# Patient Record
Sex: Male | Born: 1965 | Race: White | Hispanic: No | Marital: Single | Smoking: Never smoker
Health system: Southern US, Community
[De-identification: ages and names within clinical notes are randomized; demographics above are authoritative.]

## PROBLEM LIST (undated history)

## (undated) DIAGNOSIS — C801 Malignant (primary) neoplasm, unspecified: Secondary | ICD-10-CM

## (undated) DIAGNOSIS — E785 Hyperlipidemia, unspecified: Secondary | ICD-10-CM

## (undated) DIAGNOSIS — J349 Unspecified disorder of nose and nasal sinuses: Secondary | ICD-10-CM

## (undated) DIAGNOSIS — R400 Somnolence: Secondary | ICD-10-CM

## (undated) DIAGNOSIS — R931 Abnormal findings on diagnostic imaging of heart and coronary circulation: Secondary | ICD-10-CM

## (undated) DIAGNOSIS — M169 Osteoarthritis of hip, unspecified: Secondary | ICD-10-CM

## (undated) DIAGNOSIS — F419 Anxiety disorder, unspecified: Secondary | ICD-10-CM

## (undated) DIAGNOSIS — E119 Type 2 diabetes mellitus without complications: Secondary | ICD-10-CM

## (undated) DIAGNOSIS — R42 Dizziness and giddiness: Secondary | ICD-10-CM

## (undated) DIAGNOSIS — R030 Elevated blood-pressure reading, without diagnosis of hypertension: Secondary | ICD-10-CM

## (undated) DIAGNOSIS — E669 Obesity, unspecified: Secondary | ICD-10-CM

## (undated) DIAGNOSIS — J329 Chronic sinusitis, unspecified: Secondary | ICD-10-CM

## (undated) DIAGNOSIS — M1990 Unspecified osteoarthritis, unspecified site: Secondary | ICD-10-CM

## (undated) DIAGNOSIS — H9190 Unspecified hearing loss, unspecified ear: Secondary | ICD-10-CM

## (undated) DIAGNOSIS — K219 Gastro-esophageal reflux disease without esophagitis: Secondary | ICD-10-CM

## (undated) DIAGNOSIS — F32A Depression, unspecified: Secondary | ICD-10-CM

## (undated) DIAGNOSIS — G4719 Other hypersomnia: Secondary | ICD-10-CM

## (undated) DIAGNOSIS — G478 Other sleep disorders: Secondary | ICD-10-CM

## (undated) DIAGNOSIS — J189 Pneumonia, unspecified organism: Secondary | ICD-10-CM

## (undated) DIAGNOSIS — I10 Essential (primary) hypertension: Secondary | ICD-10-CM

## (undated) HISTORY — DX: Unspecified hearing loss, unspecified ear: H91.90

## (undated) HISTORY — DX: Elevated blood-pressure reading, without diagnosis of hypertension: R03.0

## (undated) HISTORY — PX: OTHER SURGICAL HISTORY: SHX169

## (undated) HISTORY — PX: TONSILLECTOMY AND ADENOIDECTOMY: SUR1326

## (undated) HISTORY — DX: Osteoarthritis of hip, unspecified: M16.9

## (undated) HISTORY — DX: Type 2 diabetes mellitus without complications: E11.9

## (undated) HISTORY — DX: Dizziness and giddiness: R42

## (undated) HISTORY — DX: Depression, unspecified: F32.A

## (undated) HISTORY — PX: HAND SURGERY: SHX662

## (undated) HISTORY — DX: Other hypersomnia: G47.19

## (undated) HISTORY — DX: Hyperlipidemia, unspecified: E78.5

## (undated) HISTORY — DX: Somnolence: R40.0

## (undated) HISTORY — DX: Obesity, unspecified: E66.9

## (undated) HISTORY — DX: Other sleep disorders: G47.8

## (undated) HISTORY — DX: Anxiety disorder, unspecified: F41.9

## (undated) HISTORY — DX: Essential (primary) hypertension: I10

## (undated) HISTORY — DX: Abnormal findings on diagnostic imaging of heart and coronary circulation: R93.1

## (undated) HISTORY — DX: Unspecified disorder of nose and nasal sinuses: J34.9

## (undated) HISTORY — DX: Chronic sinusitis, unspecified: J32.9

---

## 2004-02-12 HISTORY — PX: OTHER SURGICAL HISTORY: SHX169

## 2006-01-22 ENCOUNTER — Encounter: Admission: RE | Admit: 2006-01-22 | Discharge: 2006-01-22 | Payer: Self-pay | Admitting: Gastroenterology

## 2007-11-27 DIAGNOSIS — S6291XA Unspecified fracture of right wrist and hand, initial encounter for closed fracture: Secondary | ICD-10-CM | POA: Insufficient documentation

## 2008-12-06 ENCOUNTER — Encounter: Admission: RE | Admit: 2008-12-06 | Discharge: 2008-12-06 | Payer: Self-pay | Admitting: Orthopedic Surgery

## 2009-01-27 ENCOUNTER — Encounter: Admission: RE | Admit: 2009-01-27 | Discharge: 2009-01-27 | Payer: Self-pay | Admitting: Orthopedic Surgery

## 2012-08-21 ENCOUNTER — Other Ambulatory Visit: Payer: Self-pay

## 2012-08-21 ENCOUNTER — Ambulatory Visit
Admission: RE | Admit: 2012-08-21 | Discharge: 2012-08-21 | Disposition: A | Discharge status: Home / Self Care | Payer: 59 | Source: Ambulatory Visit

## 2012-08-21 DIAGNOSIS — M25551 Pain in right hip: Secondary | ICD-10-CM

## 2015-07-25 ENCOUNTER — Telehealth: Payer: Self-pay | Admitting: Cardiology

## 2015-07-25 NOTE — Telephone Encounter (Signed)
Called pt and left message for pt to call back to update Fm and medical Hx. °

## 2015-08-02 NOTE — Progress Notes (Signed)
Cardiology Office Note   Date:  08/03/2015   ID:  Michael Sandoval, DOB [birth year], MRN [id number]  PCP:  London Pepper, MD  Cardiologist:   Minus Breeding, MD   Chief Complaint  Patient presents with  . New Evaluation    neck pain  . Dizziness  . Chest Pain      History of Present Illness: Michael Sandoval is a 50 y.o. male who presents for evaluation of throat discomfort. This has been going on probably for a year or so.  It is a discomfort that is up in his throat. He feels like he is Slight choking sensation. He feels like his head is "ballooning". He might feel his pulse in his neck. It happens at the end of the day when he is tired. Doesn't seem to happen when he is active. He can do things [place] without bringing this on. He's not describing substernal chest pressure but has some tenderness small point in his left upper chest occasionally. He does get short of breath with activities but ascribes this to being out of shape. He's not describing PND or orthopnea. He's not describing palpitations, presyncope or syncope. He's had no weight gain or edema. He's had no prior cardiac workup. He does have significant cardiovascular risk factors.   Past Medical History  Diagnosis Date  . HTN (hypertension)   . Diabetes mellitus, type II ([place])   . Dyslipidemia     Past Surgical History  Procedure Laterality Date  . Hand surgery    . Tonsillectomy and adenoidectomy       Current Outpatient Prescriptions  Medication Sig Dispense Refill  . aspirin 81 MG tablet Take 81 mg by mouth daily.    Marland Kitchen esomeprazole (NEXIUM) 10 MG packet Take 10 mg by mouth daily before breakfast.    . glipiZIDE (GLUCOTROL XL) 10 MG 24 hr tablet Take 10 mg by mouth daily.    Marland Kitchen lisinopril (PRINIVIL,ZESTRIL) 10 MG tablet Take 10 mg by mouth daily.    Marland Kitchen loratadine (CLARITIN) 10 MG tablet Take 10 mg by mouth daily as needed for allergies.    . metFORMIN (GLUCOPHAGE)  1000 MG tablet Take 100 mg by mouth 2 (two) times daily.     No current facility-administered medications for this visit.    Allergies:   Review of patient's allergies indicates no known allergies.    Social History:  The patient  reports that he has never smoked. He has never used smokeless tobacco. He reports that he drinks alcohol. He reports that he does not use illicit drugs.   Family History:  The patient's family history includes Heart attack (age of onset: 28) in his mother; Hypertension in his father.    ROS:  Please see the history of present illness.   Otherwise, review of systems are positive for none.   All other systems are reviewed and negative.    PHYSICAL EXAM: VS:  BP 130/86 mmHg  Pulse 84  Ht 5\' 11"  (1.803 m)  Wt 288 lb (130.636 kg)  BMI 40.19 kg/m2 , BMI Body mass index is 40.19 kg/(m^2). GENERAL:  Well appearing HEENT:  Pupils equal round and reactive, fundi not visualized, oral mucosa unremarkable NECK:  No jugular venous distention, waveform within normal limits, carotid upstroke brisk and symmetric, no bruits, no thyromegaly LYMPHATICS:  No cervical, inguinal adenopathy LUNGS:  Clear to auscultation bilaterally BACK:  No CVA tenderness CHEST:  Unremarkable HEART:  PMI not displaced or sustained,S1 and S2 within normal limits, no S3, no S4, no clicks, no rubs, no murmurs ABD:  Flat, positive bowel sounds normal in frequency in pitch, no bruits, no rebound, no guarding, no midline pulsatile mass, no hepatomegaly, no splenomegaly EXT:  2 plus pulses throughout, no edema, no cyanosis no clubbing SKIN:  No rashes no nodules NEURO:  Cranial nerves II through XII grossly intact, motor grossly intact throughout PSYCH:  Cognitively intact, oriented to person place and time    EKG:  EKG is ordered today. The ekg ordered today demonstrates sinus rhythm, rate 84, axis within normal limits, intervals within normal limits, poor anterior R wave progression, no acute  ST-T wave changes.   Recent Labs: No results found for requested labs within last 365 days.    Lipid Panel No results found for: CHOL, TRIG, HDL, CHOLHDL, VLDL, LDLCALC, LDLDIRECT    Wt Readings from Last 3 Encounters:  08/03/15 288 lb (130.636 kg)      Other studies Reviewed: Additional studies/ records that were reviewed today include: Office records. Review of the above records demonstrates:  Please see elsewhere in the note.     ASSESSMENT AND PLAN:  THROAT PAIN/SOB:  The patient has significant cardiovascular risk factors. I will bring the patient back for a POET (Plain Old Exercise Test). This will allow me to screen for obstructive coronary disease, risk stratify and very importantly provide a prescription for exercise.  I will also order coronary calcium score.  DM: Records reveal that his A1c is 8.7. We talked at length about lifestyle changes for management of this and the importance of continued follow-up.  OBESITY:  The patient understands the need to lose weight with diet and exercise. We have discussed specific strategies for this.  HTN:  The blood pressure is at target. No change in medications is indicated. We will continue with therapeutic lifestyle changes (TLC).  DYSLIPIDEMIA:  I will consider further suggestions about medical therapies for this based on the results of his coronary calcium score. I don't see a lipid profile with his outside records today. Given his diabetes is goal LDL at least less than   Current medicines are reviewed at length with the patient today.  The patient does not have concerns regarding medicines.  The following changes have been made:  no change  Labs/ tests ordered today include:   Orders Placed This Encounter  Procedures  . CT CARDIAC SCORING  . EXERCISE TOLERANCE TEST  . EKG 12-Lead     Disposition:   FU with me as needed.     Signed, Minus Breeding, MD  08/03/2015 8:59 AM    [facility]

## 2015-08-03 ENCOUNTER — Encounter: Payer: Self-pay | Admitting: Cardiology

## 2015-08-03 ENCOUNTER — Ambulatory Visit (INDEPENDENT_AMBULATORY_CARE_PROVIDER_SITE_OTHER): Payer: 59 | Admitting: Cardiology

## 2015-08-03 VITALS — BP 130/86 | HR 84 | Ht 71.0 in | Wt 288.0 lb

## 2015-08-03 DIAGNOSIS — M542 Cervicalgia: Secondary | ICD-10-CM

## 2015-08-03 DIAGNOSIS — R0602 Shortness of breath: Secondary | ICD-10-CM

## 2015-08-03 NOTE — Patient Instructions (Signed)
Medication Instructions:  Continue current medications  Labwork: NONE  Testing/Procedures: Your physician has requested that you have an exercise tolerance test. For further information please visit [email or web address]. Please also follow instruction sheet, as given.  Your physician has requested that you have a Coronary Calcium Score Test this test is done at our [address] office  Follow-Up: Your physician recommends that you schedule a follow-up appointment in: As Needed   Any Other Special Instructions Will Be Listed Below (If Applicable).   If you need a refill on your cardiac medications before your next appointment, please call your pharmacy.

## 2015-08-17 ENCOUNTER — Ambulatory Visit (HOSPITAL_COMMUNITY)
Admission: RE | Admit: 2015-08-17 | Discharge: 2015-08-17 | Disposition: A | Discharge status: Home / Self Care | Payer: 59 | Source: Ambulatory Visit | Attending: Cardiology | Admitting: Cardiology

## 2015-08-17 ENCOUNTER — Ambulatory Visit (INDEPENDENT_AMBULATORY_CARE_PROVIDER_SITE_OTHER)
Admission: RE | Admit: 2015-08-17 | Discharge: 2015-08-17 | Disposition: A | Discharge status: Home / Self Care | Payer: 59 | Source: Ambulatory Visit | Attending: Cardiology | Admitting: Cardiology

## 2015-08-17 DIAGNOSIS — R0602 Shortness of breath: Secondary | ICD-10-CM

## 2015-08-17 LAB — EXERCISE TOLERANCE TEST
Estimated workload: 7 METS
Exercise duration (min): 5 min
Exercise duration (sec): 59 s
MPHR: 168 {beats}/min
Peak HR: 162 {beats}/min
Percent HR: 96 %
Rest HR: 101 {beats}/min

## 2015-09-04 ENCOUNTER — Encounter: Payer: Self-pay | Admitting: Podiatry

## 2015-09-04 ENCOUNTER — Ambulatory Visit (INDEPENDENT_AMBULATORY_CARE_PROVIDER_SITE_OTHER): Payer: 59 | Admitting: Podiatry

## 2015-09-04 VITALS — BP 124/79 | HR 70 | Resp 16 | Ht 71.0 in | Wt 280.0 lb

## 2015-09-04 DIAGNOSIS — B351 Tinea unguium: Secondary | ICD-10-CM

## 2015-09-04 DIAGNOSIS — L600 Ingrowing nail: Secondary | ICD-10-CM

## 2015-09-04 NOTE — Progress Notes (Signed)
   Subjective:    Patient ID: Michael Sandoval, male    DOB: 05-09-1965, 50 y.o.   MRN: [id number]  HPI Chief Complaint  Patient presents with  . Nail Problem    Left foot; great toe; nail discoloration & thickened nail; pt stated, "Toe feels numb like it is asleep"; Pt diabetic type 2; sugar=did not check today; A1C=8.7      Review of Systems  Constitutional: Positive for diaphoresis and fatigue.  HENT: Positive for hearing loss.   All other systems reviewed and are negative.      Objective:   Physical Exam        Assessment & Plan:

## 2015-09-06 NOTE — Progress Notes (Signed)
Subjective:     Patient ID: Michael Sandoval, male   DOB: 30-May-1965, 50 y.o.   MRN: [id number]  HPI patient presents stating she's having trouble with his left big nail and he gets sore and makes it hard for him at times to wear proper shoe gear   Review of Systems  All other systems reviewed and are negative.      Objective:   Physical Exam  Constitutional: He is oriented to person, place, and time.  Cardiovascular: Intact distal pulses.   Musculoskeletal: Normal range of motion.  Neurological: He is oriented to person, place, and time.  Skin: Skin is warm.  Nursing note and vitals reviewed.  neurovascular status found to be intact with muscle strength adequate range of motion within normal limits. Patient's found to have a thickened left hallux nail localized in nature with no indications of proximal spread     Assessment:     Damaged left hallux nail with thickness    Plan:     Advised on debridement of nailbed along with topical medication and reappoint depending on how this does and we may have to consider correction of ingrown toenail

## 2015-09-08 ENCOUNTER — Ambulatory Visit: Payer: 59 | Admitting: Podiatry

## 2016-03-21 DIAGNOSIS — Z7984 Long term (current) use of oral hypoglycemic drugs: Secondary | ICD-10-CM | POA: Diagnosis not present

## 2016-03-21 DIAGNOSIS — Z125 Encounter for screening for malignant neoplasm of prostate: Secondary | ICD-10-CM | POA: Diagnosis not present

## 2016-03-21 DIAGNOSIS — E785 Hyperlipidemia, unspecified: Secondary | ICD-10-CM | POA: Diagnosis not present

## 2016-03-21 DIAGNOSIS — E119 Type 2 diabetes mellitus without complications: Secondary | ICD-10-CM | POA: Diagnosis not present

## 2016-03-22 DIAGNOSIS — Z0000 Encounter for general adult medical examination without abnormal findings: Secondary | ICD-10-CM | POA: Diagnosis not present

## 2016-04-16 DIAGNOSIS — I10 Essential (primary) hypertension: Secondary | ICD-10-CM | POA: Diagnosis not present

## 2016-04-16 DIAGNOSIS — E1165 Type 2 diabetes mellitus with hyperglycemia: Secondary | ICD-10-CM | POA: Diagnosis not present

## 2016-04-16 DIAGNOSIS — E7800 Pure hypercholesterolemia, unspecified: Secondary | ICD-10-CM | POA: Diagnosis not present

## 2016-05-27 DIAGNOSIS — E1165 Type 2 diabetes mellitus with hyperglycemia: Secondary | ICD-10-CM | POA: Diagnosis not present

## 2016-05-27 DIAGNOSIS — I10 Essential (primary) hypertension: Secondary | ICD-10-CM | POA: Diagnosis not present

## 2016-05-27 DIAGNOSIS — E7800 Pure hypercholesterolemia, unspecified: Secondary | ICD-10-CM | POA: Diagnosis not present

## 2016-07-22 DIAGNOSIS — R197 Diarrhea, unspecified: Secondary | ICD-10-CM | POA: Diagnosis not present

## 2016-09-09 DIAGNOSIS — I10 Essential (primary) hypertension: Secondary | ICD-10-CM | POA: Diagnosis not present

## 2016-09-09 DIAGNOSIS — E7800 Pure hypercholesterolemia, unspecified: Secondary | ICD-10-CM | POA: Diagnosis not present

## 2016-09-09 DIAGNOSIS — E1165 Type 2 diabetes mellitus with hyperglycemia: Secondary | ICD-10-CM | POA: Diagnosis not present

## 2016-12-24 DIAGNOSIS — Z23 Encounter for immunization: Secondary | ICD-10-CM | POA: Diagnosis not present

## 2016-12-24 DIAGNOSIS — J329 Chronic sinusitis, unspecified: Secondary | ICD-10-CM | POA: Diagnosis not present

## 2017-01-10 DIAGNOSIS — E1165 Type 2 diabetes mellitus with hyperglycemia: Secondary | ICD-10-CM | POA: Diagnosis not present

## 2017-01-10 DIAGNOSIS — E7800 Pure hypercholesterolemia, unspecified: Secondary | ICD-10-CM | POA: Diagnosis not present

## 2018-03-06 ENCOUNTER — Encounter: Payer: Self-pay | Admitting: Podiatry

## 2018-03-06 ENCOUNTER — Ambulatory Visit (INDEPENDENT_AMBULATORY_CARE_PROVIDER_SITE_OTHER): Payer: 59 | Admitting: Podiatry

## 2018-03-06 VITALS — Resp 16

## 2018-03-06 DIAGNOSIS — L600 Ingrowing nail: Secondary | ICD-10-CM | POA: Diagnosis not present

## 2018-03-06 MED ORDER — NEOMYCIN-POLYMYXIN-HC 3.5-10000-1 OT SOLN: OTIC | 0 refills | Status: DC

## 2018-03-06 NOTE — Progress Notes (Signed)
Subjective:   Patient ID: Michael Sandoval, male   DOB: 53 y.o.   MRN: [id number]   HPI Patient presents stating that his nail has become more chronically ingrown on the side he tries to trim it and over the last month there is been some drainage of pus coming.  He is used peroxide on it and states his sugar has been doing reasonably well and thinks his last A1c was between 8 and 8.5.   ROS      Objective:  Physical Exam  Neurovascular status was found to be intact with patient found to have good digital perfusion and patient does have an incurvated lateral border of the left hallux with thickness of the nail which is been present for a number of years.  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  I did explain procedure and risk and I allowed him to sign consent form and we did discuss healing associated with diabetes and with good circulatory status this should heal over time and again my concerns about him cutting into the nailbed himself.  Patient wants procedure and today I infiltrated the left hallux 60 mg like Marcaine mixture sterile prep applied to the toe and using sterile instrumentation I remove the lateral border exposed matrix and applied phenol 3 applications 30 seconds followed by alcohol lavage and sterile dressing.  Gave instructions on soaks and instructed to leave dressing on for 24 hours but to take it off earlier if it should start to throb.  Wrote prescription for Corticosporin otic solution to use after soaks and instructed to call with any questions and will be checked  back 2 weeks or earlier if needed

## 2018-03-06 NOTE — Patient Instructions (Signed)

## 2018-03-20 ENCOUNTER — Other Ambulatory Visit: Payer: Self-pay

## 2018-03-23 ENCOUNTER — Ambulatory Visit: Payer: Self-pay

## 2018-03-23 DIAGNOSIS — L600 Ingrowing nail: Secondary | ICD-10-CM

## 2018-03-23 NOTE — Patient Instructions (Signed)

## 2018-03-24 NOTE — Progress Notes (Signed)
Patient is here today for follow-up appointment, recent procedure performed on 03/06/2018, removal of ingrown left hallux nail border.  He states that the areas of little sensitive, but overall is not causing him any pain at this time.  No redness, no swelling, no erythema, no drainage, no other signs and symptoms of infection.  The area is scabbing over at this time and healing well.  Discussed signs and symptoms of infection, verbal and written instructions were given to the patient.  He is to follow-up as needed with any acute symptom changes.

## 2018-04-24 DIAGNOSIS — E785 Hyperlipidemia, unspecified: Secondary | ICD-10-CM | POA: Diagnosis not present

## 2018-04-24 DIAGNOSIS — Z0000 Encounter for general adult medical examination without abnormal findings: Secondary | ICD-10-CM | POA: Diagnosis not present

## 2018-04-24 DIAGNOSIS — E1169 Type 2 diabetes mellitus with other specified complication: Secondary | ICD-10-CM | POA: Diagnosis not present

## 2018-04-24 DIAGNOSIS — R42 Dizziness and giddiness: Secondary | ICD-10-CM | POA: Diagnosis not present

## 2018-04-24 DIAGNOSIS — R030 Elevated blood-pressure reading, without diagnosis of hypertension: Secondary | ICD-10-CM | POA: Diagnosis not present

## 2018-04-24 DIAGNOSIS — E1165 Type 2 diabetes mellitus with hyperglycemia: Secondary | ICD-10-CM | POA: Diagnosis not present

## 2018-04-24 DIAGNOSIS — Z125 Encounter for screening for malignant neoplasm of prostate: Secondary | ICD-10-CM | POA: Diagnosis not present

## 2018-04-27 DIAGNOSIS — M1611 Unilateral primary osteoarthritis, right hip: Secondary | ICD-10-CM | POA: Diagnosis not present

## 2018-06-05 DIAGNOSIS — E7800 Pure hypercholesterolemia, unspecified: Secondary | ICD-10-CM | POA: Diagnosis not present

## 2018-06-05 DIAGNOSIS — E1165 Type 2 diabetes mellitus with hyperglycemia: Secondary | ICD-10-CM | POA: Diagnosis not present

## 2018-06-05 DIAGNOSIS — I10 Essential (primary) hypertension: Secondary | ICD-10-CM | POA: Diagnosis not present

## 2018-06-24 NOTE — Progress Notes (Signed)
Need orders for 5-28 surgery

## 2018-06-25 DIAGNOSIS — E1169 Type 2 diabetes mellitus with other specified complication: Secondary | ICD-10-CM | POA: Diagnosis not present

## 2018-06-25 DIAGNOSIS — M25559 Pain in unspecified hip: Secondary | ICD-10-CM | POA: Diagnosis not present

## 2018-06-25 DIAGNOSIS — Z01818 Encounter for other preprocedural examination: Secondary | ICD-10-CM | POA: Diagnosis not present

## 2018-06-27 ENCOUNTER — Ambulatory Visit: Payer: Self-pay | Admitting: Orthopedic Surgery

## 2018-06-29 NOTE — Patient Instructions (Addendum)
Michael Sandoval  06/29/2018   YOU ARE REQUIRED TO BE TESTED FOR COVID-19 PRIOR TO YOUR SURGERY . YOUR TEST MUST BE COMPLETED ON Monday Jul 06, 2018. TESTING IS LOCATED AT [place] ENTRANCE FROM 9:00AM - 3:00PM. FAILURE TO COMPLETE TESTING MAY RESULT IN CANCELLATION OF YOUR SURGERY.   _________________________________________________________________________    Your procedure is scheduled on: 07-09-2018   Report to [facility] Main  Entrance    Report to [place] at 5:30AM      Call this number if you have problems the morning of surgery [phone]     Do not eat food After Midnight. YOU MAY HAVE CLEAR LIQUIDS FROM MIDNIGHT UNTIL 4:30AM . At 4:30AM Please finish the prescribed Pre-Surgery Gatorade drink. Nothing by mouth after you finish the Gatorade drink !   After Midnight. BRUSH YOUR TEETH MORNING OF SURGERY AND RINSE YOUR MOUTH OUT, NO CHEWING GUM CANDY OR MINTS.     CLEAR LIQUID DIET   Foods Allowed                                                                     Foods Excluded  Coffee and tea, regular and decaf                             liquids that you cannot  Plain Jell-O in any flavor                                             see through such as: Fruit ices (not with fruit pulp)                                     milk, soups, orange juice  Iced Popsicles                                    All solid food Carbonated beverages, regular and diet                                    Cranberry, grape and apple juices Sports drinks like Gatorade Lightly seasoned clear broth or consume(fat free) Sugar, honey syrup  Sample Menu Breakfast                                Lunch                                     Supper Cranberry juice                    Beef broth  Chicken broth Jell-O                                     Grape juice                           Apple juice Coffee or tea                         Jell-O                                      Popsicle                                                Coffee or tea                        Coffee or tea  _____________________________________________________________________       Take these medicines the morning of surgery with A SIP OF WATER: Zyrtec if needed, Esomeprzole if needed, Loratadine If needed, Flonase if needed   DO NOT TAKE ANY DIABETES MEDICATION THE DAY OF SURGERY! PLEASE CHECK YOUR BLOOD SUGAR THE DAY OF SURGERY. REPORT TO YOUR NURSE ON ARRIVAL.  IF YOUR BLOOD SUGAR IS LESS THAN 70 THE MORNING SURGERY WHEN YOU CHECK IT AT HOME, YOU NEED TO CALL THE [phone] FOR FURTHER INSTRUCTIONS!                                You may not have any metal on your body including hair pins and              piercings  Do not wear jewelry, make-up, lotions, powders or perfumes, deodorant                      Men may shave face and neck.   Do not bring valuables to the hospital. [place].  Contacts, dentures or bridgework may not be worn into surgery.               Please read over the following fact sheets you were given: _____________________________________________________________________             [facility] - Preparing for Surgery Before surgery, you can play an important role.  Because skin is not sterile, your skin needs to be as free of germs as possible.  You can reduce the number of germs on your skin by washing with CHG (chlorahexidine gluconate) soap before surgery.  CHG is an antiseptic cleaner which kills germs and bonds with the skin to continue killing germs even after washing. Please DO NOT use if you have an allergy to CHG or antibacterial soaps.  If your skin becomes reddened/irritated stop using the CHG and inform your nurse when you arrive at Short Stay. Do not shave (including legs and underarms) for at least 48 hours prior to  the first CHG shower.  You may  shave your face/neck. Please follow these instructions carefully:  1.  Shower with CHG Soap the night before surgery and the  morning of Surgery.  2.  If you choose to wash your hair, wash your hair first as usual with your  normal  shampoo.  3.  After you shampoo, rinse your hair and body thoroughly to remove the  shampoo.                           4.  Use CHG as you would any other liquid soap.  You can apply chg directly  to the skin and wash                       Gently with a scrungie or clean washcloth.  5.  Apply the CHG Soap to your body ONLY FROM THE NECK DOWN.   Do not use on face/ open                           Wound or open sores. Avoid contact with eyes, ears mouth and genitals (private parts).                       Wash face,  Genitals (private parts) with your normal soap.             6.  Wash thoroughly, paying special attention to the area where your surgery  will be performed.  7.  Thoroughly rinse your body with warm water from the neck down.  8.  DO NOT shower/wash with your normal soap after using and rinsing off  the CHG Soap.                9.  Pat yourself dry with a clean towel.            10.  Wear clean pajamas.            11.  Place clean sheets on your bed the night of your first shower and do not  sleep with pets. Day of Surgery : Do not apply any lotions/deodorants the morning of surgery.  Please wear clean clothes to the hospital/surgery center.  FAILURE TO FOLLOW THESE INSTRUCTIONS MAY RESULT IN THE CANCELLATION OF YOUR SURGERY PATIENT SIGNATURE_________________________________  NURSE SIGNATURE__________________________________  ________________________________________________________________________   Michael Sandoval  An incentive spirometer is a tool that can help keep your lungs clear and active. This tool measures how well you are filling your lungs with each breath. Taking long deep breaths may help reverse or  decrease the chance of developing breathing (pulmonary) problems (especially infection) following:  A long period of time when you are unable to move or be active. BEFORE THE PROCEDURE   If the spirometer includes an indicator to show your best effort, your nurse or respiratory therapist will set it to a desired goal.  If possible, sit up straight or lean slightly forward. Try not to slouch.  Hold the incentive spirometer in an upright position. INSTRUCTIONS FOR USE  1. Sit on the edge of your bed if possible, or sit up as far as you can in bed or on a chair. 2. Hold the incentive spirometer in an upright position. 3. Breathe out normally. 4. Place the mouthpiece in your mouth and seal your lips tightly around it. 5. Breathe in slowly and as deeply as possible, raising the piston  or the ball toward the top of the column. 6. Hold your breath for 3-5 seconds or for as long as possible. Allow the piston or ball to fall to the bottom of the column. 7. Remove the mouthpiece from your mouth and breathe out normally. 8. Rest for a few seconds and repeat Steps 1 through 7 at least 10 times every 1-2 hours when you are awake. Take your time and take a few normal breaths between deep breaths. 9. The spirometer may include an indicator to show your best effort. Use the indicator as a goal to work toward during each repetition. 10. After each set of 10 deep breaths, practice coughing to be sure your lungs are clear. If you have an incision (the cut made at the time of surgery), support your incision when coughing by placing a pillow or rolled up towels firmly against it. Once you are able to get out of bed, walk around indoors and cough well. You may stop using the incentive spirometer when instructed by your caregiver.  RISKS AND COMPLICATIONS  Take your time so you do not get dizzy or light-headed.  If you are in pain, you may need to take or ask for pain medication before doing incentive spirometry.  It is harder to take a deep breath if you are having pain. AFTER USE  Rest and breathe slowly and easily.  It can be helpful to keep track of a log of your progress. Your caregiver can provide you with a simple table to help with this. If you are using the spirometer at home, follow these instructions: [place] IF:   You are having difficultly using the spirometer.  You have trouble using the spirometer as often as instructed.  Your pain medication is not giving enough relief while using the spirometer.  You develop fever of 100.5 F (38.1 C) or higher. SEEK IMMEDIATE MEDICAL CARE IF:   You cough up bloody sputum that had not been present before.  You develop fever of 102 F (38.9 C) or greater.  You develop worsening pain at or near the incision site. MAKE SURE YOU:   Understand these instructions.  Will watch your condition.  Will get help right away if you are not doing well or get worse. Document Released: 06/10/2006 Document Revised: 04/22/2011 Document Reviewed: 08/11/2006 Exit[employer] Patient Information 2014 Exit[employer], [place].   ________________________________________________________________________  WHAT IS A BLOOD TRANSFUSION? Blood Transfusion Information  A transfusion is the replacement of blood or some of its parts. Blood is made up of multiple cells which provide different functions.  Red blood cells carry oxygen and are used for blood loss replacement.  White blood cells fight against infection.  Platelets control bleeding.  Plasma helps clot blood.  Other blood products are available for specialized needs, such as hemophilia or other clotting disorders. BEFORE THE TRANSFUSION  Who gives blood for transfusions?   Healthy volunteers who are fully evaluated to make sure their blood is safe. This is blood bank blood. Transfusion therapy is the safest it has ever been in the practice of medicine. Before blood is taken from a donor, a complete  history is taken to make sure that person has no history of diseases nor engages in risky social behavior (examples are intravenous drug use or sexual activity with multiple partners). The donor's travel history is screened to minimize risk of transmitting infections, such as malaria. The donated blood is tested for signs of infectious diseases, such as HIV and hepatitis.  The blood is then tested to be sure it is compatible with you in order to minimize the chance of a transfusion reaction. If you or a relative donates blood, this is often done in anticipation of surgery and is not appropriate for emergency situations. It takes many days to process the donated blood. RISKS AND COMPLICATIONS Although transfusion therapy is very safe and saves many lives, the main dangers of transfusion include:   Getting an infectious disease.  Developing a transfusion reaction. This is an allergic reaction to something in the blood you were given. Every precaution is taken to prevent this. The decision to have a blood transfusion has been considered carefully by your caregiver before blood is given. Blood is not given unless the benefits outweigh the risks. AFTER THE TRANSFUSION  Right after receiving a blood transfusion, you will usually feel much better and more energetic. This is especially true if your red blood cells have gotten low (anemic). The transfusion raises the level of the red blood cells which carry oxygen, and this usually causes an energy increase.  The nurse administering the transfusion will monitor you carefully for complications. HOME CARE INSTRUCTIONS  No special instructions are needed after a transfusion. You may find your energy is better. Speak with your caregiver about any limitations on activity for underlying diseases you may have. SEEK MEDICAL CARE IF:   Your condition is not improving after your transfusion.  You develop redness or irritation at the intravenous (IV) site. SEEK  IMMEDIATE MEDICAL CARE IF:  Any of the following symptoms occur over the next 12 hours:  Shaking chills.  You have a temperature by mouth above 102 F (38.9 C), not controlled by medicine.  Chest, back, or muscle pain.  People around you feel you are not acting correctly or are confused.  Shortness of breath or difficulty breathing.  Dizziness and fainting.  You get a rash or develop hives.  You have a decrease in urine output.  Your urine turns a dark color or changes to pink, red, or brown. Any of the following symptoms occur over the next 10 days:  You have a temperature by mouth above 102 F (38.9 C), not controlled by medicine.  Shortness of breath.  Weakness after normal activity.  The white part of the eye turns yellow (jaundice).  You have a decrease in the amount of urine or are urinating less often.  Your urine turns a dark color or changes to pink, red, or brown. Document Released: 01/26/2000 Document Revised: 04/22/2011 Document Reviewed: 09/14/2007 [facility] Patient Information 2014 [place], [place].  _______________________________________________________________________

## 2018-06-29 NOTE — Progress Notes (Signed)
SPOKE W/  _patient   Denies the following below    SCREENING SYMPTOMS OF COVID 19:   COUGH--  RUNNY NOSE---   SORE THROAT---  NASAL CONGESTION----  SNEEZING----  SHORTNESS OF BREATH---  DIFFICULTY BREATHING---  TEMP >100.0 -----  UNEXPLAINED BODY ACHES------  CHILLS --------   HEADACHES ---------  LOSS OF SMELL/ TASTE --------    HAVE YOU OR ANY FAMILY MEMBER TRAVELLED PAST 14 DAYS OUT OF THE   COUNTY--- STATE---- COUNTRY----  HAVE YOU OR ANY FAMILY MEMBER BEEN EXPOSED TO ANYONE WITH COVID 19?    SPOKE W/  _     SCREENING SYMPTOMS OF COVID 19:   COUGH--  RUNNY NOSE---   SORE THROAT---slight  NASAL CONGESTION----  SNEEZING----occ  SHORTNESS OF BREATH---  DIFFICULTY BREATHING---  TEMP >100.0 -----  UNEXPLAINED BODY ACHES------  CHILLS --------   HEADACHES ---------  LOSS OF SMELL/ TASTE --------    HAVE YOU OR ANY FAMILY MEMBER TRAVELLED PAST 14 DAYS OUT OF THE   COUNTY--- STATE---- COUNTRY----  HAVE YOU OR ANY FAMILY MEMBER BEEN EXPOSED TO ANYONE WITH COVID 19?

## 2018-06-29 NOTE — Progress Notes (Signed)
lov cards Hochrein 2017 epic    Stress test 2017 epic

## 2018-06-30 ENCOUNTER — Ambulatory Visit: Payer: Self-pay | Admitting: Orthopedic Surgery

## 2018-06-30 ENCOUNTER — Encounter (HOSPITAL_COMMUNITY)
Admission: RE | Admit: 2018-06-30 | Discharge: 2018-06-30 | Disposition: A | Discharge status: Home / Self Care | Payer: 59 | Source: Ambulatory Visit | Attending: Orthopedic Surgery | Admitting: Orthopedic Surgery

## 2018-06-30 ENCOUNTER — Encounter (HOSPITAL_COMMUNITY): Payer: Self-pay

## 2018-06-30 ENCOUNTER — Other Ambulatory Visit: Payer: Self-pay

## 2018-06-30 DIAGNOSIS — Z01812 Encounter for preprocedural laboratory examination: Secondary | ICD-10-CM | POA: Insufficient documentation

## 2018-06-30 DIAGNOSIS — M1611 Unilateral primary osteoarthritis, right hip: Secondary | ICD-10-CM | POA: Diagnosis not present

## 2018-06-30 HISTORY — DX: Gastro-esophageal reflux disease without esophagitis: K21.9

## 2018-06-30 HISTORY — DX: Unspecified osteoarthritis, unspecified site: M19.90

## 2018-06-30 HISTORY — DX: Pneumonia, unspecified organism: J18.9

## 2018-06-30 HISTORY — DX: Malignant (primary) neoplasm, unspecified: C80.1

## 2018-06-30 LAB — CBC
HCT: 45.9 % (ref 39.0–52.0)
Hemoglobin: 14.7 g/dL (ref 13.0–17.0)
MCH: 30.3 pg (ref 26.0–34.0)
MCHC: 32 g/dL (ref 30.0–36.0)
MCV: 94.6 fL (ref 80.0–100.0)
Platelets: 279 10*3/uL (ref 150–400)
RBC: 4.85 MIL/uL (ref 4.22–5.81)
RDW: 13.8 % (ref 11.5–15.5)
WBC: 12.6 10*3/uL — ABNORMAL HIGH (ref 4.0–10.5)
nRBC: 0 % (ref 0.0–0.2)

## 2018-06-30 LAB — BASIC METABOLIC PANEL
Anion gap: 11 (ref 5–15)
BUN: 22 mg/dL — ABNORMAL HIGH (ref 6–20)
CO2: 23 mmol/L (ref 22–32)
Calcium: 9.2 mg/dL (ref 8.9–10.3)
Chloride: 102 mmol/L (ref 98–111)
Creatinine, Ser: 0.89 mg/dL (ref 0.61–1.24)
GFR calc Af Amer: >60 mL/min (ref >60)
GFR calc non Af Amer: >60 mL/min (ref >60)
Glucose, Bld: 110 mg/dL — ABNORMAL HIGH (ref 70–99)
Potassium: 4.9 mmol/L (ref 3.5–5.1)
Sodium: 136 mmol/L (ref 135–145)

## 2018-06-30 LAB — SURGICAL PCR SCREEN
MRSA, PCR: NEGATIVE
Staphylococcus aureus: POSITIVE — AB

## 2018-06-30 LAB — ABO/RH: ABO/RH(D): O POS

## 2018-06-30 LAB — URINALYSIS, ROUTINE W REFLEX MICROSCOPIC
Bacteria, UA: NONE SEEN
Bilirubin Urine: NEGATIVE
Glucose, UA: >=500 mg/dL — AB
Hgb urine dipstick: NEGATIVE
Ketones, ur: 20 mg/dL — AB
Leukocytes,Ua: NEGATIVE
Nitrite: NEGATIVE
Protein, ur: NEGATIVE mg/dL
Specific Gravity, Urine: 1.015 (ref 1.005–1.030)
pH: 5 (ref 5.0–8.0)

## 2018-06-30 LAB — HEMOGLOBIN A1C
Hgb A1c MFr Bld: 6.4 % — ABNORMAL HIGH (ref 4.8–5.6)
Mean Plasma Glucose: 136.98 mg/dL

## 2018-06-30 LAB — PROTIME-INR
INR: 0.9 (ref 0.8–1.2)
Prothrombin Time: 12.5 seconds (ref 11.4–15.2)

## 2018-06-30 LAB — GLUCOSE, CAPILLARY: Glucose-Capillary: 106 mg/dL — ABNORMAL HIGH (ref 70–99)

## 2018-06-30 NOTE — H&P (View-Only) (Signed)
TOTAL HIP ADMISSION H&P  Patient is admitted for right total hip arthroplasty.  Subjective:  Chief Complaint: right hip pain  HPI: Michael Sandoval, 53 y.o. male, has a history of pain and functional disability in the right hip(s) due to arthritis and patient has failed non-surgical conservative treatments for greater than 12 weeks to include NSAID's and/or analgesics, flexibility and strengthening excercises, use of assistive devices, weight reduction as appropriate and activity modification.  Onset of symptoms was gradual starting 2 years ago with rapidlly worsening course since that time.The patient noted no past surgery on the right hip(s).  Patient currently rates pain in the right hip at 10 out of 10 with activity. Patient has night pain, worsening of pain with activity and weight bearing, trendelenberg gait, pain that interfers with activities of daily living, pain with passive range of motion and crepitus. Patient has evidence of subchondral cysts, subchondral sclerosis, periarticular osteophytes and joint space narrowing by imaging studies. This condition presents safety issues increasing the risk of falls.   There is no current active infection.  There are no active problems to display for this patient.  Past Medical History:  Diagnosis Date  . Arthritis   . Cancer ([place])    melanoma skin cancer   . Diabetes mellitus, type II ([place])       . Dyslipidemia   . GERD (gastroesophageal reflux disease)   . HTN (hypertension)   . Pneumonia    1990     Past Surgical History:  Procedure Laterality Date  . colonoscopy   2006  . HAND SURGERY     crush injury , small screw in place per surgeon   . skin cancer   several years ago  . TONSILLECTOMY AND ADENOIDECTOMY      Current Outpatient Medications  Medication Sig Dispense Refill Last Dose  . aspirin 81 MG tablet Take 81 mg by mouth daily.   Taking  . cetirizine (ZYRTEC) 10 MG tablet Take 10 mg by mouth as needed for allergies.   Taking   . esomeprazole (NEXIUM) 20 MG capsule Take 20 mg by mouth daily as needed (HEARTBURN).    Taking  . fluticasone (FLONASE) 50 MCG/ACT nasal spray Place 1 spray into both nostrils daily.     Marland Kitchen glipiZIDE (GLUCOTROL XL) 10 MG 24 hr tablet Take 10 mg by mouth every evening.    Taking  . glucose blood test strip 1 each by Other route as needed for other. Use as instructed One Touch Verio (Blood Glucose Test) One Touch Delica Lancets 14E   Taking  . ibuprofen (ADVIL,MOTRIN) 200 MG tablet Take 400 mg by mouth every 6 (six) hours as needed (PAIN).    Taking  . lisinopril (PRINIVIL,ZESTRIL) 10 MG tablet Take 10 mg by mouth daily.   Taking  . loratadine (CLARITIN) 10 MG tablet Take 10 mg by mouth daily as needed for allergies.   Taking  . Multiple Vitamins-Minerals (MULTIVITAMIN WITH MINERALS) tablet Take 1 tablet by mouth daily.     Marland Kitchen XIGDUO XR 5-500 MG TB24 Take 1 tablet by mouth 2 (two) times a day.    Taking   No current facility-administered medications for this visit.    No Known Allergies  Social History   Tobacco Use  . Smoking status: Never Smoker  . Smokeless tobacco: Never Used  Substance Use Topics  . Alcohol use: Not Currently    Family History  Problem Relation Age of Onset  . Heart attack Mother 81  .  Hypertension Father      Review of Systems  Constitutional: Negative.   HENT: Positive for tinnitus.   Eyes: Negative.   Respiratory: Negative.   Cardiovascular: Negative.   Gastrointestinal: Negative.   Genitourinary: Negative.   Musculoskeletal: Positive for back pain and joint pain.  Skin: Negative.   Neurological: Negative.   Endo/Heme/Allergies: Positive for environmental allergies.    Objective:  Physical Exam  Vitals reviewed. Constitutional: He is oriented to person, place, and time. He appears well-developed and well-nourished.  HENT:  Head: Normocephalic and atraumatic.  Eyes: Pupils are equal, round, and reactive to light. Conjunctivae and EOM are  normal.  Neck: Normal range of motion. Neck supple.  Cardiovascular: Normal rate, regular rhythm and intact distal pulses.  Respiratory: Effort normal. No respiratory distress.  GI: Soft. He exhibits no distension.  Genitourinary:    Genitourinary Comments: deferred   Musculoskeletal:     Right hip: He exhibits decreased range of motion, tenderness and crepitus.  Neurological: He is alert and oriented to person, place, and time. He has normal reflexes.  Skin: Skin is warm.  Psychiatric: He has a normal mood and affect. His behavior is normal. Judgment and thought content normal.    Vital signs in last 24 hours: @VSRANGES @  Labs:   Estimated body mass index is 39.16 kg/m as calculated from the following:   Height as of an earlier encounter on 06/30/18: 5\' 9"  (1.753 m).   Weight as of an earlier encounter on 06/30/18: 120.3 kg.   Imaging Review Plain radiographs demonstrate severe degenerative joint disease of the right hip(s). The bone quality appears to be adequate for age and reported activity level.      Assessment/Plan:  End stage arthritis, right hip(s)  The patient history, physical examination, clinical judgement of the provider and imaging studies are consistent with end stage degenerative joint disease of the right hip(s) and total hip arthroplasty is deemed medically necessary. The treatment options including medical management, injection therapy, arthroscopy and arthroplasty were discussed at length. The risks and benefits of total hip arthroplasty were presented and reviewed. The risks due to aseptic loosening, infection, stiffness, dislocation/subluxation,  thromboembolic complications and other imponderables were discussed.  The patient acknowledged the explanation, agreed to proceed with the plan and consent was signed. Patient is being admitted for inpatient treatment for surgery, pain control, PT, OT, prophylactic antibiotics, VTE prophylaxis, progressive ambulation  and ADL's and discharge planning.The patient is planning to be discharged home with HEP    Patient's anticipated LOS is less than 2 midnights, meeting these requirements: - Younger than 50 - Lives within 1 hour of care - Has a competent adult at home to recover with post-op recover - NO history of  - Chronic pain requiring opiods  - Diabetes  - Coronary Artery Disease  - Heart failure  - Heart attack  - Stroke  - DVT/VTE  - Cardiac arrhythmia  - Respiratory Failure/COPD  - Renal failure  - Anemia  - Advanced Liver disease

## 2018-06-30 NOTE — H&P (Signed)
TOTAL HIP ADMISSION H&P  Patient is admitted for right total hip arthroplasty.  Subjective:  Chief Complaint: right hip pain  HPI: Michael Sandoval, 53 y.o. male, has a history of pain and functional disability in the right hip(s) due to arthritis and patient has failed non-surgical conservative treatments for greater than 12 weeks to include NSAID's and/or analgesics, flexibility and strengthening excercises, use of assistive devices, weight reduction as appropriate and activity modification.  Onset of symptoms was gradual starting 2 years ago with rapidlly worsening course since that time.The patient noted no past surgery on the right hip(s).  Patient currently rates pain in the right hip at 10 out of 10 with activity. Patient has night pain, worsening of pain with activity and weight bearing, trendelenberg gait, pain that interfers with activities of daily living, pain with passive range of motion and crepitus. Patient has evidence of subchondral cysts, subchondral sclerosis, periarticular osteophytes and joint space narrowing by imaging studies. This condition presents safety issues increasing the risk of falls.   There is no current active infection.  There are no active problems to display for this patient.  Past Medical History:  Diagnosis Date  . Arthritis   . Cancer ([place])    melanoma skin cancer   . Diabetes mellitus, type II ([place])       . Dyslipidemia   . GERD (gastroesophageal reflux disease)   . HTN (hypertension)   . Pneumonia    1990     Past Surgical History:  Procedure Laterality Date  . colonoscopy   2006  . HAND SURGERY     crush injury , small screw in place per surgeon   . skin cancer   several years ago  . TONSILLECTOMY AND ADENOIDECTOMY      Current Outpatient Medications  Medication Sig Dispense Refill Last Dose  . aspirin 81 MG tablet Take 81 mg by mouth daily.   Taking  . cetirizine (ZYRTEC) 10 MG tablet Take 10 mg by mouth as needed for allergies.   Taking   . esomeprazole (NEXIUM) 20 MG capsule Take 20 mg by mouth daily as needed (HEARTBURN).    Taking  . fluticasone (FLONASE) 50 MCG/ACT nasal spray Place 1 spray into both nostrils daily.     Marland Kitchen glipiZIDE (GLUCOTROL XL) 10 MG 24 hr tablet Take 10 mg by mouth every evening.    Taking  . glucose blood test strip 1 each by Other route as needed for other. Use as instructed One Touch Verio (Blood Glucose Test) One Touch Delica Lancets 34K   Taking  . ibuprofen (ADVIL,MOTRIN) 200 MG tablet Take 400 mg by mouth every 6 (six) hours as needed (PAIN).    Taking  . lisinopril (PRINIVIL,ZESTRIL) 10 MG tablet Take 10 mg by mouth daily.   Taking  . loratadine (CLARITIN) 10 MG tablet Take 10 mg by mouth daily as needed for allergies.   Taking  . Multiple Vitamins-Minerals (MULTIVITAMIN WITH MINERALS) tablet Take 1 tablet by mouth daily.     Marland Kitchen XIGDUO XR 5-500 MG TB24 Take 1 tablet by mouth 2 (two) times a day.    Taking   No current facility-administered medications for this visit.    No Known Allergies  Social History   Tobacco Use  . Smoking status: Never Smoker  . Smokeless tobacco: Never Used  Substance Use Topics  . Alcohol use: Not Currently    Family History  Problem Relation Age of Onset  . Heart attack Mother 30  .  Hypertension Father      Review of Systems  Constitutional: Negative.   HENT: Positive for tinnitus.   Eyes: Negative.   Respiratory: Negative.   Cardiovascular: Negative.   Gastrointestinal: Negative.   Genitourinary: Negative.   Musculoskeletal: Positive for back pain and joint pain.  Skin: Negative.   Neurological: Negative.   Endo/Heme/Allergies: Positive for environmental allergies.    Objective:  Physical Exam  Vitals reviewed. Constitutional: He is oriented to person, place, and time. He appears well-developed and well-nourished.  HENT:  Head: Normocephalic and atraumatic.  Eyes: Pupils are equal, round, and reactive to light. Conjunctivae and EOM are  normal.  Neck: Normal range of motion. Neck supple.  Cardiovascular: Normal rate, regular rhythm and intact distal pulses.  Respiratory: Effort normal. No respiratory distress.  GI: Soft. He exhibits no distension.  Genitourinary:    Genitourinary Comments: deferred   Musculoskeletal:     Right hip: He exhibits decreased range of motion, tenderness and crepitus.  Neurological: He is alert and oriented to person, place, and time. He has normal reflexes.  Skin: Skin is warm.  Psychiatric: He has a normal mood and affect. His behavior is normal. Judgment and thought content normal.    Vital signs in last 24 hours: @VSRANGES @  Labs:   Estimated body mass index is 39.16 kg/m as calculated from the following:   Height as of an earlier encounter on 06/30/18: 5\' 9"  (8.242 m).   Weight as of an earlier encounter on 06/30/18: 120.3 kg.   Imaging Review Plain radiographs demonstrate severe degenerative joint disease of the right hip(s). The bone quality appears to be adequate for age and reported activity level.      Assessment/Plan:  End stage arthritis, right hip(s)  The patient history, physical examination, clinical judgement of the provider and imaging studies are consistent with end stage degenerative joint disease of the right hip(s) and total hip arthroplasty is deemed medically necessary. The treatment options including medical management, injection therapy, arthroscopy and arthroplasty were discussed at length. The risks and benefits of total hip arthroplasty were presented and reviewed. The risks due to aseptic loosening, infection, stiffness, dislocation/subluxation,  thromboembolic complications and other imponderables were discussed.  The patient acknowledged the explanation, agreed to proceed with the plan and consent was signed. Patient is being admitted for inpatient treatment for surgery, pain control, PT, OT, prophylactic antibiotics, VTE prophylaxis, progressive ambulation  and ADL's and discharge planning.The patient is planning to be discharged home with HEP    Patient's anticipated LOS is less than 2 midnights, meeting these requirements: - Younger than 76 - Lives within 1 hour of care - Has a competent adult at home to recover with post-op recover - NO history of  - Chronic pain requiring opiods  - Diabetes  - Coronary Artery Disease  - Heart failure  - Heart attack  - Stroke  - DVT/VTE  - Cardiac arrhythmia  - Respiratory Failure/COPD  - Renal failure  - Anemia  - Advanced Liver disease

## 2018-07-07 ENCOUNTER — Other Ambulatory Visit (HOSPITAL_COMMUNITY)
Admission: RE | Admit: 2018-07-07 | Discharge: 2018-07-07 | Disposition: A | Discharge status: Home / Self Care | Payer: 59 | Source: Ambulatory Visit | Attending: Orthopedic Surgery | Admitting: Orthopedic Surgery

## 2018-07-07 DIAGNOSIS — Z1159 Encounter for screening for other viral diseases: Secondary | ICD-10-CM | POA: Diagnosis present

## 2018-07-07 LAB — SARS CORONAVIRUS 2 BY RT PCR (HOSPITAL ORDER, PERFORMED IN ~~LOC~~ HOSPITAL LAB): SARS Coronavirus 2: NEGATIVE

## 2018-07-08 MED ORDER — DEXTROSE 5 % IV SOLN
3.0000 g | INTRAVENOUS | Status: AC
  Administered 2018-07-09: 3 g via INTRAVENOUS
  Filled 2018-07-08: qty 3

## 2018-07-08 NOTE — Progress Notes (Signed)
SPOKE W/  _patient     SCREENING SYMPTOMS OF COVID 19:   COUGH--no  RUNNY NOSE--- no  SORE THROAT---no  NASAL CONGESTION----no  SNEEZING----no  SHORTNESS OF BREATH---no  DIFFICULTY BREATHING---no  TEMP >100.0 -----no  UNEXPLAINED BODY ACHES------no  CHILLS --------no   HEADACHES ---------no  LOSS OF SMELL/ TASTE --------no    HAVE YOU OR ANY FAMILY MEMBER TRAVELLED PAST 14 DAYS OUT OF THE   COUNTY---no STATE----no COUNTRY----no  HAVE YOU OR ANY FAMILY MEMBER BEEN EXPOSED TO ANYONE WITH COVID 19? no    

## 2018-07-08 NOTE — Anesthesia Preprocedure Evaluation (Addendum)
Anesthesia Evaluation  Patient identified by MRN, date of birth, ID band Patient awake    Reviewed: Allergy & Precautions, NPO status , Patient's Chart, lab work & pertinent test results  Airway Mallampati: III  TM Distance: >3 FB Neck ROM: Full    Dental no notable dental hx. (+) Teeth Intact, Dental Advisory Given   Pulmonary neg pulmonary ROS,    Pulmonary exam normal breath sounds clear to auscultation       Cardiovascular hypertension, Normal cardiovascular exam Rhythm:Regular Rate:Normal  Stress Test 2017 There was no ST segment deviation noted during stress. No T wave inversion was noted during stress.   Neuro/Psych negative neurological ROS  negative psych ROS   GI/Hepatic Neg liver ROS, GERD  Medicated and Controlled,  Endo/Other  diabetes, Type 2, Oral Hypoglycemic AgentsMorbid obesity  Renal/GU negative Renal ROS  negative genitourinary   Musculoskeletal  (+) Arthritis ,   Abdominal   Peds  Hematology negative hematology ROS (+)   Anesthesia Other Findings   Reproductive/Obstetrics                           Anesthesia Physical Anesthesia Plan  ASA: III  Anesthesia Plan: Spinal   Post-op Pain Management:    Induction:   PONV Risk Score and Plan: Treatment may vary due to age or medical condition  Airway Management Planned: Natural Airway  Additional Equipment:   Intra-op Plan:   Post-operative Plan:   Informed Consent: I have reviewed the patients History and Physical, chart, labs and discussed the procedure including the risks, benefits and alternatives for the proposed anesthesia with the patient or authorized representative who has indicated his/her understanding and acceptance.     Dental advisory given  Plan Discussed with: CRNA  Anesthesia Plan Comments:         Anesthesia Quick Evaluation

## 2018-07-09 ENCOUNTER — Inpatient Hospital Stay (HOSPITAL_COMMUNITY): Payer: 59

## 2018-07-09 ENCOUNTER — Encounter (HOSPITAL_COMMUNITY)
Admission: RE | Disposition: A | Discharge status: Home / Self Care | Payer: Self-pay | Source: Home / Self Care | Attending: Orthopedic Surgery

## 2018-07-09 ENCOUNTER — Inpatient Hospital Stay (HOSPITAL_COMMUNITY): Payer: 59 | Admitting: Physician Assistant

## 2018-07-09 ENCOUNTER — Inpatient Hospital Stay (HOSPITAL_COMMUNITY)
Admission: RE | Admit: 2018-07-09 | Discharge: 2018-07-11 | DRG: 470 | Disposition: A | Discharge status: Home / Self Care | Payer: 59 | Attending: Orthopedic Surgery | Admitting: Orthopedic Surgery

## 2018-07-09 ENCOUNTER — Other Ambulatory Visit: Payer: Self-pay

## 2018-07-09 ENCOUNTER — Inpatient Hospital Stay (HOSPITAL_COMMUNITY): Payer: 59 | Admitting: Certified Registered Nurse Anesthetist

## 2018-07-09 ENCOUNTER — Encounter (HOSPITAL_COMMUNITY): Payer: Self-pay | Admitting: *Deleted

## 2018-07-09 DIAGNOSIS — Z8701 Personal history of pneumonia (recurrent): Secondary | ICD-10-CM | POA: Diagnosis not present

## 2018-07-09 DIAGNOSIS — E785 Hyperlipidemia, unspecified: Secondary | ICD-10-CM | POA: Diagnosis present

## 2018-07-09 DIAGNOSIS — Z79899 Other long term (current) drug therapy: Secondary | ICD-10-CM

## 2018-07-09 DIAGNOSIS — Z85820 Personal history of malignant melanoma of skin: Secondary | ICD-10-CM | POA: Diagnosis not present

## 2018-07-09 DIAGNOSIS — K219 Gastro-esophageal reflux disease without esophagitis: Secondary | ICD-10-CM | POA: Diagnosis present

## 2018-07-09 DIAGNOSIS — Z419 Encounter for procedure for purposes other than remedying health state, unspecified: Secondary | ICD-10-CM

## 2018-07-09 DIAGNOSIS — M1611 Unilateral primary osteoarthritis, right hip: Principal | ICD-10-CM | POA: Diagnosis present

## 2018-07-09 DIAGNOSIS — Z7984 Long term (current) use of oral hypoglycemic drugs: Secondary | ICD-10-CM | POA: Diagnosis not present

## 2018-07-09 DIAGNOSIS — E119 Type 2 diabetes mellitus without complications: Secondary | ICD-10-CM | POA: Diagnosis present

## 2018-07-09 DIAGNOSIS — Z9181 History of falling: Secondary | ICD-10-CM

## 2018-07-09 DIAGNOSIS — I10 Essential (primary) hypertension: Secondary | ICD-10-CM | POA: Diagnosis present

## 2018-07-09 DIAGNOSIS — Z8249 Family history of ischemic heart disease and other diseases of the circulatory system: Secondary | ICD-10-CM

## 2018-07-09 DIAGNOSIS — Z7982 Long term (current) use of aspirin: Secondary | ICD-10-CM | POA: Diagnosis not present

## 2018-07-09 DIAGNOSIS — Z09 Encounter for follow-up examination after completed treatment for conditions other than malignant neoplasm: Secondary | ICD-10-CM

## 2018-07-09 HISTORY — PX: TOTAL HIP ARTHROPLASTY: SHX124

## 2018-07-09 LAB — GLUCOSE, CAPILLARY
Glucose-Capillary: 134 mg/dL — ABNORMAL HIGH (ref 70–99)
Glucose-Capillary: 89 mg/dL (ref 70–99)
Glucose-Capillary: 100 mg/dL — ABNORMAL HIGH (ref 70–99)
Glucose-Capillary: 156 mg/dL — ABNORMAL HIGH (ref 70–99)

## 2018-07-09 LAB — TYPE AND SCREEN
ABO/RH(D): O POS
Antibody Screen: NEGATIVE

## 2018-07-09 SURGERY — ARTHROPLASTY, HIP, TOTAL, ANTERIOR APPROACH
Anesthesia: Spinal | Site: Hip | Laterality: Right

## 2018-07-09 MED ORDER — PANTOPRAZOLE SODIUM 40 MG PO TBEC
40.0000 mg | DELAYED_RELEASE_TABLET | Freq: Every day | ORAL | Status: DC
  Administered 2018-07-10 – 2018-07-11 (×2): 40 mg via ORAL
  Filled 2018-07-09 (×2): qty 1

## 2018-07-09 MED ORDER — SODIUM CHLORIDE 0.9 % IV SOLN
INTRAVENOUS | Status: DC | PRN
  Administered 2018-07-09: 09:00:00 50 ug/min via INTRAVENOUS

## 2018-07-09 MED ORDER — PROPOFOL 500 MG/50ML IV EMUL
INTRAVENOUS | Status: DC | PRN
  Administered 2018-07-09: 75 ug/kg/min via INTRAVENOUS

## 2018-07-09 MED ORDER — ASPIRIN 81 MG PO CHEW
81.0000 mg | CHEWABLE_TABLET | Freq: Two times a day (BID) | ORAL | Status: DC
  Administered 2018-07-09 – 2018-07-11 (×4): 81 mg via ORAL
  Filled 2018-07-09 (×4): qty 1

## 2018-07-09 MED ORDER — TRANEXAMIC ACID-NACL 1000-0.7 MG/100ML-% IV SOLN
1000.0000 mg | INTRAVENOUS | Status: AC
  Administered 2018-07-09: 08:00:00 1000 mg via INTRAVENOUS
  Filled 2018-07-09: qty 100

## 2018-07-09 MED ORDER — CHLORHEXIDINE GLUCONATE 4 % EX LIQD: 60.0000 mL | Freq: Once | CUTANEOUS | Status: DC

## 2018-07-09 MED ORDER — METHOCARBAMOL 500 MG PO TABS
500.0000 mg | ORAL_TABLET | Freq: Four times a day (QID) | ORAL | Status: DC | PRN
  Administered 2018-07-09 – 2018-07-11 (×6): 500 mg via ORAL
  Filled 2018-07-09 (×7): qty 1

## 2018-07-09 MED ORDER — ALUM & MAG HYDROXIDE-SIMETH 200-200-20 MG/5ML PO SUSP: 30.0000 mL | ORAL | Status: DC | PRN

## 2018-07-09 MED ORDER — ONDANSETRON HCL 4 MG PO TABS: 4.0000 mg | ORAL_TABLET | Freq: Four times a day (QID) | ORAL | Status: DC | PRN

## 2018-07-09 MED ORDER — ACETAMINOPHEN 10 MG/ML IV SOLN
1000.0000 mg | INTRAVENOUS | Status: AC
  Administered 2018-07-09: 1000 mg via INTRAVENOUS
  Filled 2018-07-09: qty 100

## 2018-07-09 MED ORDER — FENTANYL CITRATE (PF) 250 MCG/5ML IJ SOLN
INTRAMUSCULAR | Status: AC
  Filled 2018-07-09: qty 5

## 2018-07-09 MED ORDER — INSULIN ASPART 100 UNIT/ML ~~LOC~~ SOLN
0.0000 [IU] | Freq: Three times a day (TID) | SUBCUTANEOUS | Status: DC
  Administered 2018-07-10: 5 [IU] via SUBCUTANEOUS
  Administered 2018-07-11: 2 [IU] via SUBCUTANEOUS

## 2018-07-09 MED ORDER — PHENYLEPHRINE 40 MCG/ML (10ML) SYRINGE FOR IV PUSH (FOR BLOOD PRESSURE SUPPORT)
PREFILLED_SYRINGE | INTRAVENOUS | Status: AC
  Filled 2018-07-09: qty 10

## 2018-07-09 MED ORDER — METHOCARBAMOL 500 MG IVPB - SIMPLE MED
INTRAVENOUS | Status: AC
  Filled 2018-07-09: qty 50

## 2018-07-09 MED ORDER — DEXAMETHASONE SODIUM PHOSPHATE 10 MG/ML IJ SOLN
10.0000 mg | Freq: Once | INTRAMUSCULAR | Status: AC
  Administered 2018-07-10: 10 mg via INTRAVENOUS
  Filled 2018-07-09: qty 1

## 2018-07-09 MED ORDER — PHENYLEPHRINE HCL (PRESSORS) 10 MG/ML IV SOLN
INTRAVENOUS | Status: AC
  Filled 2018-07-09: qty 1

## 2018-07-09 MED ORDER — CANAGLIFLOZIN 100 MG PO TABS
100.0000 mg | ORAL_TABLET | Freq: Every day | ORAL | Status: DC
  Filled 2018-07-09 (×2): qty 1

## 2018-07-09 MED ORDER — KETOROLAC TROMETHAMINE 15 MG/ML IJ SOLN
15.0000 mg | Freq: Four times a day (QID) | INTRAMUSCULAR | Status: AC
  Administered 2018-07-09 – 2018-07-10 (×4): 15 mg via INTRAVENOUS
  Filled 2018-07-09 (×4): qty 1

## 2018-07-09 MED ORDER — ONDANSETRON HCL 4 MG/2ML IJ SOLN
INTRAMUSCULAR | Status: AC
  Filled 2018-07-09: qty 2

## 2018-07-09 MED ORDER — SENNA 8.6 MG PO TABS
1.0000 | ORAL_TABLET | Freq: Two times a day (BID) | ORAL | Status: DC
  Administered 2018-07-09 – 2018-07-11 (×4): 8.6 mg via ORAL
  Filled 2018-07-09 (×4): qty 1

## 2018-07-09 MED ORDER — LACTATED RINGERS IV SOLN
INTRAVENOUS | Status: DC
  Administered 2018-07-09 (×2): via INTRAVENOUS
  Administered 2018-07-09: 1000 mL via INTRAVENOUS

## 2018-07-09 MED ORDER — SODIUM CHLORIDE 0.9 % IV SOLN
INTRAVENOUS | Status: DC
  Administered 2018-07-10 (×3): via INTRAVENOUS

## 2018-07-09 MED ORDER — INSULIN ASPART 100 UNIT/ML ~~LOC~~ SOLN: 0.0000 [IU] | Freq: Every day | SUBCUTANEOUS | Status: DC

## 2018-07-09 MED ORDER — PROPOFOL 10 MG/ML IV BOLUS
INTRAVENOUS | Status: AC
  Filled 2018-07-09: qty 20

## 2018-07-09 MED ORDER — BUPIVACAINE-EPINEPHRINE (PF) 0.25% -1:200000 IJ SOLN
INTRAMUSCULAR | Status: AC
  Filled 2018-07-09: qty 30

## 2018-07-09 MED ORDER — METOCLOPRAMIDE HCL 5 MG PO TABS: 5.0000 mg | ORAL_TABLET | Freq: Three times a day (TID) | ORAL | Status: DC | PRN

## 2018-07-09 MED ORDER — MIDAZOLAM HCL 5 MG/5ML IJ SOLN
INTRAMUSCULAR | Status: DC | PRN
  Administered 2018-07-09: 2 mg via INTRAVENOUS

## 2018-07-09 MED ORDER — ONDANSETRON HCL 4 MG/2ML IJ SOLN
4.0000 mg | Freq: Four times a day (QID) | INTRAMUSCULAR | Status: DC | PRN
  Administered 2018-07-10: 4 mg via INTRAVENOUS
  Filled 2018-07-09: qty 2

## 2018-07-09 MED ORDER — DIPHENHYDRAMINE HCL 12.5 MG/5ML PO ELIX
12.5000 mg | ORAL_SOLUTION | ORAL | Status: DC | PRN
  Administered 2018-07-09: 25 mg via ORAL
  Filled 2018-07-09: qty 10

## 2018-07-09 MED ORDER — MIDAZOLAM HCL 2 MG/2ML IJ SOLN
INTRAMUSCULAR | Status: AC
  Filled 2018-07-09: qty 2

## 2018-07-09 MED ORDER — LORATADINE 10 MG PO TABS
10.0000 mg | ORAL_TABLET | Freq: Every day | ORAL | Status: DC
  Administered 2018-07-10 – 2018-07-11 (×2): 10 mg via ORAL
  Filled 2018-07-09 (×2): qty 1

## 2018-07-09 MED ORDER — METHOCARBAMOL 500 MG IVPB - SIMPLE MED
500.0000 mg | Freq: Four times a day (QID) | INTRAVENOUS | Status: DC | PRN
  Administered 2018-07-09: 500 mg via INTRAVENOUS
  Filled 2018-07-09: qty 50

## 2018-07-09 MED ORDER — DOCUSATE SODIUM 100 MG PO CAPS
100.0000 mg | ORAL_CAPSULE | Freq: Two times a day (BID) | ORAL | Status: DC
  Administered 2018-07-09 – 2018-07-11 (×4): 100 mg via ORAL
  Filled 2018-07-09 (×4): qty 1

## 2018-07-09 MED ORDER — LISINOPRIL 10 MG PO TABS
10.0000 mg | ORAL_TABLET | Freq: Every day | ORAL | Status: DC
  Administered 2018-07-09 – 2018-07-11 (×3): 10 mg via ORAL
  Filled 2018-07-09 (×3): qty 1

## 2018-07-09 MED ORDER — FENTANYL CITRATE (PF) 100 MCG/2ML IJ SOLN
25.0000 ug | INTRAMUSCULAR | Status: DC | PRN
  Administered 2018-07-09: 50 ug via INTRAVENOUS

## 2018-07-09 MED ORDER — FLUTICASONE PROPIONATE 50 MCG/ACT NA SUSP
1.0000 | Freq: Every day | NASAL | Status: DC
  Administered 2018-07-10: 11:00:00 1 via NASAL
  Filled 2018-07-09: qty 16

## 2018-07-09 MED ORDER — SODIUM CHLORIDE (PF) 0.9 % IJ SOLN
INTRAMUSCULAR | Status: AC
  Filled 2018-07-09: qty 50

## 2018-07-09 MED ORDER — BUPIVACAINE IN DEXTROSE 0.75-8.25 % IT SOLN
INTRATHECAL | Status: DC | PRN
  Administered 2018-07-09: 1.8 mL via INTRATHECAL

## 2018-07-09 MED ORDER — PROPOFOL 10 MG/ML IV BOLUS
INTRAVENOUS | Status: DC | PRN
  Administered 2018-07-09: 50 mg via INTRAVENOUS
  Administered 2018-07-09: 20 mg via INTRAVENOUS

## 2018-07-09 MED ORDER — POVIDONE-IODINE 10 % EX SWAB: 2.0000 "application " | Freq: Once | CUTANEOUS | Status: DC

## 2018-07-09 MED ORDER — GLIPIZIDE ER 5 MG PO TB24
10.0000 mg | ORAL_TABLET | Freq: Every day | ORAL | Status: DC
  Administered 2018-07-09 – 2018-07-10 (×2): 10 mg via ORAL
  Filled 2018-07-09 (×2): qty 2

## 2018-07-09 MED ORDER — KETOROLAC TROMETHAMINE 30 MG/ML IJ SOLN
INTRAMUSCULAR | Status: DC | PRN
  Administered 2018-07-09: 30 mg via INTRA_ARTICULAR

## 2018-07-09 MED ORDER — ACETAMINOPHEN 325 MG PO TABS
325.0000 mg | ORAL_TABLET | Freq: Four times a day (QID) | ORAL | Status: DC | PRN
  Filled 2018-07-09: qty 2

## 2018-07-09 MED ORDER — ONDANSETRON HCL 4 MG/2ML IJ SOLN
INTRAMUSCULAR | Status: DC | PRN
  Administered 2018-07-09: 4 mg via INTRAVENOUS

## 2018-07-09 MED ORDER — CEFAZOLIN SODIUM-DEXTROSE 2-4 GM/100ML-% IV SOLN
2.0000 g | Freq: Four times a day (QID) | INTRAVENOUS | Status: AC
  Administered 2018-07-09 (×2): 2 g via INTRAVENOUS
  Filled 2018-07-09 (×2): qty 100

## 2018-07-09 MED ORDER — FENTANYL CITRATE (PF) 100 MCG/2ML IJ SOLN
INTRAMUSCULAR | Status: AC
  Filled 2018-07-09: qty 2

## 2018-07-09 MED ORDER — WATER FOR IRRIGATION, STERILE IR SOLN
Status: DC | PRN
  Administered 2018-07-09: 2000 mL

## 2018-07-09 MED ORDER — HYDROCODONE-ACETAMINOPHEN 5-325 MG PO TABS: 1.0000 | ORAL_TABLET | ORAL | Status: DC | PRN

## 2018-07-09 MED ORDER — KETOROLAC TROMETHAMINE 30 MG/ML IJ SOLN
INTRAMUSCULAR | Status: AC
  Filled 2018-07-09: qty 1

## 2018-07-09 MED ORDER — METOCLOPRAMIDE HCL 5 MG/ML IJ SOLN: 5.0000 mg | Freq: Three times a day (TID) | INTRAMUSCULAR | Status: DC | PRN

## 2018-07-09 MED ORDER — PHENYLEPHRINE 40 MCG/ML (10ML) SYRINGE FOR IV PUSH (FOR BLOOD PRESSURE SUPPORT)
PREFILLED_SYRINGE | INTRAVENOUS | Status: DC | PRN
  Administered 2018-07-09 (×5): 80 ug via INTRAVENOUS

## 2018-07-09 MED ORDER — MORPHINE SULFATE (PF) 2 MG/ML IV SOLN
0.5000 mg | INTRAVENOUS | Status: DC | PRN
  Administered 2018-07-09: 0.5 mg via INTRAVENOUS
  Administered 2018-07-09 – 2018-07-10 (×2): 1 mg via INTRAVENOUS
  Filled 2018-07-09 (×3): qty 1

## 2018-07-09 MED ORDER — PHENOL 1.4 % MT LIQD
1.0000 | OROMUCOSAL | Status: DC | PRN
  Filled 2018-07-09: qty 177

## 2018-07-09 MED ORDER — METFORMIN HCL ER 500 MG PO TB24
500.0000 mg | ORAL_TABLET | Freq: Two times a day (BID) | ORAL | Status: DC
  Administered 2018-07-10 – 2018-07-11 (×2): 500 mg via ORAL
  Filled 2018-07-09 (×4): qty 1

## 2018-07-09 MED ORDER — DAPAGLIFLOZIN PRO-METFORMIN ER 5-500 MG PO TB24: 1.0000 | ORAL_TABLET | Freq: Two times a day (BID) | ORAL | Status: DC

## 2018-07-09 MED ORDER — MENTHOL 3 MG MT LOZG: 1.0000 | LOZENGE | OROMUCOSAL | Status: DC | PRN

## 2018-07-09 MED ORDER — SODIUM CHLORIDE 0.9 % IR SOLN
Status: DC | PRN
  Administered 2018-07-09: 1000 mL

## 2018-07-09 MED ORDER — POLYETHYLENE GLYCOL 3350 17 G PO PACK: 17.0000 g | PACK | Freq: Every day | ORAL | Status: DC | PRN

## 2018-07-09 MED ORDER — ISOPROPYL ALCOHOL 70 % SOLN
Status: AC
  Filled 2018-07-09: qty 480

## 2018-07-09 MED ORDER — HYDROCODONE-ACETAMINOPHEN 7.5-325 MG PO TABS
1.0000 | ORAL_TABLET | ORAL | Status: DC | PRN
  Administered 2018-07-09: 2 via ORAL
  Administered 2018-07-09 (×2): 1 via ORAL
  Administered 2018-07-10: 2 via ORAL
  Administered 2018-07-10: 1 via ORAL
  Administered 2018-07-10: 2 via ORAL
  Administered 2018-07-11: 1 via ORAL
  Administered 2018-07-11: 2 via ORAL
  Filled 2018-07-09 (×2): qty 2
  Filled 2018-07-09: qty 1
  Filled 2018-07-09: qty 2
  Filled 2018-07-09 (×3): qty 1
  Filled 2018-07-09: qty 2

## 2018-07-09 MED ORDER — PROPOFOL 10 MG/ML IV BOLUS
INTRAVENOUS | Status: AC
  Filled 2018-07-09: qty 40

## 2018-07-09 SURGICAL SUPPLY — 57 items
ACETAB CUP W/GRIPTION 54 (Plate) ×2 IMPLANT
BAG DECANTER FOR FLEXI CONT (MISCELLANEOUS) IMPLANT
BAG ZIPLOCK 12X15 (MISCELLANEOUS) IMPLANT
BLADE SURG SZ10 CARB STEEL (BLADE) ×4 IMPLANT
CHLORAPREP W/TINT 26 (MISCELLANEOUS) ×2 IMPLANT
CLOTH BEACON ORANGE TIMEOUT ST (SAFETY) ×2 IMPLANT
COVER PERINEAL POST (MISCELLANEOUS) ×2 IMPLANT
COVER SURGICAL LIGHT HANDLE (MISCELLANEOUS) ×2 IMPLANT
COVER WAND RF STERILE (DRAPES) IMPLANT
CUP ACETAB W/GRIPTION 54 (Plate) ×1 IMPLANT
DECANTER SPIKE VIAL GLASS SM (MISCELLANEOUS) ×2 IMPLANT
DERMABOND ADVANCED (GAUZE/BANDAGES/DRESSINGS) ×1
DERMABOND ADVANCED .7 DNX12 (GAUZE/BANDAGES/DRESSINGS) ×1 IMPLANT
DRAPE IMP U-DRAPE 54X76 (DRAPES) ×2 IMPLANT
DRAPE SHEET LG 3/4 BI-LAMINATE (DRAPES) ×6 IMPLANT
DRAPE STERI IOBAN 125X83 (DRAPES) ×2 IMPLANT
DRAPE U-SHAPE 47X51 STRL (DRAPES) ×4 IMPLANT
DRSG AQUACEL AG ADV 3.5X10 (GAUZE/BANDAGES/DRESSINGS) ×2 IMPLANT
ELECT BLADE TIP CTD 4 INCH (ELECTRODE) ×2 IMPLANT
ELECT PENCIL ROCKER SW 15FT (MISCELLANEOUS) ×2 IMPLANT
ELECT REM PT RETURN 15FT ADLT (MISCELLANEOUS) ×2 IMPLANT
GAUZE SPONGE 4X4 12PLY STRL (GAUZE/BANDAGES/DRESSINGS) ×2 IMPLANT
GLOVE BIO SURGEON STRL SZ8.5 (GLOVE) ×4 IMPLANT
GLOVE BIOGEL PI IND STRL 8.5 (GLOVE) ×1 IMPLANT
GLOVE BIOGEL PI INDICATOR 8.5 (GLOVE) ×1
GOWN SPEC L3 XXLG W/TWL (GOWN DISPOSABLE) ×2 IMPLANT
HANDPIECE INTERPULSE COAX TIP (DISPOSABLE) ×1
HEAD FEMORAL 32 CERAMIC (Hips) ×2 IMPLANT
HOLDER FOLEY CATH W/STRAP (MISCELLANEOUS) ×2 IMPLANT
HOOD PEEL AWAY FLYTE STAYCOOL (MISCELLANEOUS) ×8 IMPLANT
KIT TURNOVER KIT A (KITS) IMPLANT
LINER PINN ACET NEUT 32X54 ×2 IMPLANT
MANIFOLD NEPTUNE II (INSTRUMENTS) ×2 IMPLANT
MARKER SKIN DUAL TIP RULER LAB (MISCELLANEOUS) ×2 IMPLANT
NDL SAFETY ECLIPSE 18X1.5 (NEEDLE) ×1 IMPLANT
NEEDLE HYPO 18GX1.5 SHARP (NEEDLE) ×1
NEEDLE SPNL 18GX3.5 QUINCKE PK (NEEDLE) ×2 IMPLANT
PACK ANTERIOR HIP CUSTOM (KITS) ×2 IMPLANT
SAW OSC TIP CART 19.5X105X1.3 (SAW) ×2 IMPLANT
SEALER BIPOLAR AQUA 6.0 (INSTRUMENTS) ×2 IMPLANT
SET HNDPC FAN SPRY TIP SCT (DISPOSABLE) ×1 IMPLANT
STEM TRI LOC BPS SZ4 W GRIPTON (Hips) ×1 IMPLANT
SUT ETHIBOND NAB CT1 #1 30IN (SUTURE) ×4 IMPLANT
SUT MNCRL AB 3-0 PS2 18 (SUTURE) ×2 IMPLANT
SUT MNCRL AB 4-0 PS2 18 (SUTURE) ×2 IMPLANT
SUT MON AB 2-0 CT1 36 (SUTURE) ×4 IMPLANT
SUT STRATAFIX PDO 1 14 VIOLET (SUTURE) ×1
SUT STRATFX PDO 1 14 VIOLET (SUTURE) ×1
SUT VIC AB 2-0 CT1 27 (SUTURE) ×1
SUT VIC AB 2-0 CT1 TAPERPNT 27 (SUTURE) ×1 IMPLANT
SUTURE STRATFX PDO 1 14 VIOLET (SUTURE) ×1 IMPLANT
SYR 3ML LL SCALE MARK (SYRINGE) ×4 IMPLANT
SYR 50ML LL SCALE MARK (SYRINGE) ×2 IMPLANT
TRAY FOLEY MTR SLVR 16FR STAT (SET/KITS/TRAYS/PACK) IMPLANT
TRI LOC BPS SZ 4 W GRIPTON (Hips) ×2 IMPLANT
WATER STERILE IRR 1000ML POUR (IV SOLUTION) ×2 IMPLANT
YANKAUER SUCT BULB TIP 10FT TU (MISCELLANEOUS) ×2 IMPLANT

## 2018-07-09 NOTE — Plan of Care (Signed)
  Problem: Education: Goal: Knowledge of the prescribed therapeutic regimen will improve Outcome: Progressing Goal: Understanding of discharge needs will improve Outcome: Progressing   Problem: Activity: Goal: Ability to avoid complications of mobility impairment will improve Outcome: Progressing Goal: Ability to tolerate increased activity will improve Outcome: Progressing   Problem: Clinical Measurements: Goal: Postoperative complications will be avoided or minimized Outcome: Progressing   Problem: Pain Management: Goal: Pain level will decrease with appropriate interventions Outcome: Progressing   

## 2018-07-09 NOTE — Interval H&P Note (Signed)
History and Physical Interval Note:  07/09/2018 7:43 AM  Michael Sandoval  has presented today for surgery, with the diagnosis of Degenerative Joint Disease right hip.  The various methods of treatment have been discussed with the patient and family. After consideration of risks, benefits and other options for treatment, the patient has consented to  Procedure(s): TOTAL HIP ARTHROPLASTY ANTERIOR APPROACH (Right) as a surgical intervention.  The patient's history has been reviewed, patient examined, no change in status, stable for surgery.  I have reviewed the patient's chart and labs.  Questions were answered to the patient's satisfaction.     Hilton Cork Marwan Lipe

## 2018-07-09 NOTE — Evaluation (Signed)
Physical Therapy Evaluation Patient Details Name: Michael Sandoval MRN: [id number] DOB: 03-10-1965 Today's Date: 07/09/2018   History of Present Illness  Pt s/p R THR and with hx of DM  Clinical Impression  Pt s/p R THR and presents with decreased R LE strength/ROM, obesity and post op pain limiting functional mobility.  Pt should progress to dc home with family assist.    Follow Up Recommendations Follow surgeon's recommendation for DC plan and follow-up therapies    Equipment Recommendations       Recommendations for Other Services OT consult     Precautions / Restrictions Precautions Precautions: Fall Restrictions Weight Bearing Restrictions: No Other Position/Activity Restrictions: WBAT      Mobility  Bed Mobility Overal bed mobility: Needs Assistance Bed Mobility: Supine to Sit;Sit to Supine     Supine to sit: Mod assist;+2 for physical assistance;+2 for safety/equipment Sit to supine: Mod assist;+2 for physical assistance;+2 for safety/equipment   General bed mobility comments: Increased time with cues for sequence and use of L LE to self assist.  Transfers                 General transfer comment: NT 2* pt c/o dizziness in sitting  Ambulation/Gait                Stairs            Wheelchair Mobility    Modified Rankin (Stroke Patients Only)       Balance                                             Pertinent Vitals/Pain Pain Assessment: 0-10 Pain Score: 9  Pain Location: R hip and thigh Pain Descriptors / Indicators: Aching;Grimacing;Guarding;Spasm Pain Intervention(s): Limited activity within patient's tolerance;Monitored during session;Premedicated before session;Ice applied    Home Living Family/patient expects to be discharged to:: Private residence Living Arrangements: Alone Available Help at Discharge: Family Type of Home: House Home Access: Stairs to enter Entrance Stairs-Rails: Right;Left;Can reach  both Technical brewer of Steps: 4 Home Layout: One level Home Equipment: Environmental consultant - 2 wheels Additional Comments: Brother will stay for one week    Prior Function Level of Independence: Independent               Hand Dominance        Extremity/Trunk Assessment   Upper Extremity Assessment Upper Extremity Assessment: Overall WFL for tasks assessed    Lower Extremity Assessment Lower Extremity Assessment: RLE deficits/detail RLE: Unable to fully assess due to pain       Communication   Communication: No difficulties  Cognition Arousal/Alertness: Awake/alert Behavior During Therapy: WFL for tasks assessed/performed Overall Cognitive Status: Within Functional Limits for tasks assessed                                        General Comments      Exercises Total Joint Exercises Ankle Circles/Pumps: AROM;Both;15 reps;Supine   Assessment/Plan    PT Assessment Patient needs continued PT services  PT Problem List Decreased strength;Decreased range of motion;Decreased activity tolerance;Decreased mobility;Decreased knowledge of use of DME;Obesity;Pain       PT Treatment Interventions DME instruction;Gait training;Stair training;Functional mobility training;Therapeutic activities;Therapeutic exercise;Patient/family education    PT Goals (Current goals can be found  in the Care Plan section)  Acute Rehab PT Goals Patient Stated Goal: Less pain PT Goal Formulation: With patient Time For Goal Achievement: 07/16/18 Potential to Achieve Goals: Good    Frequency 7X/week   Barriers to discharge        Co-evaluation               AM-PAC PT "6 Clicks" Mobility  Outcome Measure Help needed turning from your back to your side while in a flat bed without using bedrails?: A Lot Help needed moving from lying on your back to sitting on the side of a flat bed without using bedrails?: A Lot Help needed moving to and from a bed to a chair  (including a wheelchair)?: A Lot Help needed standing up from a chair using your arms (e.g., wheelchair or bedside chair)?: A Lot Help needed to walk in hospital room?: Total Help needed climbing 3-5 steps with a railing? : Total 6 Click Score: 10    End of Session   Activity Tolerance: Patient limited by pain Patient left: in bed;with call bell/phone within reach Nurse Communication: Mobility status PT Visit Diagnosis: Difficulty in walking, not elsewhere classified (R26.2);Pain Pain - Right/Left: Right Pain - part of body: Hip    Time: 7741-2878 PT Time Calculation (min) (ACUTE ONLY): 29 min   Charges:   PT Evaluation $PT Eval Low Complexity: 1 Low PT Treatments $Therapeutic Activity: 8-22 mins        Debe Coder PT Acute Rehabilitation Services Pager [phone] Office [phone]   Rumi Kolodziej 07/09/2018, 5:15 PM

## 2018-07-09 NOTE — Anesthesia Procedure Notes (Addendum)
Spinal  Patient location during procedure: OR Start time: 07/09/2018 7:53 AM End time: 07/09/2018 7:57 AM Staffing Anesthesiologist: Freddrick March, MD Resident/CRNA: Montel Clock, CRNA Performed: resident/CRNA  Preanesthetic Checklist Completed: patient identified, surgical consent, pre-op evaluation, timeout performed, IV checked, risks and benefits discussed and monitors and equipment checked Spinal Block Patient position: sitting Prep: DuraPrep Patient monitoring: heart rate, cardiac monitor, continuous pulse ox and blood pressure Approach: midline Location: L3-4 Injection technique: single-shot Needle Needle type: Quincke  Needle gauge: 22 G Needle length: 9 cm Needle insertion depth: 8 cm Assessment Sensory level: T6 Additional Notes First attempt 24g Pencan negative CSF. Second attempt 22g Quinke, negative heme/parasthesia, positive CSF flow pre and post injection.

## 2018-07-09 NOTE — Discharge Instructions (Signed)
°Dr. Kylyn Sookram °Joint Replacement Specialist °[facility] °[address] °[place], [place] [address] °[phone] ° ° °TOTAL HIP REPLACEMENT POSTOPERATIVE DIRECTIONS ° ° ° °Hip Rehabilitation, Guidelines Following Surgery  ° °WEIGHT BEARING °Weight bearing as tolerated with assist device (walker, cane, etc) as directed, use it as long as suggested by your surgeon or therapist, typically at least 4-6 weeks. ° °The results of a hip operation are greatly improved after range of motion and muscle strengthening exercises. Follow all safety measures which are given to protect your hip. If any of these exercises cause increased pain or swelling in your joint, decrease the amount until you are comfortable again. Then slowly increase the exercises. Call your caregiver if you have problems or questions.  ° °HOME CARE INSTRUCTIONS  °Most of the following instructions are designed to prevent the dislocation of your new hip.  °Remove items at home which could result in a fall. This includes throw rugs or furniture in walking pathways.  °Continue medications as instructed at time of discharge. °· You may have some home medications which will be placed on hold until you complete the course of blood thinner medication. °· You may start showering once you are discharged home. Do not remove your dressing. °Do not put on socks or shoes without following the instructions of your caregivers.   °Sit on chairs with arms. Use the chair arms to help push yourself up when arising.  °Arrange for the use of a toilet seat elevator so you are not sitting low.  °· Walk with walker as instructed.  °You may resume a sexual relationship in one month or when given the OK by your caregiver.  °Use walker as long as suggested by your caregivers.  °You may put full weight on your legs and walk as much as is comfortable. °Avoid periods of inactivity such as sitting longer than an hour when not asleep. This helps prevent  blood clots.  °You may return to work once you are cleared by your surgeon.  °Do not drive a car for 6 weeks or until released by your surgeon.  °Do not drive while taking narcotics.  °Wear elastic stockings for two weeks following surgery during the day but you may remove then at night.  °Make sure you keep all of your appointments after your operation with all of your doctors and caregivers. You should call the office at the above phone number and make an appointment for approximately two weeks after the date of your surgery. °Please pick up a stool softener and laxative for home use as long as you are requiring pain medications. °· ICE to the affected hip every three hours for 30 minutes at a time and then as needed for pain and swelling. Continue to use ice on the hip for pain and swelling from surgery. You may notice swelling that will progress down to the foot and ankle.  This is normal after surgery.  Elevate the leg when you are not up walking on it.   °It is important for you to complete the blood thinner medication as prescribed by your doctor. °· Continue to use the breathing machine which will help keep your temperature down.  It is common for your temperature to cycle up and down following surgery, especially at night when you are not up moving around and exerting yourself.  The breathing machine keeps your lungs expanded and your temperature down. ° °RANGE OF MOTION AND STRENGTHENING EXERCISES  °These exercises are   designed to help you keep full movement of your hip joint. Follow your caregiver's or physical therapist's instructions. Perform all exercises about fifteen times, three times per day or as directed. Exercise both hips, even if you have had only one joint replacement. These exercises can be done on a training (exercise) mat, on the floor, on a table or on a bed. Use whatever works the best and is most comfortable for you. Use music or television while you are exercising so that the exercises  are a pleasant break in your day. This will make your life better with the exercises acting as a break in routine you can look forward to.  °Lying on your back, slowly slide your foot toward your buttocks, raising your knee up off the floor. Then slowly slide your foot back down until your leg is straight again.  °Lying on your back spread your legs as far apart as you can without causing discomfort.  °Lying on your side, raise your upper leg and foot straight up from the floor as far as is comfortable. Slowly lower the leg and repeat.  °Lying on your back, tighten up the muscle in the front of your thigh (quadriceps muscles). You can do this by keeping your leg straight and trying to raise your heel off the floor. This helps strengthen the largest muscle supporting your knee.  °Lying on your back, tighten up the muscles of your buttocks both with the legs straight and with the knee bent at a comfortable angle while keeping your heel on the floor.  ° °SKILLED REHAB INSTRUCTIONS: °If the patient is transferred to a skilled rehab facility following release from the hospital, a list of the current medications will be sent to the facility for the patient to continue.  When discharged from the skilled rehab facility, please have the facility set up the patient's Home Health Physical Therapy prior to being released. Also, the skilled facility will be responsible for providing the patient with their medications at time of release from the facility to include their pain medication and their blood thinner medication. If the patient is still at the rehab facility at time of the two week follow up appointment, the skilled rehab facility will also need to assist the patient in arranging follow up appointment in our office and any transportation needs. ° °MAKE SURE YOU:  °Understand these instructions.  °Will watch your condition.  °Will get help right away if you are not doing well or get worse. ° °Pick up stool softner and  laxative for home use following surgery while on pain medications. °Do not remove your dressing. °The dressing is waterproof--it is OK to take showers. °Continue to use ice for pain and swelling after surgery. °Do not use any lotions or creams on the incision until instructed by your surgeon. °Total Hip Protocol. ° ° °

## 2018-07-09 NOTE — Transfer of Care (Signed)
Immediate Anesthesia Transfer of Care Note  Patient: Michael Sandoval  Procedure(s) Performed: TOTAL HIP ARTHROPLASTY ANTERIOR APPROACH (Right Hip)  Patient Location: PACU  Anesthesia Type:MAC and Spinal  Level of Consciousness: awake, alert , oriented and patient cooperative  Airway & Oxygen Therapy: Patient Spontanous Breathing and Patient connected to face mask oxygen  Post-op Assessment: Report given to RN, Post -op Vital signs reviewed and stable and Patient moving all extremities  Post vital signs: Reviewed and stable  Last Vitals:  Vitals Value Taken Time  BP 100/71 07/09/2018 10:45 AM  Temp    Pulse 74 07/09/2018 10:46 AM  Resp 12 07/09/2018 10:46 AM  SpO2 97 % 07/09/2018 10:46 AM  Vitals shown include unvalidated device data.  Last Pain:  Vitals:   07/09/18 0547  TempSrc: Oral  PainSc: 2       Patients Stated Pain Goal: 4 (96/22/29 7989)  Complications: No apparent anesthesia complications

## 2018-07-09 NOTE — Op Note (Signed)
OPERATIVE REPORT  SURGEON: Rod Can, MD   ASSISTANT: Staff.  PREOPERATIVE DIAGNOSIS: Right hip arthritis.   POSTOPERATIVE DIAGNOSIS: Right hip arthritis.   PROCEDURE: Right total hip arthroplasty, anterior approach.   IMPLANTS: DePuy Tri Lock stem, size 4, hi offset. DePuy Pinnacle Cup, size 54 mm. DePuy Altrx liner, size 32 by 54 mm, neutral. DePuy Biolox ceramic head ball, size 32 + 1 mm.  ANESTHESIA:  Spinal  ESTIMATED BLOOD LOSS:-350 mL    ANTIBIOTICS: 2 g Ancef.  DRAINS: None.  COMPLICATIONS: None.   CONDITION: PACU - hemodynamically stable.   BRIEF CLINICAL NOTE: Michael Sandoval is a 53 y.o. male with a long-standing history of Right hip arthritis. After failing conservative management, the patient was indicated for total hip arthroplasty. The risks, benefits, and alternatives to the procedure were explained, and the patient elected to proceed.  PROCEDURE IN DETAIL: Surgical site was marked by myself in the pre-op holding area. Once inside the operating room, spinal anesthesia was obtained, and a foley catheter was inserted. The patient was then positioned on the Hana table. All bony prominences were well padded. The hip was prepped and draped in the normal sterile surgical fashion. A time-out was called verifying side and site of surgery. The patient received IV antibiotics within 60 minutes of beginning the procedure.  The direct anterior approach to the hip was performed through the Hueter interval. Lateral femoral circumflex vessels were treated with the Auqumantys. The anterior capsule was exposed and an inverted T capsulotomy was made.The femoral neck cut was made to the level of the templated cut. A corkscrew was placed into the head and the head was removed. The femoral head was found to have eburnated bone. The head was passed to the back table and was measured.  Acetabular exposure was achieved, and the pulvinar and labrum were excised.  Sequential reaming of the acetabulum was then performed up to a size 51 mm reamer. A 52 mm cup was then opened and impacted into place at approximately 40 degrees of abduction and 20 degrees of anteversion. The final polyethylene liner was impacted into place and acetabular osteophytes were removed.   I then gained femoral exposure taking care to protect the abductors and greater trochanter. This was performed using standard external rotation, extension, and adduction. The capsule was peeled off the inner aspect of the greater trochanter, taking care to preserve the short external rotators. A cookie cutter was used to enter the femoral canal, and then the femoral canal finder was placed. Sequential broaching was performed up to a size 4. Calcar planer was used on the femoral neck remnant. I placed a hi offset neck and a trial head ball. The hip was reduced. Leg lengths and offset were checked fluoroscopically. The hip was dislocated and trial components were removed. The final implants were placed, and the hip was reduced.  Fluoroscopy was used to confirm component position and leg lengths. At 90 degrees of external rotation and full extension, the hip was stable to an anterior directed force.  The wound was copiously irrigated with normal saline using pulse lavage. Marcaine solution was injected into the periarticular soft tissue. The wound was closed in layers using #1 Vicryl and V-Loc for the fascia, 2-0 Vicryl for the subcutaneous fat, 2-0 Monocryl for the deep dermal layer, 3-0 running Monocryl subcuticular stitch, and Dermabond for the skin. Once the glue was fully dried, an Aquacell Ag dressing was applied. The patient was transported to the recovery room in stable  condition. Sponge, needle, and instrument counts were correct at the end of the case x2. The patient tolerated the procedure well and there were no known complications.

## 2018-07-09 NOTE — Anesthesia Postprocedure Evaluation (Signed)
Anesthesia Post Note  Patient: MADDOCK FINIGAN  Procedure(s) Performed: TOTAL HIP ARTHROPLASTY ANTERIOR APPROACH (Right Hip)     Patient location during evaluation: PACU Anesthesia Type: Spinal Level of consciousness: oriented and awake and alert Pain management: pain level controlled Vital Signs Assessment: post-procedure vital signs reviewed and stable Respiratory status: spontaneous breathing, respiratory function stable and patient connected to nasal cannula oxygen Cardiovascular status: blood pressure returned to baseline and stable Postop Assessment: no headache, no backache and no apparent nausea or vomiting Anesthetic complications: no    Last Vitals:  Vitals:   07/09/18 1230 07/09/18 1245  BP:  109/73  Pulse:  67  Resp:  14  Temp: 36.6 C 36.5 C  SpO2:  100%    Last Pain:  Vitals:   07/09/18 1245  TempSrc: Oral  PainSc:                  Zandra Lajeunesse L Oaklynn Stierwalt

## 2018-07-10 ENCOUNTER — Encounter (HOSPITAL_COMMUNITY): Payer: Self-pay | Admitting: Orthopedic Surgery

## 2018-07-10 LAB — CBC
HCT: 38.9 % — ABNORMAL LOW (ref 39.0–52.0)
Hemoglobin: 11.9 g/dL — ABNORMAL LOW (ref 13.0–17.0)
MCH: 29.8 pg (ref 26.0–34.0)
MCHC: 30.6 g/dL (ref 30.0–36.0)
MCV: 97.3 fL (ref 80.0–100.0)
Platelets: 194 10*3/uL (ref 150–400)
RBC: 4 MIL/uL — ABNORMAL LOW (ref 4.22–5.81)
RDW: 13.9 % (ref 11.5–15.5)
WBC: 11.1 10*3/uL — ABNORMAL HIGH (ref 4.0–10.5)
nRBC: 0 % (ref 0.0–0.2)

## 2018-07-10 LAB — BASIC METABOLIC PANEL
Anion gap: 6 (ref 5–15)
BUN: 19 mg/dL (ref 6–20)
CO2: 24 mmol/L (ref 22–32)
Calcium: 8.1 mg/dL — ABNORMAL LOW (ref 8.9–10.3)
Chloride: 108 mmol/L (ref 98–111)
Creatinine, Ser: 0.81 mg/dL (ref 0.61–1.24)
GFR calc Af Amer: >60 mL/min (ref >60)
GFR calc non Af Amer: >60 mL/min (ref >60)
Glucose, Bld: 110 mg/dL — ABNORMAL HIGH (ref 70–99)
Potassium: 4.3 mmol/L (ref 3.5–5.1)
Sodium: 138 mmol/L (ref 135–145)

## 2018-07-10 LAB — GLUCOSE, CAPILLARY
Glucose-Capillary: 120 mg/dL — ABNORMAL HIGH (ref 70–99)
Glucose-Capillary: 147 mg/dL — ABNORMAL HIGH (ref 70–99)
Glucose-Capillary: 207 mg/dL — ABNORMAL HIGH (ref 70–99)
Glucose-Capillary: 167 mg/dL — ABNORMAL HIGH (ref 70–99)

## 2018-07-10 MED ORDER — SENNA 8.6 MG PO TABS: 2.0000 | ORAL_TABLET | Freq: Every day | ORAL | 1 refills | Status: DC

## 2018-07-10 MED ORDER — METHOCARBAMOL 500 MG PO TABS: 500.0000 mg | ORAL_TABLET | Freq: Four times a day (QID) | ORAL | 1 refills | Status: DC | PRN

## 2018-07-10 MED ORDER — ASPIRIN 81 MG PO CHEW: 81.0000 mg | CHEWABLE_TABLET | Freq: Two times a day (BID) | ORAL | 1 refills | Status: DC

## 2018-07-10 MED ORDER — ONDANSETRON HCL 4 MG PO TABS: 4.0000 mg | ORAL_TABLET | Freq: Four times a day (QID) | ORAL | 0 refills | Status: DC | PRN

## 2018-07-10 MED ORDER — HYDROCODONE-ACETAMINOPHEN 5-325 MG PO TABS: 1.0000 | ORAL_TABLET | ORAL | 0 refills | Status: DC | PRN

## 2018-07-10 MED ORDER — DOCUSATE SODIUM 100 MG PO CAPS: 100.0000 mg | ORAL_CAPSULE | Freq: Two times a day (BID) | ORAL | 1 refills | Status: DC

## 2018-07-10 NOTE — Progress Notes (Signed)
Physical Therapy Treatment Patient Details Name: Michael Sandoval MRN: [id number] DOB: 1966-01-29 Today's Date: 07/10/2018    History of Present Illness Pt s/p R THR and with hx of DM    PT Comments    Pt very cooperative and progressing slowly with mobility but requiring increased time for all tasks and ltd by fatigue and c/o dizziness.   Follow Up Recommendations  Follow surgeon's recommendation for DC plan and follow-up therapies     Equipment Recommendations       Recommendations for Other Services OT consult     Precautions / Restrictions Precautions Precautions: Fall Restrictions Weight Bearing Restrictions: No Other Position/Activity Restrictions: WBAT    Mobility  Bed Mobility Overal bed mobility: Needs Assistance Bed Mobility: Supine to Sit     Supine to sit: Min assist;Mod assist;+2 for physical assistance;+2 for safety/equipment     General bed mobility comments: Increased time with cues for sequence and use of L LE to self assist..  Physical assist to manage L LE and to bring trunk to upright position  Transfers Overall transfer level: Needs assistance Equipment used: Rolling walker (2 wheeled) Transfers: Sit to/from Stand Sit to Stand: Min assist;+2 physical assistance;+2 safety/equipment         General transfer comment: cues for LE management and use of UEs to self assist  Ambulation/Gait Ambulation/Gait assistance: Min assist;+2 physical assistance;+2 safety/equipment Gait Distance (Feet): 19 Feet Assistive device: Rolling walker (2 wheeled) Gait Pattern/deviations: Step-to pattern;Decreased step length - right;Decreased step length - left;Decreased stance time - left;Shuffle;Trunk flexed Gait velocity: decr   General Gait Details: Increased time with cues for sequence, posture and position from RW.  Distance ltd by fatigue and c/o dizziness   Stairs             Wheelchair Mobility    Modified Rankin (Stroke Patients Only)       Balance Overall balance assessment: Needs assistance Sitting-balance support: Feet supported;No upper extremity supported Sitting balance-Leahy Scale: Good     Standing balance support: Bilateral upper extremity supported Standing balance-Leahy Scale: Poor                              Cognition Arousal/Alertness: Awake/alert Behavior During Therapy: WFL for tasks assessed/performed Overall Cognitive Status: Within Functional Limits for tasks assessed                                        Exercises Total Joint Exercises Ankle Circles/Pumps: AROM;Both;15 reps;Supine Quad Sets: AROM;Both;10 reps;Supine Heel Slides: AAROM;Right;20 reps;Supine Hip ABduction/ADduction: AAROM;Right;15 reps;Supine    General Comments        Pertinent Vitals/Pain Pain Assessment: 0-10 Pain Score: 5  Pain Location: R hip and thigh Pain Descriptors / Indicators: Aching;Burning;Sore;Tightness Pain Intervention(s): Limited activity within patient's tolerance;Monitored during session;Premedicated before session;Ice applied    Home Living                      Prior Function            PT Goals (current goals can now be found in the care plan section) Acute Rehab PT Goals Patient Stated Goal: Less pain PT Goal Formulation: With patient Time For Goal Achievement: 07/16/18 Potential to Achieve Goals: Good Progress towards PT goals: Progressing toward goals    Frequency    7X/week  PT Plan Current plan remains appropriate    Co-evaluation              AM-PAC PT "6 Clicks" Mobility   Outcome Measure  Help needed turning from your back to your side while in a flat bed without using bedrails?: A Lot Help needed moving from lying on your back to sitting on the side of a flat bed without using bedrails?: A Lot Help needed moving to and from a bed to a chair (including a wheelchair)?: A Lot Help needed standing up from a chair using your arms  (e.g., wheelchair or bedside chair)?: A Lot Help needed to walk in hospital room?: A Little Help needed climbing 3-5 steps with a railing? : A Lot 6 Click Score: 13    End of Session Equipment Utilized During Treatment: Gait belt Activity Tolerance: Patient tolerated treatment well;Patient limited by fatigue Patient left: in chair;with call bell/phone within reach Nurse Communication: Mobility status PT Visit Diagnosis: Difficulty in walking, not elsewhere classified (R26.2);Pain Pain - Right/Left: Right Pain - part of body: Hip     Time: 9622-2979 PT Time Calculation (min) (ACUTE ONLY): 29 min  Charges:  $Gait Training: 8-22 mins $Therapeutic Exercise: 8-22 mins                     Debe Coder PT Acute Rehabilitation Services Pager [phone] Office [phone]    Rickie Gange 07/10/2018, 12:51 PM

## 2018-07-10 NOTE — Progress Notes (Signed)
Physical Therapy Treatment Patient Details Name: Michael Sandoval MRN: [id number] DOB: 02/12/1966 Today's Date: 07/10/2018    History of Present Illness Pt s/p R THR and with hx of DM    PT Comments    Pt cooperative but distance ltd by fatigue and need for BSC.   Follow Up Recommendations  Follow surgeon's recommendation for DC plan and follow-up therapies     Equipment Recommendations       Recommendations for Other Services OT consult     Precautions / Restrictions Precautions Precautions: Fall Restrictions Weight Bearing Restrictions: No Other Position/Activity Restrictions: WBAT    Mobility  Bed Mobility Overal bed mobility: Needs Assistance Bed Mobility: Supine to Sit     Supine to sit: Min assist;Mod assist;+2 for physical assistance;+2 for safety/equipment     General bed mobility comments: Pt up in recliner  Transfers Overall transfer level: Needs assistance Equipment used: Rolling walker (2 wheeled) Transfers: Sit to/from Stand Sit to Stand: Min assist;+2 physical assistance;+2 safety/equipment         General transfer comment: cues for LE management and use of UEs to self assist  Ambulation/Gait Ambulation/Gait assistance: Min assist;+2 safety/equipment Gait Distance (Feet): 18 Feet Assistive device: Rolling walker (2 wheeled) Gait Pattern/deviations: Step-to pattern;Decreased step length - right;Decreased step length - left;Decreased stance time - left;Shuffle;Trunk flexed Gait velocity: decr   General Gait Details: Increased time with cues for sequence, posture and position from RW.     Stairs             Wheelchair Mobility    Modified Rankin (Stroke Patients Only)       Balance Overall balance assessment: Needs assistance Sitting-balance support: Feet supported;No upper extremity supported Sitting balance-Leahy Scale: Good     Standing balance support: Bilateral upper extremity supported Standing balance-Leahy Scale:  Poor                              Cognition Arousal/Alertness: Awake/alert Behavior During Therapy: WFL for tasks assessed/performed Overall Cognitive Status: Within Functional Limits for tasks assessed                                        Exercises Total Joint Exercises Ankle Circles/Pumps: AROM;Both;15 reps;Supine Quad Sets: AROM;Both;10 reps;Supine Heel Slides: AAROM;Right;20 reps;Supine Hip ABduction/ADduction: AAROM;Right;15 reps;Supine    General Comments        Pertinent Vitals/Pain Pain Assessment: 0-10 Pain Score: 5  Pain Location: R hip and thigh Pain Descriptors / Indicators: Aching;Burning;Sore;Tightness Pain Intervention(s): Limited activity within patient's tolerance;Monitored during session    Home Living                      Prior Function            PT Goals (current goals can now be found in the care plan section) Acute Rehab PT Goals Patient Stated Goal: Less pain PT Goal Formulation: With patient Time For Goal Achievement: 07/16/18 Potential to Achieve Goals: Good Progress towards PT goals: Progressing toward goals    Frequency    7X/week      PT Plan Current plan remains appropriate    Co-evaluation              AM-PAC PT "6 Clicks" Mobility   Outcome Measure  Help needed turning from your back to your side  while in a flat bed without using bedrails?: A Lot Help needed moving from lying on your back to sitting on the side of a flat bed without using bedrails?: A Lot Help needed moving to and from a bed to a chair (including a wheelchair)?: A Lot Help needed standing up from a chair using your arms (e.g., wheelchair or bedside chair)?: A Little Help needed to walk in hospital room?: A Little Help needed climbing 3-5 steps with a railing? : A Lot 6 Click Score: 14    End of Session Equipment Utilized During Treatment: Gait belt Activity Tolerance: Patient tolerated treatment well;Patient  limited by fatigue Patient left: Other (comment)(BSC) Nurse Communication: Mobility status PT Visit Diagnosis: Difficulty in walking, not elsewhere classified (R26.2);Pain Pain - Right/Left: Right Pain - part of body: Hip     Time: 1335-1350 PT Time Calculation (min) (ACUTE ONLY): 15 min  Charges:  $Gait Training: 8-22 mins $Therapeutic Exercise: 8-22 mins                     Debe Coder PT Acute Rehabilitation Services Pager [phone] Office [phone]    Carreen Milius 07/10/2018, 2:33 PM

## 2018-07-10 NOTE — Progress Notes (Signed)
    Subjective:  Patient reports pain as moderate to severe.  Denies N/V/CP/SOB. No c/o. Slow progress with PT.  Objective:   VITALS:   Vitals:   07/09/18 1738 07/09/18 2154 07/10/18 0126 07/10/18 0616  BP: 106/73 115/67 106/64 117/75  Pulse: (!) 57 77 73 77  Resp: 16 16 18 16   Temp: 97.9 F (36.6 C) 98.2 F (36.8 C) 98 F (36.7 C) 97.6 F (36.4 C)  TempSrc:  Oral Oral Oral  SpO2: 100% 97% 97% 99%  Weight:      Height:        NAD ABD soft Sensation intact distally Intact pulses distally Dorsiflexion/Plantar flexion intact Incision: dressing C/D/I Compartment soft   Lab Results  Component Value Date   WBC 11.1 (H) 07/10/2018   HGB 11.9 (L) 07/10/2018   HCT 38.9 (L) 07/10/2018   MCV 97.3 07/10/2018   PLT 194 07/10/2018   BMET    Component Value Date/Time   NA 138 07/10/2018 0339   K 4.3 07/10/2018 0339   CL 108 07/10/2018 0339   CO2 24 07/10/2018 0339   GLUCOSE 110 (H) 07/10/2018 0339   BUN 19 07/10/2018 0339   CREATININE 0.81 07/10/2018 0339   CALCIUM 8.1 (L) 07/10/2018 0339   GFRNONAA >60 07/10/2018 0339   GFRAA >60 07/10/2018 0339     Assessment/Plan: 1 Day Post-Op   Principal Problem:   Osteoarthritis of right hip   WBAT with walker DVT ppx: Aspirin, SCDs, TEDS PO pain control PT/OT Dispo: D/C home with HEP when clears therapy, today vs tomorrow   Hilton Cork Rita Prom 07/10/2018, 8:46 AM   Rod Can, MD Cell: [phone] [place] is now [facility]  [place] [address], [address], [place], [place] [address] Phone: [phone] [email or web address] Facebook  [employer]

## 2018-07-10 NOTE — Discharge Summary (Signed)
Physician Discharge Summary  Patient ID: Michael Sandoval MRN: [id number] DOB/AGE: 05-11-1965 53 y.o.  Admit date: 07/09/2018 Discharge date: 07/11/2018  Admission Diagnoses:  Osteoarthritis of right hip  Discharge Diagnoses:  Principal Problem:   Osteoarthritis of right hip   Past Medical History:  Diagnosis Date  . Arthritis   . Cancer ([place])    melanoma skin cancer   . Diabetes mellitus, type II ([place])       . Dyslipidemia   . GERD (gastroesophageal reflux disease)   . HTN (hypertension)   . Pneumonia    1990     Surgeries: Procedure(s): TOTAL HIP ARTHROPLASTY ANTERIOR APPROACH on 07/09/2018   Consultants (if any):   Discharged Condition: Improved  Hospital Course: [name] is an 53 y.o. male who was admitted 07/09/2018 with a diagnosis of Osteoarthritis of right hip and went to the operating room on 07/09/2018 and underwent the above named procedures.    He was given perioperative antibiotics:  Anti-infectives (From admission, onward)   Start     Dose/Rate Route Frequency Ordered Stop   07/09/18 1400  ceFAZolin (ANCEF) IVPB 2g/100 mL premix     2 g 200 mL/hr over 30 Minutes Intravenous Every 6 hours 07/09/18 1249 07/09/18 2053   07/09/18 0600  ceFAZolin (ANCEF) 3 g in dextrose 5 % 50 mL IVPB     3 g 100 mL/hr over 30 Minutes Intravenous On call to O.R. 07/08/18 7846 07/09/18 0810    .  He was given sequential compression devices, early ambulation, and ASA for DVT prophylaxis.  He benefited maximally from the hospital stay and there were no complications.    Recent vital signs:  Vitals:   07/10/18 2229 07/11/18 0613  BP: 130/80 (!) 136/93  Pulse: 75 88  Resp: 16 18  Temp: 98.2 F (36.8 C) 97.7 F (36.5 C)  SpO2: 98% 100%    Recent laboratory studies:  Lab Results  Component Value Date   HGB 12.8 (L) 07/11/2018   HGB 11.9 (L) 07/10/2018   HGB 14.7 06/30/2018   Lab Results  Component Value Date   WBC 15.3 (H) 07/11/2018   PLT 209 07/11/2018    Lab Results  Component Value Date   INR 0.9 06/30/2018   Lab Results  Component Value Date   NA 138 07/10/2018   K 4.3 07/10/2018   CL 108 07/10/2018   CO2 24 07/10/2018   BUN 19 07/10/2018   CREATININE 0.81 07/10/2018   GLUCOSE 110 (H) 07/10/2018    Discharge Medications:   Allergies as of 07/11/2018   No Known Allergies     Medication List    STOP taking these medications   aspirin 81 MG tablet Replaced by:  aspirin 81 MG chewable tablet     TAKE these medications   aspirin 81 MG chewable tablet Chew 1 tablet (81 mg total) by mouth 2 (two) times daily. Replaces:  aspirin 81 MG tablet   cetirizine 10 MG tablet Commonly known as:  ZYRTEC Take 10 mg by mouth as needed for allergies.   docusate sodium 100 MG capsule Commonly known as:  COLACE Take 1 capsule (100 mg total) by mouth 2 (two) times daily.   esomeprazole 20 MG capsule Commonly known as:  NEXIUM Take 20 mg by mouth daily as needed (HEARTBURN).   fluticasone 50 MCG/ACT nasal spray Commonly known as:  FLONASE Place 1 spray into both nostrils daily.   glipiZIDE 10 MG 24 hr tablet Commonly known as:  GLUCOTROL XL Take 10 mg by mouth every evening.   glucose blood test strip 1 each by Other route as needed for other. Use as instructed One Touch Verio (Blood Glucose Test) One Touch Delica Lancets 67R   HYDROcodone-acetaminophen 5-325 MG tablet Commonly known as:  NORCO/VICODIN Take 1 tablet by mouth every 4 (four) hours as needed for moderate pain (pain score 4-6).   ibuprofen 200 MG tablet Commonly known as:  ADVIL Take 400 mg by mouth every 6 (six) hours as needed (PAIN).   lisinopril 10 MG tablet Commonly known as:  ZESTRIL Take 10 mg by mouth daily.   loratadine 10 MG tablet Commonly known as:  CLARITIN Take 10 mg by mouth daily as needed for allergies.   methocarbamol 500 MG tablet Commonly known as:  ROBAXIN Take 1 tablet (500 mg total) by mouth every 6 (six) hours as needed for  muscle spasms.   multivitamin with minerals tablet Take 1 tablet by mouth daily.   ondansetron 4 MG tablet Commonly known as:  ZOFRAN Take 1 tablet (4 mg total) by mouth every 6 (six) hours as needed for nausea.   senna 8.6 MG Tabs tablet Commonly known as:  SENOKOT Take 2 tablets (17.2 mg total) by mouth at bedtime.   Xigduo XR 5-500 MG Tb24 Generic drug:  Dapagliflozin-metFORMIN HCl ER Take 1 tablet by mouth 2 (two) times a day.       Diagnostic Studies: Dg Pelvis Portable  Result Date: 07/09/2018 CLINICAL DATA:  Right total hip arthroplasty. EXAM: PORTABLE PELVIS 1-2 VIEWS COMPARISON:  Right hip x-rays dated August 21, 2012. FINDINGS: The right hip demonstrates a total arthroplasty without evidence of hardware failure or complication. There is expected intra-articular air. There is no fracture or dislocation. The alignment is anatomic. Post-surgical changes noted in the surrounding soft tissues. Mild left hip osteoarthritis. IMPRESSION: 1. Right total hip arthroplasty without acute postoperative complication. Electronically Signed   By: Titus Dubin M.D.   On: 07/09/2018 11:22   Dg C-arm 1-60 Min-no Report  Result Date: 07/09/2018 Fluoroscopy was utilized by the requesting physician.  No radiographic interpretation.   Dg Hip Operative Unilat With Pelvis Right  Result Date: 07/09/2018 CLINICAL DATA:  Right anterior approach total hip replacement. History of osteoarthritis. EXAM: OPERATIVE RIGHT HIP (WITH PELVIS IF PERFORMED) 2 VIEWS TECHNIQUE: Fluoroscopic spot image(s) were submitted for interpretation post-operatively. COMPARISON:  None. FINDINGS: Two spot intraoperative fluoroscopic images of the right hip and lower pelvis are provided for review Images demonstrate the sequela of right total hip replacement. Alignment appears anatomic given AP projection. There is a minimal amount of expected subcutaneous emphysema about the operative site. No radiopaque foreign body. IMPRESSION:  Right total hip replacement without evidence of complication. Electronically Signed   By: Sandi Mariscal M.D.   On: 07/09/2018 10:25    Disposition:     Follow-up Information    Tirsa Gail, Aaron Edelman, MD. Schedule an appointment as soon as possible for a visit in 2 weeks.   Specialty:  Orthopedic Surgery Why:  For wound re-check Contact information: [address] [place] [address] [phone]            Signed: Hilton Cork Pacey Altizer 07/13/2018, 8:30 AM

## 2018-07-10 NOTE — Progress Notes (Signed)
Physical Therapy Treatment Patient Details Name: Michael Sandoval MRN: [id number] DOB: Apr 10, 1965 Today's Date: 07/10/2018    History of Present Illness Pt s/p R THR and with hx of DM    PT Comments    Marked improvement in activity tolerance with pt walking increased distance in halls.  Pt feeling good about progress this date and hopeful for dc home tomorrow.   Follow Up Recommendations  Follow surgeon's recommendation for DC plan and follow-up therapies     Equipment Recommendations       Recommendations for Other Services OT consult     Precautions / Restrictions Precautions Precautions: Fall Restrictions Weight Bearing Restrictions: No Other Position/Activity Restrictions: WBAT    Mobility  Bed Mobility Overal bed mobility: Needs Assistance Bed Mobility: Supine to Sit     Supine to sit: Min assist;Mod assist;+2 for physical assistance;+2 for safety/equipment     General bed mobility comments: Pt up in recliner  Transfers Overall transfer level: Needs assistance Equipment used: Rolling walker (2 wheeled) Transfers: Sit to/from Stand Sit to Stand: Min assist         General transfer comment: cues for LE management and use of UEs to self assist  Ambulation/Gait Ambulation/Gait assistance: Min assist Gait Distance (Feet): 95 Feet Assistive device: Rolling walker (2 wheeled) Gait Pattern/deviations: Step-to pattern;Decreased step length - right;Decreased step length - left;Decreased stance time - left;Shuffle;Trunk flexed Gait velocity: decr   General Gait Details: Increased time with cues for sequence, posture and position from RW.     Stairs             Wheelchair Mobility    Modified Rankin (Stroke Patients Only)       Balance Overall balance assessment: Needs assistance Sitting-balance support: Feet supported;No upper extremity supported Sitting balance-Leahy Scale: Good     Standing balance support: Bilateral upper extremity  supported Standing balance-Leahy Scale: Poor                              Cognition Arousal/Alertness: Awake/alert Behavior During Therapy: WFL for tasks assessed/performed Overall Cognitive Status: Within Functional Limits for tasks assessed                                        Exercises Total Joint Exercises Ankle Circles/Pumps: AROM;Both;15 reps;Supine Quad Sets: AROM;Both;10 reps;Supine Heel Slides: AAROM;Right;20 reps;Supine Hip ABduction/ADduction: AAROM;Right;15 reps;Supine    General Comments        Pertinent Vitals/Pain Pain Assessment: 0-10 Pain Score: 5  Pain Location: R hip and thigh Pain Descriptors / Indicators: Aching;Burning;Sore;Tightness Pain Intervention(s): Limited activity within patient's tolerance;Monitored during session    Home Living                      Prior Function            PT Goals (current goals can now be found in the care plan section) Acute Rehab PT Goals Patient Stated Goal: Regain IND PT Goal Formulation: With patient Time For Goal Achievement: 07/16/18 Potential to Achieve Goals: Good Progress towards PT goals: Progressing toward goals    Frequency    7X/week      PT Plan Current plan remains appropriate    Co-evaluation              AM-PAC PT "6 Clicks" Mobility   Outcome  Measure  Help needed turning from your back to your side while in a flat bed without using bedrails?: A Lot Help needed moving from lying on your back to sitting on the side of a flat bed without using bedrails?: A Lot Help needed moving to and from a bed to a chair (including a wheelchair)?: A Little Help needed standing up from a chair using your arms (e.g., wheelchair or bedside chair)?: A Little Help needed to walk in hospital room?: A Little Help needed climbing 3-5 steps with a railing? : A Lot 6 Click Score: 15    End of Session Equipment Utilized During Treatment: Gait belt Activity  Tolerance: Patient tolerated treatment well;Patient limited by fatigue Patient left: Other (comment)(sitting EOB with CNA for bathing) Nurse Communication: Mobility status PT Visit Diagnosis: Difficulty in walking, not elsewhere classified (R26.2);Pain Pain - Right/Left: Right Pain - part of body: Hip     Time: 1410-1430 PT Time Calculation (min) (ACUTE ONLY): 20 min  Charges:  $Gait Training: 8-22 mins $Therapeutic Exercise: 8-22 mins                     Debe Coder PT Acute Rehabilitation Services Pager [phone] Office [phone]    Megan Hayduk 07/10/2018, 2:38 PM

## 2018-07-11 LAB — CBC
HCT: 39.6 % (ref 39.0–52.0)
Hemoglobin: 12.8 g/dL — ABNORMAL LOW (ref 13.0–17.0)
MCH: 30.6 pg (ref 26.0–34.0)
MCHC: 32.3 g/dL (ref 30.0–36.0)
MCV: 94.7 fL (ref 80.0–100.0)
Platelets: 209 10*3/uL (ref 150–400)
RBC: 4.18 MIL/uL — ABNORMAL LOW (ref 4.22–5.81)
RDW: 13.8 % (ref 11.5–15.5)
WBC: 15.3 10*3/uL — ABNORMAL HIGH (ref 4.0–10.5)
nRBC: 0 % (ref 0.0–0.2)

## 2018-07-11 LAB — GLUCOSE, CAPILLARY: Glucose-Capillary: 134 mg/dL — ABNORMAL HIGH (ref 70–99)

## 2018-07-11 NOTE — Plan of Care (Signed)
Plan of care reviewed and discussed with the patient. 

## 2018-07-11 NOTE — Progress Notes (Signed)
RHYLAND HINDERLITER  MRN: [id number] DOB/Age: [birth year] 53 y.o. [facility] Procedure: Procedure(s) (LRB): TOTAL HIP ARTHROPLASTY ANTERIOR APPROACH (Right)     Subjective: Feeling better today, better pain control. Still hasn't worked with PT this am but thinks he is up to DC home  Vital Signs Temp:  [97.6 F (36.4 C)-98.2 F (36.8 C)] 97.7 F (36.5 C) (05/30 9323) Pulse Rate:  [72-88] 88 (05/30 0613) Resp:  [16-18] 18 (05/30 0613) BP: (120-136)/(70-93) 136/93 (05/30 0613) SpO2:  [95 %-100 %] 100 % (05/30 0613)  Lab Results Recent Labs    07/10/18 0339 07/11/18 0321  WBC 11.1* 15.3*  HGB 11.9* 12.8*  HCT 38.9* 39.6  PLT 194 209   BMET Recent Labs    07/10/18 0339  NA 138  K 4.3  CL 108  CO2 24  GLUCOSE 110*  BUN 19  CREATININE 0.81  CALCIUM 8.1*   INR  Date Value Ref Range Status  06/30/2018 0.9 0.8 - 1.2 Final    Comment:    (NOTE) INR goal varies based on device and disease states. Performed at [facility], [place] [address] [place], [place] [address]      Exam Hip dressing dry NVI to [place] home per previous orders after PT today  Jenetta Loges PA-C  07/11/2018, 8:45 AM Contact # [phone]

## 2018-07-11 NOTE — Progress Notes (Signed)
Physical Therapy Treatment Patient Details Name: Michael Sandoval MRN: [id number] DOB: 1965-06-13 Today's Date: 07/11/2018    History of Present Illness Pt s/p R THR and with hx of DM    PT Comments    Pt with questions prior to dc.  Reviewed lower body dressing and assisted pt to complete dressing for return home.  Reviewed car transfers and shower transfers.  Pt states has RW at home and feels he does not have 3n1.   Follow Up Recommendations  Follow surgeon's recommendation for DC plan and follow-up therapies     Equipment Recommendations       Recommendations for Other Services OT consult     Precautions / Restrictions Precautions Precautions: Fall Restrictions Weight Bearing Restrictions: No Other Position/Activity Restrictions: WBAT    Mobility  Bed Mobility Overal bed mobility: Needs Assistance Bed Mobility: Supine to Sit     Supine to sit: Min assist;Min guard     General bed mobility comments: NT - Pt up in chair and returns to same  Transfers Overall transfer level: Needs assistance Equipment used: Rolling walker (2 wheeled) Transfers: Sit to/from Stand Sit to Stand: Supervision         General transfer comment: cues for LE management and use of UEs to self assist  Ambulation/Gait Ambulation/Gait assistance: Min guard;Supervision Gait Distance (Feet): 150 Feet Assistive device: Rolling walker (2 wheeled) Gait Pattern/deviations: Decreased step length - right;Decreased step length - left;Decreased stance time - left;Shuffle;Trunk flexed;Step-to pattern;Step-through pattern Gait velocity: decr   General Gait Details: Increased time with cues for sequence, posture and position from RW.  Pt progressed to reciprocal gait   Stairs Stairs: Yes Stairs assistance: Min assist Stair Management: Two rails;Step to pattern;Forwards Number of Stairs: 5 General stair comments: cues for sequence    Wheelchair Mobility    Modified Rankin (Stroke Patients  Only)       Balance Overall balance assessment: Needs assistance Sitting-balance support: Feet supported;No upper extremity supported Sitting balance-Leahy Scale: Good     Standing balance support: No upper extremity supported Standing balance-Leahy Scale: Fair                              Cognition Arousal/Alertness: Awake/alert Behavior During Therapy: WFL for tasks assessed/performed Overall Cognitive Status: Within Functional Limits for tasks assessed                                        Exercises Total Joint Exercises Ankle Circles/Pumps: AROM;Both;15 reps;Supine Quad Sets: AROM;Both;10 reps;Supine Heel Slides: AAROM;Right;20 reps;Supine Hip ABduction/ADduction: AAROM;Right;15 reps;Supine Long Arc Quad: AROM;Left;10 reps;Seated    General Comments        Pertinent Vitals/Pain Pain Assessment: 0-10 Pain Score: 4  Pain Location: R hip and thigh Pain Descriptors / Indicators: Aching;Burning;Sore;Tightness Pain Intervention(s): Limited activity within patient's tolerance;Monitored during session;Premedicated before session;Ice applied    Home Living                      Prior Function            PT Goals (current goals can now be found in the care plan section) Acute Rehab PT Goals Patient Stated Goal: Regain IND PT Goal Formulation: With patient Time For Goal Achievement: 07/16/18 Potential to Achieve Goals: Good Progress towards PT goals: Progressing toward goals  Frequency    7X/week      PT Plan Current plan remains appropriate    Co-evaluation              AM-PAC PT "6 Clicks" Mobility   Outcome Measure  Help needed turning from your back to your side while in a flat bed without using bedrails?: A Little Help needed moving from lying on your back to sitting on the side of a flat bed without using bedrails?: A Little Help needed moving to and from a bed to a chair (including a wheelchair)?: A  Little Help needed standing up from a chair using your arms (e.g., wheelchair or bedside chair)?: A Little Help needed to walk in hospital room?: A Little Help needed climbing 3-5 steps with a railing? : A Little 6 Click Score: 18    End of Session Equipment Utilized During Treatment: Gait belt Activity Tolerance: Patient tolerated treatment well Patient left: in chair;with call bell/phone within reach Nurse Communication: Mobility status PT Visit Diagnosis: Difficulty in walking, not elsewhere classified (R26.2);Pain Pain - Right/Left: Right Pain - part of body: Hip     Time: 1194-1740 PT Time Calculation (min) (ACUTE ONLY): 15 min  Charges:  $Gait Training: 8-22 mins $Therapeutic Exercise: 8-22 mins $Therapeutic Activity: 8-22 mins                     [place] Pager [phone] Office [phone]    Cherrelle Plante 07/11/2018, 1:42 PM

## 2018-07-11 NOTE — Progress Notes (Signed)
DC instructions and paper work provided and explained to member in detail; Physical Therapy at bedside as well to assist with questions regarding mobility in home; patient verbalizes understanding; escorted to lobby in wheel chair to be discharged home with brother.

## 2018-07-11 NOTE — Progress Notes (Signed)
Physical Therapy Treatment Patient Details Name: Michael Sandoval MRN: [id number] DOB: 05-13-1965 Today's Date: 07/11/2018    History of Present Illness Pt s/p R THR and with hx of DM    PT Comments    Pt continues to progress well with mobility.  Pt reviewed stairs and home therex program with written instruction and progression provided.   Follow Up Recommendations  Follow surgeon's recommendation for DC plan and follow-up therapies     Equipment Recommendations       Recommendations for Other Services OT consult     Precautions / Restrictions Precautions Precautions: Fall Restrictions Weight Bearing Restrictions: No Other Position/Activity Restrictions: WBAT    Mobility  Bed Mobility Overal bed mobility: Needs Assistance Bed Mobility: Supine to Sit     Supine to sit: Min assist;Min guard     General bed mobility comments: increased time but limited physical assist for L LE management only  Transfers Overall transfer level: Needs assistance Equipment used: Rolling walker (2 wheeled) Transfers: Sit to/from Stand Sit to Stand: Min guard;Supervision         General transfer comment: cues for LE management and use of UEs to self assist  Ambulation/Gait Ambulation/Gait assistance: Min guard;Supervision Gait Distance (Feet): 150 Feet Assistive device: Rolling walker (2 wheeled) Gait Pattern/deviations: Decreased step length - right;Decreased step length - left;Decreased stance time - left;Shuffle;Trunk flexed;Step-to pattern;Step-through pattern Gait velocity: decr   General Gait Details: Increased time with cues for sequence, posture and position from RW.  Pt progressed to reciprocal gait   Stairs Stairs: Yes Stairs assistance: Min assist Stair Management: Two rails;Step to pattern;Forwards Number of Stairs: 5 General stair comments: cues for sequence    Wheelchair Mobility    Modified Rankin (Stroke Patients Only)       Balance Overall balance  assessment: Needs assistance Sitting-balance support: Feet supported;No upper extremity supported Sitting balance-Leahy Scale: Good     Standing balance support: No upper extremity supported Standing balance-Leahy Scale: Fair                              Cognition Arousal/Alertness: Awake/alert Behavior During Therapy: WFL for tasks assessed/performed Overall Cognitive Status: Within Functional Limits for tasks assessed                                        Exercises Total Joint Exercises Ankle Circles/Pumps: AROM;Both;15 reps;Supine Quad Sets: AROM;Both;10 reps;Supine Heel Slides: AAROM;Right;20 reps;Supine Hip ABduction/ADduction: AAROM;Right;15 reps;Supine Long Arc Quad: AROM;Left;10 reps;Seated    General Comments        Pertinent Vitals/Pain Pain Assessment: 0-10 Pain Score: 4  Pain Location: R hip and thigh Pain Descriptors / Indicators: Aching;Burning;Sore;Tightness Pain Intervention(s): Limited activity within patient's tolerance;Monitored during session;Premedicated before session;Ice applied    Home Living                      Prior Function            PT Goals (current goals can now be found in the care plan section) Acute Rehab PT Goals Patient Stated Goal: Regain IND PT Goal Formulation: With patient Time For Goal Achievement: 07/16/18 Potential to Achieve Goals: Good Progress towards PT goals: Progressing toward goals    Frequency    7X/week      PT Plan Current plan remains appropriate  Co-evaluation              AM-PAC PT "6 Clicks" Mobility   Outcome Measure  Help needed turning from your back to your side while in a flat bed without using bedrails?: A Little Help needed moving from lying on your back to sitting on the side of a flat bed without using bedrails?: A Little Help needed moving to and from a bed to a chair (including a wheelchair)?: A Little Help needed standing up from a  chair using your arms (e.g., wheelchair or bedside chair)?: A Little Help needed to walk in hospital room?: A Little Help needed climbing 3-5 steps with a railing? : A Little 6 Click Score: 18    End of Session Equipment Utilized During Treatment: Gait belt Activity Tolerance: Patient tolerated treatment well;Patient limited by fatigue Patient left: Other (comment) Nurse Communication: Mobility status PT Visit Diagnosis: Difficulty in walking, not elsewhere classified (R26.2);Pain Pain - Right/Left: Right Pain - part of body: Hip     Time: 2119-4174 PT Time Calculation (min) (ACUTE ONLY): 50 min  Charges:  $Gait Training: 8-22 mins $Therapeutic Exercise: 8-22 mins $Therapeutic Activity: 8-22 mins                     Debe Coder PT Acute Rehabilitation Services Pager [phone] Office [phone]    Divine Imber 07/11/2018, 1:39 PM

## 2018-12-31 DIAGNOSIS — Z96649 Presence of unspecified artificial hip joint: Secondary | ICD-10-CM | POA: Insufficient documentation

## 2019-01-20 ENCOUNTER — Other Ambulatory Visit: Payer: Self-pay

## 2019-01-20 DIAGNOSIS — Z20822 Contact with and (suspected) exposure to covid-19: Secondary | ICD-10-CM

## 2019-01-21 LAB — NOVEL CORONAVIRUS, NAA: SARS-CoV-2, NAA: DETECTED — AB

## 2019-01-22 ENCOUNTER — Other Ambulatory Visit: Payer: Self-pay | Admitting: Infectious Diseases

## 2019-01-22 ENCOUNTER — Telehealth: Payer: Self-pay | Admitting: Nurse Practitioner

## 2019-01-22 ENCOUNTER — Telehealth: Payer: Self-pay | Admitting: Infectious Diseases

## 2019-01-22 DIAGNOSIS — Z6839 Body mass index (BMI) 39.0-39.9, adult: Secondary | ICD-10-CM

## 2019-01-22 DIAGNOSIS — I10 Essential (primary) hypertension: Secondary | ICD-10-CM

## 2019-01-22 DIAGNOSIS — U071 COVID-19: Secondary | ICD-10-CM

## 2019-01-22 DIAGNOSIS — E119 Type 2 diabetes mellitus without complications: Secondary | ICD-10-CM

## 2019-01-22 NOTE — Telephone Encounter (Signed)
Called to Discuss with patient about Covid symptoms and the use of bamlanivimab, a monoclonal antibody infusion for those with mild to moderate Covid symptoms and at a high risk of hospitalization.     Pt is qualified for this infusion at the [facility] infusion center due to co-morbid conditions and/or a member of an at-risk group.     Unable to reach pt  

## 2019-01-22 NOTE — Telephone Encounter (Signed)
Referral from [place] to contact patient:   Discussed with patient about Covid symptoms and the use of bamlanivimab, a monoclonal antibody infusion for those with mild to moderate Covid symptoms and at a high risk of hospitalization.  Pt is qualified for this infusion at the [facility] infusion center due to Age >55, Diabetes, Hypertension and BMI>35 which were addressed with the patient and are actively being managed by a [facility] Internal Medicine provider.    Symptoms started 12/07 afternoon. Wednesday AM he felt like he had the flu and test + 12/09. He still has headaches, congestion,  aches and pain. Mild shortness of breath but nothing severe.   After discussing the infusion's costs, potential benefits and side effects, the patient has decided to accept treatment with monoclonal antibodies.

## 2019-01-22 NOTE — Progress Notes (Signed)
  I connected by phone with Michael Sandoval on 01/22/2019 at 4:01 PM to discuss the potential use of an new treatment for mild to moderate COVID-19 viral infection in non-hospitalized patients.  This patient is a 53 y.o. male that meets the FDA criteria for Emergency Use Authorization of bamlanivimab or casirivimab\imdevimab.  Has a (+) direct SARS-CoV-2 viral test result  Has mild or moderate COVID-19   Is ? 55 years of age and weighs ? 40 kg  Is NOT hospitalized due to COVID-19  Is NOT requiring oxygen therapy or requiring an increase in baseline oxygen flow rate due to COVID-19  Is within 10 days of symptom onset  Has at least one of the high risk factor(s) for progression to severe COVID-19 and/or hospitalization as defined in EUA.  Specific high risk criteria : BMI >/= 35, Age 64 + Diabetes, HTN   I have spoken and communicated the following to the patient or parent/caregiver:  1. FDA has authorized the emergency use of bamlanivimab and casirivimab\imdevimab for the treatment of mild to moderate COVID-19 in adults and pediatric patients with positive results of direct SARS-CoV-2 viral testing who are 51 years of age and older weighing at least 40 kg, and who are at high risk for progressing to severe COVID-19 and/or hospitalization.  2. The significant known and potential risks and benefits of bamlanivimab and casirivimab\imdevimab, and the extent to which such potential risks and benefits are unknown.  3. Information on available alternative treatments and the risks and benefits of those alternatives, including clinical trials.  4. Patients treated with bamlanivimab and casirivimab\imdevimab should continue to self-isolate and use infection control measures (e.g., wear mask, isolate, social distance, avoid sharing personal items, clean and disinfect "high touch" surfaces, and frequent handwashing) according to CDC guidelines.   5. The patient or parent/caregiver has the option to  accept or refuse bamlanivimab or casirivimab\imdevimab .  After reviewing this information with the patient, The patient agreed to proceed with receiving the bamlanimivab infusion and will be provided a copy of the Fact sheet prior to receiving the infusion.   [name] 01/22/2019 4:01 PM

## 2019-01-25 ENCOUNTER — Ambulatory Visit (HOSPITAL_COMMUNITY)
Admission: RE | Admit: 2019-01-25 | Discharge: 2019-01-25 | Disposition: A | Discharge status: Home / Self Care | Payer: 59 | Source: Ambulatory Visit | Attending: Critical Care Medicine | Admitting: Critical Care Medicine

## 2019-01-25 DIAGNOSIS — Z6839 Body mass index (BMI) 39.0-39.9, adult: Secondary | ICD-10-CM

## 2019-01-25 DIAGNOSIS — I10 Essential (primary) hypertension: Secondary | ICD-10-CM | POA: Diagnosis present

## 2019-01-25 DIAGNOSIS — U071 COVID-19: Secondary | ICD-10-CM | POA: Insufficient documentation

## 2019-01-25 DIAGNOSIS — E119 Type 2 diabetes mellitus without complications: Secondary | ICD-10-CM | POA: Diagnosis present

## 2019-01-25 MED ORDER — FAMOTIDINE IN NACL 20-0.9 MG/50ML-% IV SOLN: 20.0000 mg | Freq: Once | INTRAVENOUS | Status: DC | PRN

## 2019-01-25 MED ORDER — SODIUM CHLORIDE 0.9 % IV SOLN
INTRAVENOUS | Status: DC | PRN
  Administered 2019-01-25: 09:00:00 1000 mL via INTRAVENOUS

## 2019-01-25 MED ORDER — EPINEPHRINE 0.3 MG/0.3ML IJ SOAJ: 0.3000 mg | Freq: Once | INTRAMUSCULAR | Status: DC | PRN

## 2019-01-25 MED ORDER — DIPHENHYDRAMINE HCL 50 MG/ML IJ SOLN: 50.0000 mg | Freq: Once | INTRAMUSCULAR | Status: DC | PRN

## 2019-01-25 MED ORDER — SODIUM CHLORIDE 0.9 % IV SOLN
700.0000 mg | Freq: Once | INTRAVENOUS | Status: AC
  Administered 2019-01-25: 700 mg via INTRAVENOUS
  Filled 2019-01-25: qty 20

## 2019-01-25 MED ORDER — METHYLPREDNISOLONE SODIUM SUCC 125 MG IJ SOLR: 125.0000 mg | Freq: Once | INTRAMUSCULAR | Status: DC | PRN

## 2019-01-25 MED ORDER — ALBUTEROL SULFATE HFA 108 (90 BASE) MCG/ACT IN AERS: 2.0000 | INHALATION_SPRAY | Freq: Once | RESPIRATORY_TRACT | Status: DC | PRN

## 2019-01-25 NOTE — Progress Notes (Signed)
Patient ID: Michael Sandoval, male   DOB: July 28, 1965, 53 y.o.   MRN: [id number]    Diagnosis: [id number]   Procedure: [facility]fusion [facility] Med: bamlanivimab infusion - Provided patient with bamlanimivab fact sheet for patients, parents and caregivers prior to infusion.  Complications: No immediate complications noted.  Discharge: Discharged home   Estrella Deeds 01/25/2019

## 2019-05-12 ENCOUNTER — Other Ambulatory Visit: Payer: Self-pay

## 2019-05-12 ENCOUNTER — Ambulatory Visit
Admission: RE | Admit: 2019-05-12 | Discharge: 2019-05-12 | Disposition: A | Discharge status: Home / Self Care | Payer: 59 | Source: Ambulatory Visit | Attending: Family Medicine | Admitting: Family Medicine

## 2019-05-12 ENCOUNTER — Other Ambulatory Visit: Payer: Self-pay | Admitting: Family Medicine

## 2019-05-12 DIAGNOSIS — S81802A Unspecified open wound, left lower leg, initial encounter: Secondary | ICD-10-CM

## 2020-01-13 ENCOUNTER — Other Ambulatory Visit: Payer: Self-pay

## 2020-01-13 ENCOUNTER — Other Ambulatory Visit: Payer: 59

## 2020-01-13 DIAGNOSIS — Z20822 Contact with and (suspected) exposure to covid-19: Secondary | ICD-10-CM

## 2020-01-14 LAB — SARS-COV-2, NAA 2 DAY TAT

## 2020-01-14 LAB — NOVEL CORONAVIRUS, NAA: SARS-CoV-2, NAA: NOT DETECTED

## 2020-01-29 ENCOUNTER — Ambulatory Visit: Payer: 59

## 2020-02-03 ENCOUNTER — Ambulatory Visit: Payer: 59 | Attending: Internal Medicine

## 2020-02-03 DIAGNOSIS — Z23 Encounter for immunization: Secondary | ICD-10-CM

## 2020-02-03 NOTE — Progress Notes (Signed)
   Covid-19 Vaccination Clinic  Name:  Michael Sandoval    MRN: [id number] DOB: 1966/02/02  02/03/2020  Mr. Michael Sandoval was observed post Covid-19 immunization for 15 minutes without incident. He was provided with Vaccine Information Sheet and instruction to access the V-Safe system.   Mr. Michael Sandoval was instructed to call 911 with any severe reactions post vaccine: Marland Kitchen Difficulty breathing  . Swelling of face and throat  . A fast heartbeat  . A bad rash all over body  . Dizziness and weakness   Immunizations Administered    Name Date Dose VIS Date Route   Pfizer COVID-19 Vaccine 02/03/2020  1:51 PM 0.3 mL 12/01/2019 Intramuscular   Manufacturer: [place]   Lot: [id number]   [place]: [id number]

## 2020-02-14 ENCOUNTER — Other Ambulatory Visit: Payer: 59

## 2021-01-24 ENCOUNTER — Encounter (HOSPITAL_BASED_OUTPATIENT_CLINIC_OR_DEPARTMENT_OTHER): Payer: Self-pay | Admitting: Emergency Medicine

## 2021-01-24 ENCOUNTER — Emergency Department (HOSPITAL_BASED_OUTPATIENT_CLINIC_OR_DEPARTMENT_OTHER): Payer: 59

## 2021-01-24 ENCOUNTER — Emergency Department (HOSPITAL_BASED_OUTPATIENT_CLINIC_OR_DEPARTMENT_OTHER)
Admission: EM | Admit: 2021-01-24 | Discharge: 2021-01-24 | Disposition: A | Discharge status: Home / Self Care | Payer: 59 | Attending: Emergency Medicine | Admitting: Emergency Medicine

## 2021-01-24 ENCOUNTER — Other Ambulatory Visit: Payer: Self-pay

## 2021-01-24 DIAGNOSIS — E119 Type 2 diabetes mellitus without complications: Secondary | ICD-10-CM | POA: Insufficient documentation

## 2021-01-24 DIAGNOSIS — Z79899 Other long term (current) drug therapy: Secondary | ICD-10-CM | POA: Insufficient documentation

## 2021-01-24 DIAGNOSIS — I10 Essential (primary) hypertension: Secondary | ICD-10-CM | POA: Insufficient documentation

## 2021-01-24 DIAGNOSIS — Z7982 Long term (current) use of aspirin: Secondary | ICD-10-CM | POA: Diagnosis not present

## 2021-01-24 DIAGNOSIS — R079 Chest pain, unspecified: Secondary | ICD-10-CM | POA: Diagnosis present

## 2021-01-24 DIAGNOSIS — Z96641 Presence of right artificial hip joint: Secondary | ICD-10-CM | POA: Insufficient documentation

## 2021-01-24 DIAGNOSIS — Z85828 Personal history of other malignant neoplasm of skin: Secondary | ICD-10-CM | POA: Insufficient documentation

## 2021-01-24 DIAGNOSIS — U071 COVID-19: Secondary | ICD-10-CM | POA: Diagnosis not present

## 2021-01-24 LAB — COMPREHENSIVE METABOLIC PANEL
ALT: 69 U/L — ABNORMAL HIGH (ref 0–44)
AST: 59 U/L — ABNORMAL HIGH (ref 15–41)
Albumin: 4.3 g/dL (ref 3.5–5.0)
Alkaline Phosphatase: 74 U/L (ref 38–126)
Anion gap: 11 (ref 5–15)
BUN: 14 mg/dL (ref 6–20)
CO2: 25 mmol/L (ref 22–32)
Calcium: 9.3 mg/dL (ref 8.9–10.3)
Chloride: 95 mmol/L — ABNORMAL LOW (ref 98–111)
Creatinine, Ser: 0.98 mg/dL (ref 0.61–1.24)
GFR, Estimated: >60 mL/min (ref >60)
Glucose, Bld: 416 mg/dL — ABNORMAL HIGH (ref 70–99)
Potassium: 5.1 mmol/L (ref 3.5–5.1)
Sodium: 131 mmol/L — ABNORMAL LOW (ref 135–145)
Total Bilirubin: 0.7 mg/dL (ref 0.3–1.2)
Total Protein: 7.2 g/dL (ref 6.5–8.1)

## 2021-01-24 LAB — RESP PANEL BY RT-PCR (FLU A&B, COVID) ARPGX2
Influenza A by PCR: NEGATIVE
Influenza B by PCR: NEGATIVE
SARS Coronavirus 2 by RT PCR: POSITIVE — AB

## 2021-01-24 LAB — CBC WITH DIFFERENTIAL/PLATELET
Abs Immature Granulocytes: 0.1 10*3/uL — ABNORMAL HIGH (ref 0.00–0.07)
Basophils Absolute: 0.1 10*3/uL (ref 0.0–0.1)
Basophils Relative: 1 %
Eosinophils Absolute: 0.2 10*3/uL (ref 0.0–0.5)
Eosinophils Relative: 2 %
HCT: 46.6 % (ref 39.0–52.0)
Hemoglobin: 15.7 g/dL (ref 13.0–17.0)
Immature Granulocytes: 1 %
Lymphocytes Relative: 30 %
Lymphs Abs: 3.6 10*3/uL (ref 0.7–4.0)
MCH: 29.7 pg (ref 26.0–34.0)
MCHC: 33.7 g/dL (ref 30.0–36.0)
MCV: 88.3 fL (ref 80.0–100.0)
Monocytes Absolute: 0.7 10*3/uL (ref 0.1–1.0)
Monocytes Relative: 5 %
Neutro Abs: 7.3 10*3/uL (ref 1.7–7.7)
Neutrophils Relative %: 61 %
Platelets: 280 10*3/uL (ref 150–400)
RBC: 5.28 MIL/uL (ref 4.22–5.81)
RDW: 13.2 % (ref 11.5–15.5)
WBC: 11.9 10*3/uL — ABNORMAL HIGH (ref 4.0–10.5)
nRBC: 0 % (ref 0.0–0.2)

## 2021-01-24 LAB — BRAIN NATRIURETIC PEPTIDE: B Natriuretic Peptide: 19.2 pg/mL (ref 0.0–100.0)

## 2021-01-24 LAB — MAGNESIUM: Magnesium: 2 mg/dL (ref 1.7–2.4)

## 2021-01-24 LAB — D-DIMER, QUANTITATIVE: D-Dimer, Quant: 0.49 ug/mL-FEU (ref 0.00–0.50)

## 2021-01-24 LAB — TROPONIN I (HIGH SENSITIVITY)
Troponin I (High Sensitivity): 4 ng/L (ref <18)
Troponin I (High Sensitivity): 4 ng/L (ref <18)

## 2021-01-24 LAB — CBG MONITORING, ED: Glucose-Capillary: 316 mg/dL — ABNORMAL HIGH (ref 70–99)

## 2021-01-24 MED ORDER — KETOROLAC TROMETHAMINE 15 MG/ML IJ SOLN
15.0000 mg | Freq: Once | INTRAMUSCULAR | Status: AC
  Administered 2021-01-24: 15:00:00 15 mg via INTRAVENOUS
  Filled 2021-01-24: qty 1

## 2021-01-24 MED ORDER — SODIUM CHLORIDE 0.9 % IV BOLUS
1000.0000 mL | Freq: Once | INTRAVENOUS | Status: AC
  Administered 2021-01-24: 15:00:00 1000 mL via INTRAVENOUS

## 2021-01-24 NOTE — Discharge Instructions (Addendum)
Please follow up with PCP regarding elevated blood sugar and mildly elevated liver enzymes.

## 2021-01-24 NOTE — ED Provider Notes (Signed)
[facility] EMERGENCY DEPT Provider Note   CSN: [id number] Arrival date & time: 01/24/21  1343     History Chief Complaint  Patient presents with   Shortness of Breath    Michael Sandoval is a 55 y.o. male.  This is a 55 y.o. male with significant medical history as below, including arthritis, DM GERD, HTN, HLD who presents to the ED with complaint of cp, dyspnea. Pt with recent covid 19 diagnosis, he completed paxlovid. Initially symptoms improved but over the past 3-4 days the symptoms have returned. He has a pressure/heaviness sensation constantly to his mid sternum, not exacerbated by exertion or deep inspiration. He has mild dib a/w his cp. No fevers or chills. Pain not a/w diaphoresis, light headedness, nausea or palpitations. He is currently symptomatic. Pain is improved with lying and and with rest.   He is tolerating PO appropriately, no change to bowel/bladder fxn. No rashes. No IVDU, no other acute complaints offered   The history is provided by the patient. No language interpreter was used.  Shortness of Breath Associated symptoms: chest pain   Associated symptoms: no abdominal pain, no cough, no fever, no headaches, no rash and no vomiting       Past Medical History:  Diagnosis Date   Arthritis    Cancer ([place])    melanoma skin cancer    Diabetes mellitus, type II ([place])        Dyslipidemia    GERD (gastroesophageal reflux disease)    HTN (hypertension)    Pneumonia    1990     Patient Active Problem List   Diagnosis Date Noted   Osteoarthritis of right hip 07/09/2018    Past Surgical History:  Procedure Laterality Date   colonoscopy   2006   HAND SURGERY     crush injury , small screw in place per surgeon    skin cancer   several years ago   [place] Right 07/09/2018   Procedure: TOTAL HIP ARTHROPLASTY ANTERIOR APPROACH;  Surgeon: Rod Can, MD;  Location: [facility];  Service:  Orthopedics;  Laterality: Right;       Family History  Problem Relation Age of Onset   Heart attack Mother 63   Hypertension Father     Social History   Tobacco Use   Smoking status: Never   Smokeless tobacco: Never  Substance Use Topics   Alcohol use: Not Currently   Drug use: No    Home Medications Prior to Admission medications   Medication Sig Start Date End Date Taking? Authorizing Provider  aspirin 81 MG chewable tablet Chew 1 tablet (81 mg total) by mouth 2 (two) times daily. 07/10/18   Swinteck, Aaron Edelman, MD  cetirizine (ZYRTEC) 10 MG tablet Take 10 mg by mouth as needed for allergies.    [provider]  docusate sodium (COLACE) 100 MG capsule Take 1 capsule (100 mg total) by mouth 2 (two) times daily. 07/10/18   Swinteck, Aaron Edelman, MD  esomeprazole (NEXIUM) 20 MG capsule Take 20 mg by mouth daily as needed (HEARTBURN).     [provider]  fluticasone (FLONASE) 50 MCG/ACT nasal spray Place 1 spray into both nostrils daily.    [provider]  glipiZIDE (GLUCOTROL XL) 10 MG 24 hr tablet Take 10 mg by mouth every evening.  07/20/15   [provider]  glucose blood test strip 1 each by Other route as needed for other. Use as instructed  One Touch Verio (Blood Glucose Test) One Touch Delica Lancets 84O    [provider]  HYDROcodone-acetaminophen (NORCO/VICODIN) 5-325 MG tablet Take 1 tablet by mouth every 4 (four) hours as needed for moderate pain (pain score 4-6). 07/10/18   Swinteck, Aaron Edelman, MD  ibuprofen (ADVIL,MOTRIN) 200 MG tablet Take 400 mg by mouth every 6 (six) hours as needed (PAIN).     [provider]  lisinopril (PRINIVIL,ZESTRIL) 10 MG tablet Take 10 mg by mouth daily. 07/20/15   [provider]  loratadine (CLARITIN) 10 MG tablet Take 10 mg by mouth daily as needed for allergies.    [provider]  methocarbamol (ROBAXIN) 500 MG tablet Take 1 tablet (500 mg total) by mouth every 6 (six) hours as  needed for muscle spasms. 07/10/18   Swinteck, Aaron Edelman, MD  Multiple Vitamins-Minerals (MULTIVITAMIN WITH MINERALS) tablet Take 1 tablet by mouth daily.    [provider]  ondansetron (ZOFRAN) 4 MG tablet Take 1 tablet (4 mg total) by mouth every 6 (six) hours as needed for nausea. 07/10/18   Swinteck, Aaron Edelman, MD  senna (SENOKOT) 8.6 MG TABS tablet Take 2 tablets (17.2 mg total) by mouth at bedtime. 07/10/18   Swinteck, Aaron Edelman, MD  XIGDUO XR 5-500 MG TB24 Take 1 tablet by mouth 2 (two) times a day.  02/09/18   [provider]    Allergies    Onion and Other  Review of Systems   Review of Systems  Constitutional:  Negative for chills and fever.  HENT:  Negative for facial swelling and trouble swallowing.   Eyes:  Negative for photophobia and visual disturbance.  Respiratory:  Positive for shortness of breath. Negative for cough.   Cardiovascular:  Positive for chest pain. Negative for palpitations.  Gastrointestinal:  Negative for abdominal pain, nausea and vomiting.  Endocrine: Negative for polydipsia and polyuria.  Genitourinary:  Negative for difficulty urinating and hematuria.  Musculoskeletal:  Negative for gait problem and joint swelling.  Skin:  Negative for pallor and rash.  Neurological:  Negative for syncope and headaches.  Psychiatric/Behavioral:  Negative for agitation and confusion.    Physical Exam Updated Vital Signs BP 121/79    Pulse (!) 101    Temp 98.1 F (36.7 C)    Resp 17    Ht 5\' 9"  (1.753 m)    Wt 116.1 kg    SpO2 99%    BMI 37.80 kg/m   Physical Exam Vitals and nursing note reviewed.  Constitutional:      General: He is not in acute distress.    Appearance: He is well-developed. He is not ill-appearing or diaphoretic.  HENT:     Head: Normocephalic and atraumatic.     Right Ear: External ear normal.     Left Ear: External ear normal.     Mouth/Throat:     Mouth: Mucous membranes are moist.  Eyes:     General: No scleral  icterus. Cardiovascular:     Rate and Rhythm: Normal rate and regular rhythm.     Pulses: Normal pulses.     Heart sounds: Normal heart sounds.    No friction rub. No gallop.  Pulmonary:     Effort: Pulmonary effort is normal. No tachypnea, accessory muscle usage or respiratory distress.     Breath sounds: Normal breath sounds. No decreased breath sounds.  Abdominal:     General: Abdomen is flat.     Palpations: Abdomen is soft.     Tenderness: There is  no abdominal tenderness.  Musculoskeletal:        General: Normal range of motion.     Cervical back: Normal range of motion.     Right lower leg: No edema.     Left lower leg: No edema.  Skin:    General: Skin is warm and dry.     Capillary Refill: Capillary refill takes less than 2 seconds.  Neurological:     Mental Status: He is alert and oriented to person, place, and time.  Psychiatric:        Mood and Affect: Mood normal.        Behavior: Behavior normal.    ED Results / Procedures / Treatments   Labs (all labs ordered are listed, but only abnormal results are displayed) Labs Reviewed  RESP PANEL BY RT-PCR (FLU A&B, COVID) ARPGX2 - Abnormal; Notable for the following components:      Result Value   SARS Coronavirus 2 by RT PCR POSITIVE (*)    All other components within normal limits  CBC WITH DIFFERENTIAL/PLATELET - Abnormal; Notable for the following components:   WBC 11.9 (*)    Abs Immature Granulocytes 0.10 (*)    All other components within normal limits  COMPREHENSIVE METABOLIC PANEL - Abnormal; Notable for the following components:   Sodium 131 (*)    Chloride 95 (*)    Glucose, Bld 416 (*)    AST 59 (*)    ALT 69 (*)    All other components within normal limits  BRAIN NATRIURETIC PEPTIDE  D-DIMER, QUANTITATIVE  MAGNESIUM  TROPONIN I (HIGH SENSITIVITY)    EKG EKG Interpretation  Date/Time:  Wednesday January 24 2021 14:47:16 EST Ventricular Rate:  102 PR Interval:  164 QRS Duration: 79 QT  Interval:  337 QTC Calculation: 439 R Axis:   26 Text Interpretation: Sinus tachycardia Low voltage, precordial leads Confirmed by Thamas Jaegers ([id number]) on 01/24/2021 3:16:14 PM  Radiology DG Chest Portable 1 View  Result Date: 01/24/2021 CLINICAL DATA:  dib covid EXAM: PORTABLE CHEST 1 VIEW COMPARISON:  None. FINDINGS: Elevated right hemidiaphragm. No consolidation. No visible pleural effusions or pneumothorax. Cardiomediastinal silhouette is within normal limits. IMPRESSION: No evidence of acute cardiopulmonary disease. Electronically Signed   By: Margaretha Sheffield M.D.   On: 01/24/2021 14:44    Procedures Procedures   Medications Ordered in ED Medications  sodium chloride 0.9 % bolus 1,000 mL (1,000 mLs Intravenous New Bag/Given 01/24/21 1505)  ketorolac (TORADOL) 15 MG/ML injection 15 mg (15 mg Intravenous Given 01/24/21 1505)    ED Course  I have reviewed the triage vital signs and the nursing notes.  Pertinent labs & imaging results that were available during my care of the patient were reviewed by me and considered in my medical decision making (see chart for details).  Clinical Course as of 01/24/21 1545  Wed Jan 24, 2021  1515 SO: COVID symptoms start on 12/2, now c/o aches, fatigue, SOB, CP [JH]    Clinical Course User Index [JH] [place], Greggory Brandy, MD   MDM Rules/Calculators/A&P                           CC: cp, dib  This patient complains of above; this involves an extensive number of treatment options and is a complaint that carries with it a high risk of complications and morbidity. Vital signs were reviewed. Serious etiologies considered.  Record review:  Previous records obtained and reviewed  Work up as above, notable for:  Labs & imaging results that were available during my care of the patient were reviewed by me and considered in my medical decision making.   I ordered imaging studies which included CXR and I independently visualized and interpreted  imaging which showed no acute process  EKG without acute ischemia, initial trop was negative, CXR negative. HEAR score is low, will obtain delta trop.  Low risk well's score, obtain d-dimer, this was negative. PE is unlikely  Management: Given IVF and toradol  Pt pending final lab results and re-assessment at time of hand off. Pt overall is well appearing and symptoms are likely 2/2 recent covid 19 infection. If delta trop is negative and symptoms resolved would favor discharge and o/p f/u with PCP with strict return precautions and guidance regarding supportive care.  Further care signed out to Dr Almyra Free at time of handoff.           This chart was dictated using voice recognition software.  Despite best efforts to proofread,  errors can occur which can change the documentation meaning.   Final Clinical Impression(s) / ED Diagnoses Final diagnoses:  Chest pain, unspecified type  COVID-19    Rx / DC Orders ED Discharge Orders     None        Jeanell Sparrow, DO 01/24/21 1545

## 2021-01-24 NOTE — ED Notes (Signed)
ED Provider at bedside. 

## 2021-01-24 NOTE — ED Triage Notes (Signed)
Pt arrives to ED with c/o SOB. Pt reports he was recently tested positive for COVID on 12/2. Pt reports that the SOB started x4 days ago. Pt reports that the SOB has worsened and he is now experiencing DOE. Associated symptoms include dizziness.

## 2021-05-09 ENCOUNTER — Encounter: Payer: Self-pay | Admitting: Neurology

## 2021-06-04 ENCOUNTER — Ambulatory Visit: Payer: 59 | Admitting: Neurology

## 2021-07-02 ENCOUNTER — Encounter: Payer: Self-pay | Admitting: Neurology

## 2021-07-02 ENCOUNTER — Ambulatory Visit (INDEPENDENT_AMBULATORY_CARE_PROVIDER_SITE_OTHER): Payer: 59 | Admitting: Neurology

## 2021-07-02 VITALS — BP 149/88 | HR 88 | Ht 69.0 in | Wt 273.6 lb

## 2021-07-02 DIAGNOSIS — R42 Dizziness and giddiness: Secondary | ICD-10-CM

## 2021-07-02 NOTE — Patient Instructions (Signed)
Good to meet you.  Schedule MRI brain without contrast  2. Schedule MRA head without contrast  3. Discuss BP and continue glucose control with Dr. Orland Mustard  4. Try the counterpressure maneuvers and increase hydration  5. Our office will call with MRI results, if normal, follow-up as needed.
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
## 2021-07-15 ENCOUNTER — Ambulatory Visit
Admission: RE | Admit: 2021-07-15 | Discharge: 2021-07-15 | Disposition: A | Discharge status: Home / Self Care | Payer: 59 | Source: Ambulatory Visit | Attending: Neurology | Admitting: Neurology

## 2021-07-15 DIAGNOSIS — R42 Dizziness and giddiness: Secondary | ICD-10-CM

## 2021-07-25 ENCOUNTER — Telehealth: Payer: Self-pay | Admitting: Neurology

## 2021-07-25 NOTE — Telephone Encounter (Signed)
Patient returned call to [facility] for results.

## 2021-08-17 NOTE — Progress Notes (Unsigned)
Cardiology Office Note:    Date:  08/17/2021   ID:  Michael Sandoval, DOB September 14, 1965, MRN [id number]  PCP:  London Pepper, MD   [place] Providers Cardiologist:  Lenna Sciara, MD Referring MD: London Pepper, MD   Chief Complaint/Reason for Referral: Chest pain  ASSESSMENT:    1. Precordial pain   2. Type 2 diabetes mellitus without complication, with long-term current use of insulin ([place])   3. Hypertension associated with diabetes ([place])   4. Hyperlipidemia associated with type 2 diabetes mellitus ([place])   5. BMI 40.0-44.9, adult ([place])     PLAN:    In order of problems listed above: 1.  Chest pain: 2.  Type 2 diabetes: Continue aspirin and lisinopril; will start atorvastatin 40 mg and Jardiance 10 mg. 3.  Hypertension: Continue lisinopril with goal blood pressure less than 130/80 mmHg. 4.  Hyperlipidemia: Given history of diabetes and coronary artery calcification goal LDL is less than 70.  We will check lipid panel and LFTs in 2 months along with LP(a). 5.  Elevated BMI: We will refer to pharmacy for further recommendations.         {Are you ordering a CV Procedure (e.g. stress test, cath, DCCV, TEE, etc)?   Press F2        :[id number]}   Dispo:  No follow-ups on file.      Medication Adjustments/Labs and Tests Ordered: Current medicines are reviewed at length with the patient today.  Concerns regarding medicines are outlined above.  The following changes have been made:  {PLAN; NO CHANGE:13088:s}   Labs/tests ordered: No orders of the defined types were placed in this encounter.   Medication Changes: No orders of the defined types were placed in this encounter.    Current medicines are reviewed at length with the patient today.  The patient {ACTIONS; HAS/DOES NOT HAVE:19233} concerns regarding medicines.   History of Present Illness:    FOCUSED PROBLEM LIST:   1.  LAD coronary calcium; CT 2017 2.  BMI of 40 3.  Hypertension 4.  Hyperlipidemia 5.   Type 2 diabetes on insulin  The patient is a 56 y.o. male with the indicated medical history here for recommendations regarding chest pain.  The patient was seen by his primary care provider recently.  He complains of some chest pain.  EKG was done which was unremarkable.  Due to history of elevated coronary calcium on a CT scan done in 2017 he was referred for further evaluation.    Current Medications: No outpatient medications have been marked as taking for the 08/20/21 encounter (Appointment) with Early Osmond, MD.     Allergies:    Onion and Other   Social History:   Social History   Tobacco Use   Smoking status: Never   Smokeless tobacco: Never  Vaping Use   Vaping Use: Never used  Substance Use Topics   Alcohol use: Yes    Comment: rare   Drug use: No     Family Hx: Family History  Problem Relation Age of Onset   Heart attack Mother 51   Hypertension Father      Review of Systems:   Please see the history of present illness.    All other systems reviewed and are negative.     EKGs/Labs/Other Test Reviewed:    EKG:  EKG performed 2023 that I personally reviewed demonstrates sinus rhythm with low voltage; EKG performed today that I personally reviewed demonstrates ***.  Prior CV  studies:  Calcium score 2017: LAD calcium was seen; 73rd percentile for age  ETT 2017 low risk  Other studies Reviewed: Review of the additional studies/records demonstrates: None relevant  Recent Labs: 01/24/2021: ALT 69; B Natriuretic Peptide 19.2; BUN 14; Creatinine, Ser 0.98; Hemoglobin 15.7; Magnesium 2.0; Platelets 280; Potassium 5.1; Sodium 131   Recent Lipid Panel No results found for: "CHOL", "TRIG", "HDL", "LDLCALC", "LDLDIRECT"  Risk Assessment/Calculations:    {Does this patient have ATRIAL FIBRILLATION?:[phone]}      Physical Exam:    VS:  There were no vitals taken for this visit.   Wt Readings from Last 3 Encounters:  07/02/21 273 lb 9.6 oz (124.1  kg)  01/24/21 256 lb (116.1 kg)  07/09/18 265 lb 3 oz (120.3 kg)    GENERAL:  No apparent distress, AOx3 HEENT:  No carotid bruits, +2 carotid impulses, no scleral icterus CAR: RRR Irregular RR*** no murmurs***, gallops, rubs, or thrills RES:  Clear to auscultation bilaterally ABD:  Soft, nontender, nondistended, positive bowel sounds x 4 VASC:  +2 radial pulses, +2 carotid pulses, palpable pedal pulses NEURO:  CN 2-12 grossly intact; motor and sensory grossly intact PSYCH:  No active depression or anxiety EXT:  No edema, ecchymosis, or cyanosis  Signed, Early Osmond, MD  08/17/2021 9:25 AM    [address], [place], [place]  [address] Phone: [phone]; Fax: [phone]   Note:  This document was prepared using Dragon voice recognition software and may include unintentional dictation errors.

## 2021-08-20 ENCOUNTER — Ambulatory Visit (INDEPENDENT_AMBULATORY_CARE_PROVIDER_SITE_OTHER): Payer: 59 | Admitting: Internal Medicine

## 2021-08-20 ENCOUNTER — Encounter: Payer: Self-pay | Admitting: Internal Medicine

## 2021-08-20 VITALS — BP 112/70 | HR 77 | Ht 69.0 in | Wt 271.0 lb

## 2021-08-20 DIAGNOSIS — E785 Hyperlipidemia, unspecified: Secondary | ICD-10-CM

## 2021-08-20 DIAGNOSIS — E1159 Type 2 diabetes mellitus with other circulatory complications: Secondary | ICD-10-CM

## 2021-08-20 DIAGNOSIS — Z6841 Body Mass Index (BMI) 40.0 and over, adult: Secondary | ICD-10-CM

## 2021-08-20 DIAGNOSIS — Z794 Long term (current) use of insulin: Secondary | ICD-10-CM

## 2021-08-20 DIAGNOSIS — E119 Type 2 diabetes mellitus without complications: Secondary | ICD-10-CM | POA: Diagnosis not present

## 2021-08-20 DIAGNOSIS — E1169 Type 2 diabetes mellitus with other specified complication: Secondary | ICD-10-CM

## 2021-08-20 DIAGNOSIS — R072 Precordial pain: Secondary | ICD-10-CM

## 2021-08-20 DIAGNOSIS — I152 Hypertension secondary to endocrine disorders: Secondary | ICD-10-CM

## 2021-08-20 MED ORDER — ISOSORBIDE MONONITRATE ER 30 MG PO TB24: 30.0000 mg | ORAL_TABLET | Freq: Every day | ORAL | 3 refills | Status: DC

## 2021-08-20 MED ORDER — NITROGLYCERIN 0.4 MG SL SUBL: 0.4000 mg | SUBLINGUAL_TABLET | SUBLINGUAL | 11 refills | Status: DC | PRN

## 2021-08-20 MED ORDER — METOPROLOL TARTRATE 100 MG PO TABS: 100.0000 mg | ORAL_TABLET | Freq: Once | ORAL | 0 refills | Status: DC

## 2021-08-20 MED ORDER — METOPROLOL SUCCINATE ER 25 MG PO TB24: 12.5000 mg | ORAL_TABLET | Freq: Every day | ORAL | 3 refills | Status: DC

## 2021-08-20 NOTE — Patient Instructions (Addendum)
Medication Instructions:  Your physician has recommended you make the following change in your medication:   1) START Imdur '30mg'$  at bedtime 2) START Toprol XL 12.'5mg'$  at bedtime 3) START Nitroglycerin 0.'4mg'$  as needed for chest pain (take this medication when you have chest pain, dissolve 1 tablet under tongue, you may take an additional tablet if no relief in 5 minutes, you may repeat this for 3 doses, if you are having to take a 3rd dose call 911) **You should be seated when taking this medication for at least 15 minutes, it may cause headache/dizziness.**  *If you need a refill on your cardiac medications before your next appointment, please call your pharmacy*   Lab Work: TODAY: Lipids, CMP, LP(a) If you have labs (blood work) drawn today and your tests are completely normal, you will receive your results only by: [place] (if you have MyChart) OR A paper copy in the mail If you have any lab test that is abnormal or we need to change your treatment, we will call you to review the results.  Testing/Procedures: Your physician has recommended you have a coronary CTA performed.  Your physician has requested that you have an echocardiogram. Echocardiography is a painless test that uses sound waves to create images of your heart. It provides your doctor with information about the size and shape of your heart and how well your heart's chambers and valves are working. This procedure takes approximately one hour. There are no restrictions for this procedure.  Follow-Up: At [facility], you and your health needs are our priority.  As part of our continuing mission to provide you with exceptional heart care, we have created designated Provider Care Teams.  These Care Teams include your primary Cardiologist (physician) and Advanced Practice Providers (APPs -  Physician Assistants and Nurse Practitioners) who all work together to provide you with the care you need, when you need it.  Your  next appointment:   6 month(s)  The format for your next appointment:   In Person  Provider:   Early Osmond, MD {  Other Instructions   Your cardiac CT will be scheduled at the below location:   [facility] [place], [place] [address] [phone]  Please arrive at the [facility] (Entrance C2) of [facility] 30 minutes prior to test start time. You can use the FREE valet parking offered at entrance C (encouraged to control the heart rate for the test)  Proceed to the [facility] Radiology Department (first floor) to check-in and test prep.  All radiology patients and guests should use entrance C2 at [facility], accessed from [facility], even though the hospital's physical address listed is [address].    Please follow these instructions carefully (unless otherwise directed):  Hold all erectile dysfunction medications at least 3 days (72 hrs) prior to test.  On the Night Before the Test: Be sure to Drink plenty of water. Do not consume any caffeinated/decaffeinated beverages or chocolate 12 hours prior to your test. Do not take any antihistamines 12 hours prior to your test.  On the Day of the Test: Drink plenty of water until 1 hour prior to the test. Do not eat any food 4 hours prior to the test. You may take your regular medications prior to the test.  Take metoprolol (Lopressor) '100mg'$  two hours prior to test.  After the Test: Drink plenty of water. After receiving IV contrast,  you may experience a mild flushed feeling. This is normal. On occasion, you may experience a mild rash up to 24 hours after the test. This is not dangerous. If this occurs, you can take Benadryl 25 mg and increase your fluid intake. If you experience trouble breathing, this can be serious. If it is severe call 911 IMMEDIATELY. If it is mild, please call our office. If you take any of these medications:  Glipizide/Metformin, Avandament, Glucavance, please do not take 48 hours after completing test unless otherwise instructed.  We will call to schedule your test 2-4 weeks out understanding that some insurance companies will need an authorization prior to the service being performed.   For non-scheduling related questions, please contact the cardiac imaging nurse navigator should you have any questions/concerns: Marchia Bond, Cardiac Imaging Nurse Navigator Gordy Clement, Cardiac Imaging Nurse Navigator [facility] Direct Office Dial: [phone]   For scheduling needs, including cancellations and rescheduling, please call [place], [phone].   Important Information About Sugar

## 2021-08-21 LAB — COMPREHENSIVE METABOLIC PANEL
ALT: 38 IU/L (ref 0–44)
AST: 28 IU/L (ref 0–40)
Albumin/Globulin Ratio: 2.1 (ref 1.2–2.2)
Albumin: 4.2 g/dL (ref 3.8–4.9)
Alkaline Phosphatase: 89 IU/L (ref 44–121)
BUN/Creatinine Ratio: 23 — ABNORMAL HIGH (ref 9–20)
BUN: 22 mg/dL (ref 6–24)
Bilirubin Total: 0.4 mg/dL (ref 0.0–1.2)
CO2: 24 mmol/L (ref 20–29)
Calcium: 9.9 mg/dL (ref 8.7–10.2)
Chloride: 99 mmol/L (ref 96–106)
Creatinine, Ser: 0.96 mg/dL (ref 0.76–1.27)
Globulin, Total: 2 g/dL (ref 1.5–4.5)
Glucose: 251 mg/dL — ABNORMAL HIGH (ref 70–99)
Potassium: 5.4 mmol/L — ABNORMAL HIGH (ref 3.5–5.2)
Sodium: 136 mmol/L (ref 134–144)
Total Protein: 6.2 g/dL (ref 6.0–8.5)
eGFR: 92 mL/min/{1.73_m2} (ref >59)

## 2021-08-21 LAB — LIPOPROTEIN A (LPA): Lipoprotein (a): <8.4 nmol/L (ref <75.0)

## 2021-08-21 LAB — LIPID PANEL
Chol/HDL Ratio: 4.8 ratio (ref 0.0–5.0)
Cholesterol, Total: 231 mg/dL — ABNORMAL HIGH (ref 100–199)
HDL: 48 mg/dL (ref >39)
LDL Chol Calc (NIH): 141 mg/dL — ABNORMAL HIGH (ref 0–99)
Triglycerides: 232 mg/dL — ABNORMAL HIGH (ref 0–149)
VLDL Cholesterol Cal: 42 mg/dL — ABNORMAL HIGH (ref 5–40)

## 2021-09-04 DIAGNOSIS — I10 Essential (primary) hypertension: Secondary | ICD-10-CM | POA: Insufficient documentation

## 2021-09-04 DIAGNOSIS — I214 Non-ST elevation (NSTEMI) myocardial infarction: Secondary | ICD-10-CM | POA: Insufficient documentation

## 2021-09-05 ENCOUNTER — Telehealth: Payer: Self-pay | Admitting: Internal Medicine

## 2021-09-05 NOTE — Telephone Encounter (Signed)
Patient states he is currently at [facility]. Due to blockages on CT this morning they are recommending patient have bypass surgery and patient states they are able to schedule him for 7/28.They are not requesting cardiac clearance, but patient would like to discuss whether Dr. Ali Lowe agrees with this. Please advise.

## 2021-09-05 NOTE — Telephone Encounter (Signed)
Called and spoke to male who answered phone and introduced herself as Greg's wife. Informed her that Ali Lowe is out of office this week and won't be back until Monday as rounder in the hospital. Wife states that patient is at [facility] and is adamant that he doesn't want their cardiac team to do the surgery they are recommending (CABG). He is attempting to find a polite way to get [place] involved so that the surgery can be done at [facility]. Advised wife that they can request a transfer to [facility]. Other options would be for him to decline the surgery, then go to [facility] directly. Wife appreciates call, states they will look into transfer.

## 2021-09-06 DIAGNOSIS — E1169 Type 2 diabetes mellitus with other specified complication: Secondary | ICD-10-CM | POA: Insufficient documentation

## 2021-09-06 DIAGNOSIS — J349 Unspecified disorder of nose and nasal sinuses: Secondary | ICD-10-CM | POA: Insufficient documentation

## 2021-09-06 DIAGNOSIS — F418 Other specified anxiety disorders: Secondary | ICD-10-CM | POA: Insufficient documentation

## 2021-09-06 DIAGNOSIS — G479 Sleep disorder, unspecified: Secondary | ICD-10-CM | POA: Insufficient documentation

## 2021-09-06 DIAGNOSIS — R400 Somnolence: Secondary | ICD-10-CM | POA: Insufficient documentation

## 2021-09-06 DIAGNOSIS — J329 Chronic sinusitis, unspecified: Secondary | ICD-10-CM | POA: Insufficient documentation

## 2021-09-06 DIAGNOSIS — R030 Elevated blood-pressure reading, without diagnosis of hypertension: Secondary | ICD-10-CM | POA: Insufficient documentation

## 2021-09-06 DIAGNOSIS — K219 Gastro-esophageal reflux disease without esophagitis: Secondary | ICD-10-CM | POA: Insufficient documentation

## 2021-09-06 DIAGNOSIS — E785 Hyperlipidemia, unspecified: Secondary | ICD-10-CM | POA: Insufficient documentation

## 2021-09-10 ENCOUNTER — Other Ambulatory Visit: Payer: Self-pay

## 2021-09-10 ENCOUNTER — Emergency Department (HOSPITAL_COMMUNITY): Payer: 59

## 2021-09-10 ENCOUNTER — Telehealth: Payer: Self-pay | Admitting: Physician Assistant

## 2021-09-10 ENCOUNTER — Emergency Department (HOSPITAL_COMMUNITY)
Admission: EM | Admit: 2021-09-10 | Discharge: 2021-09-10 | Discharge status: Against Medical Advice | Payer: 59 | Source: Home / Self Care

## 2021-09-10 ENCOUNTER — Institutional Professional Consult (permissible substitution) (INDEPENDENT_AMBULATORY_CARE_PROVIDER_SITE_OTHER): Payer: 59 | Admitting: Cardiothoracic Surgery

## 2021-09-10 ENCOUNTER — Encounter (HOSPITAL_COMMUNITY): Payer: Self-pay

## 2021-09-10 ENCOUNTER — Encounter: Payer: Self-pay | Admitting: Cardiothoracic Surgery

## 2021-09-10 ENCOUNTER — Inpatient Hospital Stay (HOSPITAL_COMMUNITY)
Admission: EM | Admit: 2021-09-10 | Discharge: 2021-09-12 | DRG: 247 | Disposition: A | Discharge status: Home / Self Care | Payer: 59 | Attending: Internal Medicine | Admitting: Internal Medicine

## 2021-09-10 VITALS — BP 139/82 | HR 83 | Resp 20 | Ht 69.0 in | Wt 270.0 lb

## 2021-09-10 DIAGNOSIS — I222 Subsequent non-ST elevation (NSTEMI) myocardial infarction: Principal | ICD-10-CM | POA: Diagnosis present

## 2021-09-10 DIAGNOSIS — R079 Chest pain, unspecified: Secondary | ICD-10-CM | POA: Insufficient documentation

## 2021-09-10 DIAGNOSIS — F32A Depression, unspecified: Secondary | ICD-10-CM | POA: Diagnosis present

## 2021-09-10 DIAGNOSIS — I249 Acute ischemic heart disease, unspecified: Principal | ICD-10-CM

## 2021-09-10 DIAGNOSIS — Z91018 Allergy to other foods: Secondary | ICD-10-CM

## 2021-09-10 DIAGNOSIS — K219 Gastro-esophageal reflux disease without esophagitis: Secondary | ICD-10-CM | POA: Diagnosis present

## 2021-09-10 DIAGNOSIS — Z951 Presence of aortocoronary bypass graft: Secondary | ICD-10-CM | POA: Insufficient documentation

## 2021-09-10 DIAGNOSIS — Z955 Presence of coronary angioplasty implant and graft: Secondary | ICD-10-CM

## 2021-09-10 DIAGNOSIS — I214 Non-ST elevation (NSTEMI) myocardial infarction: Secondary | ICD-10-CM | POA: Diagnosis not present

## 2021-09-10 DIAGNOSIS — F419 Anxiety disorder, unspecified: Secondary | ICD-10-CM | POA: Diagnosis present

## 2021-09-10 DIAGNOSIS — I2510 Atherosclerotic heart disease of native coronary artery without angina pectoris: Secondary | ICD-10-CM | POA: Insufficient documentation

## 2021-09-10 DIAGNOSIS — Z96641 Presence of right artificial hip joint: Secondary | ICD-10-CM | POA: Diagnosis present

## 2021-09-10 DIAGNOSIS — I219 Acute myocardial infarction, unspecified: Secondary | ICD-10-CM | POA: Diagnosis present

## 2021-09-10 DIAGNOSIS — Z5321 Procedure and treatment not carried out due to patient leaving prior to being seen by health care provider: Secondary | ICD-10-CM | POA: Insufficient documentation

## 2021-09-10 DIAGNOSIS — Z8679 Personal history of other diseases of the circulatory system: Secondary | ICD-10-CM

## 2021-09-10 DIAGNOSIS — Z888 Allergy status to other drugs, medicaments and biological substances status: Secondary | ICD-10-CM

## 2021-09-10 DIAGNOSIS — E1169 Type 2 diabetes mellitus with other specified complication: Secondary | ICD-10-CM | POA: Diagnosis present

## 2021-09-10 DIAGNOSIS — Z7982 Long term (current) use of aspirin: Secondary | ICD-10-CM

## 2021-09-10 DIAGNOSIS — E669 Obesity, unspecified: Secondary | ICD-10-CM | POA: Diagnosis present

## 2021-09-10 DIAGNOSIS — Z8701 Personal history of pneumonia (recurrent): Secondary | ICD-10-CM

## 2021-09-10 DIAGNOSIS — E785 Hyperlipidemia, unspecified: Secondary | ICD-10-CM | POA: Diagnosis present

## 2021-09-10 DIAGNOSIS — M1990 Unspecified osteoarthritis, unspecified site: Secondary | ICD-10-CM | POA: Diagnosis present

## 2021-09-10 DIAGNOSIS — Z8249 Family history of ischemic heart disease and other diseases of the circulatory system: Secondary | ICD-10-CM

## 2021-09-10 DIAGNOSIS — Z79899 Other long term (current) drug therapy: Secondary | ICD-10-CM

## 2021-09-10 DIAGNOSIS — Z794 Long term (current) use of insulin: Secondary | ICD-10-CM

## 2021-09-10 DIAGNOSIS — I10 Essential (primary) hypertension: Secondary | ICD-10-CM | POA: Diagnosis present

## 2021-09-10 DIAGNOSIS — Z85828 Personal history of other malignant neoplasm of skin: Secondary | ICD-10-CM

## 2021-09-10 DIAGNOSIS — Z7984 Long term (current) use of oral hypoglycemic drugs: Secondary | ICD-10-CM

## 2021-09-10 DIAGNOSIS — Z85820 Personal history of malignant melanoma of skin: Secondary | ICD-10-CM

## 2021-09-10 DIAGNOSIS — Z6839 Body mass index (BMI) 39.0-39.9, adult: Secondary | ICD-10-CM

## 2021-09-10 LAB — CBC
HCT: 46.2 % (ref 39.0–52.0)
Hemoglobin: 15.2 g/dL (ref 13.0–17.0)
MCH: 30.2 pg (ref 26.0–34.0)
MCHC: 32.9 g/dL (ref 30.0–36.0)
MCV: 91.7 fL (ref 80.0–100.0)
Platelets: 290 10*3/uL (ref 150–400)
RBC: 5.04 MIL/uL (ref 4.22–5.81)
RDW: 13.5 % (ref 11.5–15.5)
WBC: 14.2 10*3/uL — ABNORMAL HIGH (ref 4.0–10.5)
nRBC: 0 % (ref 0.0–0.2)

## 2021-09-10 LAB — BASIC METABOLIC PANEL
Anion gap: 10 (ref 5–15)
BUN: 17 mg/dL (ref 6–20)
CO2: 26 mmol/L (ref 22–32)
Calcium: 9.6 mg/dL (ref 8.9–10.3)
Chloride: 102 mmol/L (ref 98–111)
Creatinine, Ser: 1.08 mg/dL (ref 0.61–1.24)
GFR, Estimated: >60 mL/min (ref >60)
Glucose, Bld: 159 mg/dL — ABNORMAL HIGH (ref 70–99)
Potassium: 4.5 mmol/L (ref 3.5–5.1)
Sodium: 138 mmol/L (ref 135–145)

## 2021-09-10 LAB — PROTIME-INR
INR: 1 (ref 0.8–1.2)
Prothrombin Time: 12.8 seconds (ref 11.4–15.2)

## 2021-09-10 LAB — TROPONIN I (HIGH SENSITIVITY)
Troponin I (High Sensitivity): 25 ng/L — ABNORMAL HIGH (ref <18)
Troponin I (High Sensitivity): 47 ng/L — ABNORMAL HIGH (ref <18)
Troponin I (High Sensitivity): 43 ng/L — ABNORMAL HIGH (ref <18)

## 2021-09-10 MED ORDER — ASPIRIN 81 MG PO TBEC
243.0000 mg | DELAYED_RELEASE_TABLET | Freq: Once | ORAL | Status: AC
Start: 2021-09-10 — End: 2021-09-10
  Administered 2021-09-10: 243 mg via ORAL
  Filled 2021-09-10: qty 3

## 2021-09-10 NOTE — ED Provider Triage Note (Signed)
Emergency Medicine Provider Triage Evaluation Note  Michael Sandoval , a 56 y.o. male  was evaluated in triage.  Pt complains of chest pain.  Patient presented to emergency room earlier in the left because he was symptom-free.  His troponin was uptrending.  He states after leaving he went to bed and around 9 PM his symptoms returned.  He took 5 nitroglycerin prior to returning.  He reports symptoms are mild and rates his chest pain 2/10.  Review of Systems  Positive: As above Negative: As above  Physical Exam  There were no vitals taken for this visit. Gen:   Awake, no distress   Resp:  Normal effort MSK:   Moves extremities without difficulty  Other:    Medical Decision Making  Medically screening exam initiated at 9:55 PM.  Appropriate orders placed.  CHEN SAADEH was informed that the remainder of the evaluation will be completed by another provider, this initial triage assessment does not replace that evaluation, and the importance of remaining in the ED until their evaluation is complete.     [name], PA-C 09/10/21 2159

## 2021-09-10 NOTE — ED Triage Notes (Addendum)
Pt c/o recurring CP, came earlier but LWBS "after symptoms resolved." States he went to lay down tonight, pain returned, states he wants to stay to have full evaluation. Pt advises NTG PTA

## 2021-09-10 NOTE — ED Triage Notes (Signed)
Patient complains of recurrent chest pain that started today at 1pm-just left CVTS office and pain started. Patient states that pain has now lessened and feeling better

## 2021-09-10 NOTE — ED Notes (Signed)
Patient decided to leave.   

## 2021-09-10 NOTE — ED Provider Triage Note (Signed)
Emergency Medicine Provider Triage Evaluation Note  ABHIRAM CRIADO , a 56 y.o. male  was evaluated in triage.  Pt complains of chest pain.  Currently resolved after taking 10 nitroglycerin prior to arrival.  Currently otherwise asymptomatic as well.  Patient has known CAD.  Lived to be multivessel disease and there were plans for CABG. patient evaluated at surgeons office today and mentioned instead of CABG planned for PCI.  Patient was recently admitted to [facility].  Reports since discharge she has remained chest pain-free until this episode after leaving his cardiologist office.  States he did call the cardiologist office to let them know he is in the emergency room.  Review of Systems  Positive: As above Negative: As above  Physical Exam  BP 116/62 (BP Location: Right Arm)   Pulse 79   Temp 98.3 F (36.8 C) (Oral)   Resp 16   SpO2 100%  Gen:   Awake, no distress   Resp:  Normal effort  MSK:   Moves extremities without difficulty  Other:    Medical Decision Making  Medically screening exam initiated at 2:38 PM.  Appropriate orders placed.  CARLIN ATTRIDGE was informed that the remainder of the evaluation will be completed by another provider, this initial triage assessment does not replace that evaluation, and the importance of remaining in the ED until their evaluation is complete.     [name], PA-C 09/10/21 1439

## 2021-09-10 NOTE — ED Notes (Signed)
Attempted to call patient to inform of abnormal lab and suggest return to ED but no answer

## 2021-09-10 NOTE — Telephone Encounter (Signed)
New Message:     Patient called, he wanted Nicholes Rough to know that he is in [place] at this time with Chest Pains.

## 2021-09-10 NOTE — Progress Notes (Signed)
PCP is London Pepper, MD Referring Provider is Early Osmond, MD  Chief Complaint  Patient presents with   Coronary Artery Disease    New patient consultation CATH/ECHO 7/26  Patient examined, images of echocardiogram and recent coronary angiogram at [facility] reviewed and discussed with patient  HPI: 56 year old obese hypertensive diabetic non-smoker presents for discussion of recently diagnosed coronary artery disease.  He has been seen in the cardiology clinic by Dr. Ali Lowe for stable angina and has been on medical therapy.  Last week he had severe chest pain while driving and presented to the ED at [facility].  His high sensitive troponin level was mildly elevated and a cardiac cath demonstrated significant 85 to 90% stenosis of the proximal-mid LAD and 85% stenosis of a small circumflex marginal.  He was discharged for follow-up at [facility] heart center and presented today.  He has had no significant chest pain since his cardiac cath.  He was given nitroglycerin to take.  He has held off on working in any other exertional activities.  I reviewed the coronary angiogram with the patient.  His LAD is the culprit vessel with a somewhat long 85 to 90% stenosis.  It does not appear to involve a diagonal branch.  The circumflex vessel with significant stenosis is very small and nongraftable.  The right coronary is normal.  EF is normal by cath.  With single-vessel surgical disease I will ask Dr. Ali Lowe to review the angiograms to see if patient is a candidate for PCI.  Patient will wait to hear the results of the referral to Dr. Ali Lowe.   Past Medical History:  Diagnosis Date   Anxiety    Arthritis    Cancer ([place])    melanoma skin cancer    Daytime somnolence    Decreased hearing    Depression    Diabetes mellitus, type II ([place])        Dyslipidemia    Elevated blood pressure reading    Elevated coronary artery calcium score    Excessive daytime sleepiness    GERD  (gastroesophageal reflux disease)    HTN (hypertension)    HTN (hypertension)    Hyperlipidemia    Lightheadedness    Non-restorative sleep    Obesity    Osteoarthritis of hip    Pneumonia    1990    Sinusitis    Unspecified disorder of nose and nasal sinuses     Past Surgical History:  Procedure Laterality Date   colonoscopy   2006   HAND SURGERY     crush injury , small screw in place per surgeon    skin cancer   several years ago   [place] Right 07/09/2018   Procedure: TOTAL HIP ARTHROPLASTY ANTERIOR APPROACH;  Surgeon: Rod Can, MD;  Location: [facility] ORS;  Service: Orthopedics;  Laterality: Right;    Family History  Problem Relation Age of Onset   Heart attack Mother 54   Hypertension Father     Social History Social History   Tobacco Use   Smoking status: Never   Smokeless tobacco: Never  Vaping Use   Vaping Use: Never used  Substance Use Topics   Alcohol use: Yes    Comment: rare   Drug use: No    Current Outpatient Medications  Medication Sig Dispense Refill   aspirin 81 MG chewable tablet Chew 1 tablet (81 mg total) by mouth 2 (two) times daily. 60 tablet 1  cetirizine (ZYRTEC) 10 MG tablet Take 10 mg by mouth as needed for allergies.     fluticasone (FLONASE) 50 MCG/ACT nasal spray Place 1 spray into both nostrils daily.     glucose blood test strip 1 each by Other route as needed for other. Use as instructed One Touch Verio (Blood Glucose Test) One Touch Delica Lancets 09B     ibuprofen (ADVIL,MOTRIN) 200 MG tablet Take 400 mg by mouth every 6 (six) hours as needed (PAIN).      isosorbide mononitrate (IMDUR) 30 MG 24 hr tablet Take 1 tablet (30 mg total) by mouth daily. 90 tablet 3   lisinopril (PRINIVIL,ZESTRIL) 10 MG tablet Take 10 mg by mouth daily.     loratadine (CLARITIN) 10 MG tablet Take 10 mg by mouth daily as needed for allergies.     meclizine (ANTIVERT) 25 MG tablet Take 1 tablet by  mouth as needed.     metoprolol succinate (TOPROL XL) 25 MG 24 hr tablet Take 0.5 tablets (12.5 mg total) by mouth daily. 45 tablet 3   Multiple Vitamins-Minerals (MULTIVITAMIN WITH MINERALS) tablet Take 1 tablet by mouth daily.     nitroGLYCERIN (NITROSTAT) 0.4 MG SL tablet Place 1 tablet (0.4 mg total) under the tongue every 5 (five) minutes as needed for chest pain. 25 tablet 11   NOVOLIN 70/30 KWIKPEN (70-30) 100 UNIT/ML KwikPen Inject 20 Units into the skin 2 (two) times daily.     omeprazole (PRILOSEC OTC) 20 MG tablet Take 1 tablet by mouth as needed.     venlafaxine XR (EFFEXOR-XR) 75 MG 24 hr capsule Take 75 mg by mouth daily.     XIGDUO XR 06-998 MG TB24 Take 1 tablet by mouth 2 (two) times daily.     No current facility-administered medications for this visit.    Allergies  Allergen Reactions   Onion Other (See Comments)   Capsicum Diarrhea and Nausea Only    Review of Systems:  Weight stable Right-hand-dominant Lightheadedness and presyncope evaluated by neurology and he was told the brain vasculature was normal by MRI MRI. No history of venous stasis, venous disease No history of thoracic trauma Tolerated right hip replacement without anesthesia problems or bleeding  BP 139/82 (BP Location: Right Arm, Patient Position: Sitting, Cuff Size: Large)   Pulse 83   Resp 20   Ht '5\' 9"'$  (1.753 m)   Wt 270 lb (122.5 kg)   SpO2 98% Comment: RA  BMI 39.87 kg/m  Physical Exam:     Physical Exam  General: Obese middle-aged male no acute distress HEENT: Normocephalic pupils equal , dentition adequate Neck: Supple without JVD, adenopathy, or bruit Chest: Clear to auscultation, symmetrical breath sounds, no rhonchi, no tenderness             or deformity Cardiovascular: Regular rate and rhythm, no murmur, no gallop, peripheral pulses             palpable in all extremities Abdomen:  Soft, nontender, no palpable mass or organomegaly Extremities: Warm, well-perfused, no  clubbing cyanosis edema or tenderness,              no venous stasis changes of the legs Rectal/GU: Deferred Neuro: Grossly non--focal and symmetrical throughout Skin: Clean and dry without rash or ulceration   Diagnostic Tests: I reviewed the images of the coronary angiogram and echocardiogram Results of coronary angiogram discussed with patient  Impression: Class 3-4 angina Recent non-ST elevation MI Coronary artery disease by cath Diabetes type 2  Obesity Hypertension  Plan:  We will have the coronary angiogram reviewed by cardiology for possible PCI.  If not a good candidate then surgery will be scheduled later this week or next week.  Plan discussed with patient and he understands and agrees   Dahlia Byes, MD [facility] [phone]

## 2021-09-11 ENCOUNTER — Telehealth (HOSPITAL_COMMUNITY): Payer: Self-pay

## 2021-09-11 ENCOUNTER — Other Ambulatory Visit: Payer: 59

## 2021-09-11 ENCOUNTER — Ambulatory Visit: Payer: 59

## 2021-09-11 ENCOUNTER — Other Ambulatory Visit (HOSPITAL_COMMUNITY): Payer: Self-pay

## 2021-09-11 ENCOUNTER — Encounter (HOSPITAL_COMMUNITY): Payer: Self-pay | Admitting: Internal Medicine

## 2021-09-11 ENCOUNTER — Other Ambulatory Visit (HOSPITAL_COMMUNITY): Payer: 59

## 2021-09-11 ENCOUNTER — Encounter (HOSPITAL_COMMUNITY)
Admission: EM | Disposition: A | Discharge status: Home / Self Care | Payer: Self-pay | Source: Home / Self Care | Attending: Internal Medicine

## 2021-09-11 DIAGNOSIS — Z79899 Other long term (current) drug therapy: Secondary | ICD-10-CM | POA: Diagnosis not present

## 2021-09-11 DIAGNOSIS — I2510 Atherosclerotic heart disease of native coronary artery without angina pectoris: Secondary | ICD-10-CM | POA: Diagnosis present

## 2021-09-11 DIAGNOSIS — K219 Gastro-esophageal reflux disease without esophagitis: Secondary | ICD-10-CM | POA: Diagnosis present

## 2021-09-11 DIAGNOSIS — I10 Essential (primary) hypertension: Secondary | ICD-10-CM | POA: Diagnosis present

## 2021-09-11 DIAGNOSIS — E785 Hyperlipidemia, unspecified: Secondary | ICD-10-CM | POA: Diagnosis present

## 2021-09-11 DIAGNOSIS — Z8701 Personal history of pneumonia (recurrent): Secondary | ICD-10-CM | POA: Diagnosis not present

## 2021-09-11 DIAGNOSIS — Z794 Long term (current) use of insulin: Secondary | ICD-10-CM | POA: Diagnosis not present

## 2021-09-11 DIAGNOSIS — Z91018 Allergy to other foods: Secondary | ICD-10-CM | POA: Diagnosis not present

## 2021-09-11 DIAGNOSIS — Z96641 Presence of right artificial hip joint: Secondary | ICD-10-CM | POA: Diagnosis present

## 2021-09-11 DIAGNOSIS — R079 Chest pain, unspecified: Secondary | ICD-10-CM | POA: Diagnosis present

## 2021-09-11 DIAGNOSIS — M1990 Unspecified osteoarthritis, unspecified site: Secondary | ICD-10-CM | POA: Diagnosis present

## 2021-09-11 DIAGNOSIS — Z7982 Long term (current) use of aspirin: Secondary | ICD-10-CM | POA: Diagnosis not present

## 2021-09-11 DIAGNOSIS — F32A Depression, unspecified: Secondary | ICD-10-CM | POA: Diagnosis present

## 2021-09-11 DIAGNOSIS — Z85828 Personal history of other malignant neoplasm of skin: Secondary | ICD-10-CM | POA: Diagnosis not present

## 2021-09-11 DIAGNOSIS — I219 Acute myocardial infarction, unspecified: Secondary | ICD-10-CM | POA: Diagnosis present

## 2021-09-11 DIAGNOSIS — I222 Subsequent non-ST elevation (NSTEMI) myocardial infarction: Secondary | ICD-10-CM | POA: Diagnosis present

## 2021-09-11 DIAGNOSIS — Z888 Allergy status to other drugs, medicaments and biological substances status: Secondary | ICD-10-CM | POA: Diagnosis not present

## 2021-09-11 DIAGNOSIS — Z7984 Long term (current) use of oral hypoglycemic drugs: Secondary | ICD-10-CM | POA: Diagnosis not present

## 2021-09-11 DIAGNOSIS — I214 Non-ST elevation (NSTEMI) myocardial infarction: Secondary | ICD-10-CM

## 2021-09-11 DIAGNOSIS — Z85820 Personal history of malignant melanoma of skin: Secondary | ICD-10-CM | POA: Diagnosis not present

## 2021-09-11 DIAGNOSIS — E669 Obesity, unspecified: Secondary | ICD-10-CM | POA: Diagnosis present

## 2021-09-11 DIAGNOSIS — Z8249 Family history of ischemic heart disease and other diseases of the circulatory system: Secondary | ICD-10-CM | POA: Diagnosis not present

## 2021-09-11 DIAGNOSIS — E1169 Type 2 diabetes mellitus with other specified complication: Secondary | ICD-10-CM | POA: Diagnosis present

## 2021-09-11 DIAGNOSIS — F419 Anxiety disorder, unspecified: Secondary | ICD-10-CM | POA: Diagnosis present

## 2021-09-11 DIAGNOSIS — Z6839 Body mass index (BMI) 39.0-39.9, adult: Secondary | ICD-10-CM | POA: Diagnosis not present

## 2021-09-11 HISTORY — PX: INTRAVASCULAR ULTRASOUND/IVUS: CATH118244

## 2021-09-11 HISTORY — PX: CORONARY STENT INTERVENTION: CATH118234

## 2021-09-11 LAB — URINALYSIS, ROUTINE W REFLEX MICROSCOPIC
Bacteria, UA: NONE SEEN
Bilirubin Urine: NEGATIVE
Glucose, UA: >=500 mg/dL — AB
Hgb urine dipstick: NEGATIVE
Ketones, ur: 20 mg/dL — AB
Leukocytes,Ua: NEGATIVE
Nitrite: NEGATIVE
Protein, ur: NEGATIVE mg/dL
Specific Gravity, Urine: 1.029 (ref 1.005–1.030)
pH: 6 (ref 5.0–8.0)

## 2021-09-11 LAB — CBC
HCT: 42.2 % (ref 39.0–52.0)
Hemoglobin: 13.7 g/dL (ref 13.0–17.0)
MCH: 29.7 pg (ref 26.0–34.0)
MCHC: 32.5 g/dL (ref 30.0–36.0)
MCV: 91.3 fL (ref 80.0–100.0)
Platelets: 257 10*3/uL (ref 150–400)
RBC: 4.62 MIL/uL (ref 4.22–5.81)
RDW: 13.5 % (ref 11.5–15.5)
WBC: 13.8 10*3/uL — ABNORMAL HIGH (ref 4.0–10.5)
nRBC: 0 % (ref 0.0–0.2)

## 2021-09-11 LAB — GLUCOSE, CAPILLARY
Glucose-Capillary: 139 mg/dL — ABNORMAL HIGH (ref 70–99)
Glucose-Capillary: 275 mg/dL — ABNORMAL HIGH (ref 70–99)
Glucose-Capillary: 215 mg/dL — ABNORMAL HIGH (ref 70–99)
Glucose-Capillary: 196 mg/dL — ABNORMAL HIGH (ref 70–99)

## 2021-09-11 LAB — HEPARIN LEVEL (UNFRACTIONATED): Heparin Unfractionated: 0.34 IU/mL (ref 0.30–0.70)

## 2021-09-11 LAB — HIV ANTIBODY (ROUTINE TESTING W REFLEX): HIV Screen 4th Generation wRfx: NONREACTIVE

## 2021-09-11 LAB — CBG MONITORING, ED: Glucose-Capillary: 142 mg/dL — ABNORMAL HIGH (ref 70–99)

## 2021-09-11 LAB — POCT ACTIVATED CLOTTING TIME
Activated Clotting Time: 498 seconds
Activated Clotting Time: 690 seconds
Activated Clotting Time: 407 seconds

## 2021-09-11 LAB — TROPONIN I (HIGH SENSITIVITY): Troponin I (High Sensitivity): 78 ng/L — ABNORMAL HIGH (ref <18)

## 2021-09-11 SURGERY — INTRAVASCULAR ULTRASOUND/IVUS
Anesthesia: LOCAL

## 2021-09-11 MED ORDER — SODIUM CHLORIDE 0.9 % IV SOLN: INTRAVENOUS | Status: AC

## 2021-09-11 MED ORDER — ASPIRIN 81 MG PO TBEC
81.0000 mg | DELAYED_RELEASE_TABLET | Freq: Every day | ORAL | Status: DC
  Administered 2021-09-11 – 2021-09-12 (×2): 81 mg via ORAL
  Filled 2021-09-11 (×2): qty 1

## 2021-09-11 MED ORDER — ISOSORBIDE MONONITRATE ER 30 MG PO TB24
30.0000 mg | ORAL_TABLET | Freq: Every day | ORAL | Status: DC
  Administered 2021-09-11 – 2021-09-12 (×2): 30 mg via ORAL
  Filled 2021-09-11 (×2): qty 1

## 2021-09-11 MED ORDER — ACETAMINOPHEN 325 MG PO TABS: 650.0000 mg | ORAL_TABLET | ORAL | Status: DC | PRN

## 2021-09-11 MED ORDER — MIDAZOLAM HCL 2 MG/2ML IJ SOLN
INTRAMUSCULAR | Status: AC
  Filled 2021-09-11: qty 2

## 2021-09-11 MED ORDER — ONDANSETRON HCL 4 MG/2ML IJ SOLN: 4.0000 mg | Freq: Four times a day (QID) | INTRAMUSCULAR | Status: DC | PRN

## 2021-09-11 MED ORDER — NITROGLYCERIN IN D5W 200-5 MCG/ML-% IV SOLN
INTRAVENOUS | Status: DC | PRN
  Administered 2021-09-11: 10 ug/min via INTRAVENOUS

## 2021-09-11 MED ORDER — ASPIRIN 81 MG PO CHEW: 81.0000 mg | CHEWABLE_TABLET | Freq: Every day | ORAL | Status: DC

## 2021-09-11 MED ORDER — FENTANYL CITRATE (PF) 100 MCG/2ML IJ SOLN
INTRAMUSCULAR | Status: AC
  Filled 2021-09-11: qty 2

## 2021-09-11 MED ORDER — SODIUM CHLORIDE 0.9 % IV SOLN: Freq: Once | INTRAVENOUS | Status: AC

## 2021-09-11 MED ORDER — MECLIZINE HCL 25 MG PO TABS: 25.0000 mg | ORAL_TABLET | Freq: Two times a day (BID) | ORAL | Status: DC | PRN

## 2021-09-11 MED ORDER — LISINOPRIL 10 MG PO TABS
10.0000 mg | ORAL_TABLET | Freq: Every day | ORAL | Status: DC
  Administered 2021-09-11 – 2021-09-12 (×2): 10 mg via ORAL
  Filled 2021-09-11 (×2): qty 1

## 2021-09-11 MED ORDER — NITROGLYCERIN 1 MG/10 ML FOR IR/CATH LAB
INTRA_ARTERIAL | Status: DC | PRN
  Administered 2021-09-11 (×4): 200 ug via INTRACORONARY

## 2021-09-11 MED ORDER — SODIUM CHLORIDE 0.9 % WEIGHT BASED INFUSION
3.0000 mL/kg/h | INTRAVENOUS | Status: DC
Start: 2021-09-11 — End: 2021-09-11
  Administered 2021-09-11: 3 mL/kg/h via INTRAVENOUS

## 2021-09-11 MED ORDER — HEPARIN SODIUM (PORCINE) 1000 UNIT/ML IJ SOLN
INTRAMUSCULAR | Status: DC | PRN
  Administered 2021-09-11: 12000 [IU] via INTRAVENOUS

## 2021-09-11 MED ORDER — SODIUM CHLORIDE 0.9 % WEIGHT BASED INFUSION
1.0000 mL/kg/h | INTRAVENOUS | Status: DC
Start: 2021-09-11 — End: 2021-09-11
  Administered 2021-09-11: 1 mL/kg/h via INTRAVENOUS

## 2021-09-11 MED ORDER — MECLIZINE HCL 25 MG PO TABS: 25.0000 mg | ORAL_TABLET | ORAL | Status: DC | PRN

## 2021-09-11 MED ORDER — SODIUM CHLORIDE 0.9% FLUSH: 3.0000 mL | INTRAVENOUS | Status: DC | PRN

## 2021-09-11 MED ORDER — SODIUM CHLORIDE 0.9% FLUSH
3.0000 mL | Freq: Two times a day (BID) | INTRAVENOUS | Status: DC
  Administered 2021-09-12: 3 mL via INTRAVENOUS

## 2021-09-11 MED ORDER — NITROGLYCERIN IN D5W 200-5 MCG/ML-% IV SOLN: 0.0000 ug/min | INTRAVENOUS | Status: DC

## 2021-09-11 MED ORDER — HYDRALAZINE HCL 20 MG/ML IJ SOLN: 10.0000 mg | INTRAMUSCULAR | Status: AC | PRN

## 2021-09-11 MED ORDER — NITROGLYCERIN 0.4 MG SL SUBL
0.4000 mg | SUBLINGUAL_TABLET | SUBLINGUAL | Status: DC | PRN
Start: 2021-09-11 — End: 2021-09-11

## 2021-09-11 MED ORDER — TICAGRELOR 90 MG PO TABS
180.0000 mg | ORAL_TABLET | Freq: Once | ORAL | Status: AC
  Administered 2021-09-11: 180 mg via ORAL
  Filled 2021-09-11: qty 2

## 2021-09-11 MED ORDER — METOPROLOL SUCCINATE ER 25 MG PO TB24
12.5000 mg | ORAL_TABLET | Freq: Every day | ORAL | Status: DC
  Administered 2021-09-11 – 2021-09-12 (×2): 12.5 mg via ORAL
  Filled 2021-09-11 (×2): qty 1

## 2021-09-11 MED ORDER — HEPARIN SODIUM (PORCINE) 1000 UNIT/ML IJ SOLN
INTRAMUSCULAR | Status: AC
  Filled 2021-09-11: qty 10

## 2021-09-11 MED ORDER — LABETALOL HCL 5 MG/ML IV SOLN: 10.0000 mg | INTRAVENOUS | Status: AC | PRN

## 2021-09-11 MED ORDER — NITROGLYCERIN IN D5W 200-5 MCG/ML-% IV SOLN: 5.0000 ug/min | INTRAVENOUS | Status: DC

## 2021-09-11 MED ORDER — VENLAFAXINE HCL ER 75 MG PO CP24
75.0000 mg | ORAL_CAPSULE | Freq: Every evening | ORAL | Status: DC
  Filled 2021-09-11 (×2): qty 1

## 2021-09-11 MED ORDER — HEPARIN BOLUS VIA INFUSION
4000.0000 [IU] | Freq: Once | INTRAVENOUS | Status: AC
Start: 2021-09-11 — End: 2021-09-11
  Administered 2021-09-11: 4000 [IU] via INTRAVENOUS
  Filled 2021-09-11: qty 4000

## 2021-09-11 MED ORDER — HEPARIN (PORCINE) 25000 UT/250ML-% IV SOLN
1250.0000 [IU]/h | INTRAVENOUS | Status: DC
  Administered 2021-09-11: 1250 [IU]/h via INTRAVENOUS
  Filled 2021-09-11: qty 250

## 2021-09-11 MED ORDER — HEPARIN (PORCINE) IN NACL 1000-0.9 UT/500ML-% IV SOLN
INTRAVENOUS | Status: DC | PRN
  Administered 2021-09-11 (×2): 500 mL

## 2021-09-11 MED ORDER — SODIUM CHLORIDE 0.9 % IV SOLN: 250.0000 mL | INTRAVENOUS | Status: DC | PRN

## 2021-09-11 MED ORDER — IOHEXOL 350 MG/ML SOLN
INTRAVENOUS | Status: DC | PRN
  Administered 2021-09-11: 230 mL

## 2021-09-11 MED ORDER — PANTOPRAZOLE SODIUM 40 MG PO TBEC
40.0000 mg | DELAYED_RELEASE_TABLET | Freq: Every day | ORAL | Status: DC | PRN
Start: 2021-09-11 — End: 2021-09-12

## 2021-09-11 MED ORDER — NITROGLYCERIN 1 MG/10 ML FOR IR/CATH LAB
INTRA_ARTERIAL | Status: AC
  Filled 2021-09-11: qty 10

## 2021-09-11 MED ORDER — INSULIN ASPART 100 UNIT/ML IJ SOLN
0.0000 [IU] | Freq: Three times a day (TID) | INTRAMUSCULAR | Status: DC
  Administered 2021-09-11: 3 [IU] via SUBCUTANEOUS
  Administered 2021-09-12: 2 [IU] via SUBCUTANEOUS
  Administered 2021-09-12: 3 [IU] via SUBCUTANEOUS

## 2021-09-11 MED ORDER — TICAGRELOR 90 MG PO TABS
90.0000 mg | ORAL_TABLET | Freq: Two times a day (BID) | ORAL | Status: DC
  Administered 2021-09-11 – 2021-09-12 (×2): 90 mg via ORAL
  Filled 2021-09-11 (×2): qty 1

## 2021-09-11 MED ORDER — FLUTICASONE PROPIONATE 50 MCG/ACT NA SUSP: 1.0000 | Freq: Every day | NASAL | Status: DC | PRN

## 2021-09-11 MED ORDER — EZETIMIBE 10 MG PO TABS
10.0000 mg | ORAL_TABLET | Freq: Every day | ORAL | Status: DC
  Administered 2021-09-12: 10 mg via ORAL
  Filled 2021-09-11 (×2): qty 1

## 2021-09-11 MED ORDER — HEPARIN (PORCINE) IN NACL 1000-0.9 UT/500ML-% IV SOLN
INTRAVENOUS | Status: AC
Start: 2021-09-11 — End: ?
  Filled 2021-09-11: qty 1000

## 2021-09-11 MED ORDER — LIDOCAINE HCL (PF) 1 % IJ SOLN
INTRAMUSCULAR | Status: AC
  Filled 2021-09-11: qty 30

## 2021-09-11 MED ORDER — DIAZEPAM 5 MG PO TABS: 5.0000 mg | ORAL_TABLET | Freq: Four times a day (QID) | ORAL | Status: DC | PRN

## 2021-09-11 MED ORDER — VIPERSLIDE LUBRICANT OPTIME: TOPICAL | Status: DC | PRN

## 2021-09-11 MED ORDER — LORATADINE 10 MG PO TABS
10.0000 mg | ORAL_TABLET | Freq: Every day | ORAL | Status: DC
  Administered 2021-09-11 – 2021-09-12 (×2): 10 mg via ORAL
  Filled 2021-09-11 (×2): qty 1

## 2021-09-11 MED ORDER — ASPIRIN 81 MG PO CHEW: 81.0000 mg | CHEWABLE_TABLET | ORAL | Status: DC

## 2021-09-11 MED ORDER — MIDAZOLAM HCL 2 MG/2ML IJ SOLN
INTRAMUSCULAR | Status: DC | PRN
  Administered 2021-09-11: 2 mg via INTRAVENOUS
  Administered 2021-09-11: .5 mg via INTRAVENOUS

## 2021-09-11 MED ORDER — FENTANYL CITRATE (PF) 100 MCG/2ML IJ SOLN
INTRAMUSCULAR | Status: DC | PRN
  Administered 2021-09-11: 12.5 ug via INTRAVENOUS
  Administered 2021-09-11: 25 ug via INTRAVENOUS

## 2021-09-11 MED ORDER — ROSUVASTATIN CALCIUM 20 MG PO TABS
20.0000 mg | ORAL_TABLET | Freq: Every day | ORAL | Status: DC
  Filled 2021-09-11: qty 1

## 2021-09-11 SURGICAL SUPPLY — 27 items
BALL SAPPHIRE NC24 3.75X15 (BALLOONS) ×2
BALLN SAPPHIRE 2.0X20 (BALLOONS) ×2
BALLN SCOREFLEX 2.50X15 (BALLOONS) ×2
BALLOON SAPPHIRE 2.0X20 (BALLOONS) IMPLANT
BALLOON SAPPHIRE NC24 3.75X15 (BALLOONS) IMPLANT
BALLOON SCOREFLEX 2.50X15 (BALLOONS) IMPLANT
CATH OPTICROSS HD (CATHETERS) ×1 IMPLANT
CATH VISTA GUIDE 7FRXB LAD 3.5 (CATHETERS) ×1 IMPLANT
DEVICE RAD COMP TR BAND LRG (VASCULAR PRODUCTS) ×1 IMPLANT
ELECT DEFIB PAD ADLT CADENCE (PAD) ×1 IMPLANT
GLIDESHEATH SLENDER 7FR .021G (SHEATH) ×1 IMPLANT
GUIDEWIRE INQWIRE 1.5J.035X260 (WIRE) IMPLANT
INQWIRE 1.5J .035X260CM (WIRE) ×2
KIT ENCORE 26 ADVANTAGE (KITS) ×1 IMPLANT
KIT HEART LEFT (KITS) ×2 IMPLANT
LUBRICANT VIPERSLIDE CORONARY (MISCELLANEOUS) ×1 IMPLANT
MAT PREVALON FULL STRYKER (MISCELLANEOUS) ×1 IMPLANT
PACK CARDIAC CATHETERIZATION (CUSTOM PROCEDURE TRAY) ×2 IMPLANT
SHEATH PROBE COVER 6X72 (BAG) ×1 IMPLANT
SLED PULL BACK IVUS (MISCELLANEOUS) ×1 IMPLANT
STENT SYNERGY XD 3.50X24 (Permanent Stent) IMPLANT
SYNERGY XD 3.50X24 (Permanent Stent) ×2 IMPLANT
TRANSDUCER W/STOPCOCK (MISCELLANEOUS) ×2 IMPLANT
TUBING CIL FLEX 10 FLL-RA (TUBING) ×2 IMPLANT
WIRE ASAHI PROWATER 180CM (WIRE) ×1 IMPLANT
WIRE COUGAR XT STRL 190CM (WIRE) ×1 IMPLANT
WIRE COUGAR XT STRL 300CM (WIRE) ×1 IMPLANT

## 2021-09-11 NOTE — Interval H&P Note (Signed)
Cath Lab Visit (complete for each Cath Lab visit)  Clinical Evaluation Leading to the Procedure:   ACS: No.  Non-ACS:    Anginal Classification: CCS IV  Anti-ischemic medical therapy: Maximal Therapy (2 or more classes of medications)  Non-Invasive Test Results: No non-invasive testing performed  Prior CABG: No previous CABG      History and Physical Interval Note:  09/11/2021 10:44 AM  Michael Sandoval  has presented today for surgery, with the diagnosis of cad.  The various methods of treatment have been discussed with the patient and family. After consideration of risks, benefits and other options for treatment, the patient has consented to  Procedure(s): CORONARY ATHERECTOMY (N/A) as a surgical intervention.  The patient's history has been reviewed, patient examined, no change in status, stable for surgery.  I have reviewed the patient's chart and labs.  Questions were answered to the patient's satisfaction.     Shelva Majestic

## 2021-09-11 NOTE — Progress Notes (Signed)
Patient states he still has between 0.5 and 1/10 chest pressure. Dr. Claiborne Billings paged and informed. NTG drip started at 50mg per order.

## 2021-09-11 NOTE — Telephone Encounter (Signed)
Patient Research scientist (life sciences) completed.     The patient is currently admitted and upon discharge could be taking Brilinta.   The current 30 day co-pay is, $0.   The patient is insured through [employer].

## 2021-09-11 NOTE — ED Provider Notes (Signed)
[facility] EMERGENCY DEPARTMENT Provider Note  CSN: [id number] Arrival date & time: 09/10/21 2144  Chief Complaint(s) Chest Pain and Shortness of Breath  HPI Michael Sandoval is a 56 y.o. male with a past medical history listed below including insulin-dependent type 2 diabetes, hypertension, hyperlipidemia, known CAD noted on the catheterization revealing mostly in the LAD who had an NSTEMI within the [facility] on July 25 that was supposed to be transferred here for evaluation but patient left before transfer.  He is being established with Dr. Ali Lowe and was evaluated by Dr. Darcey Nora for possible bypass surgery.  It appears that PCI and stenting was recommended.  He returns today for chest pain similar to his prior NSTEMI.  Patient has taken a total of 9 nitroglycerin throughout the day.  Patient presented earlier and had labs but left after triage.  With that work-up patient's troponin was slightly elevated.  Tonight around 9 PM the pain became significantly worse prompting his visit.  Currently he is asymptomatic.  The history is provided by the patient and medical records.    Past Medical History Past Medical History:  Diagnosis Date   Anxiety    Arthritis    Cancer ([place])    melanoma skin cancer    Daytime somnolence    Decreased hearing    Depression    Diabetes mellitus, type II (HCC)        Dyslipidemia    Elevated blood pressure reading    Elevated coronary artery calcium score    Excessive daytime sleepiness    GERD (gastroesophageal reflux disease)    HTN (hypertension)    HTN (hypertension)    Hyperlipidemia    Lightheadedness    Non-restorative sleep    Obesity    Osteoarthritis of hip    Pneumonia    1990    Sinusitis    Unspecified disorder of nose and nasal sinuses    Patient Active Problem List   Diagnosis Date Noted   History of atherosclerotic heart disease 09/10/2021   Chronic sinusitis 09/06/2021   Daytime somnolence  09/06/2021   Elevated blood-pressure reading, without diagnosis of hypertension 09/06/2021   Gastroesophageal reflux disease 09/06/2021   Hyperlipidemia 09/06/2021   Mixed anxiety and depressive disorder 09/06/2021   Sleep disorder 09/06/2021   Type 2 diabetes mellitus with other specified complication ([place]) 82/95/6213   Unspecified disorder of nose and nasal sinuses 09/06/2021   Morbid obesity ([place]) 09/05/2021   Essential hypertension 09/04/2021   NSTEMI (non-ST elevated myocardial infarction) ([place]) 09/04/2021   History of total hip arthroplasty 12/31/2018   Osteoarthritis of right hip 07/09/2018   Right hand fracture 11/27/2007   Home Medication(s) Prior to Admission medications   Medication Sig Start Date End Date Taking? Authorizing Provider  aspirin 81 MG chewable tablet Chew 1 tablet (81 mg total) by mouth 2 (two) times daily. 07/10/18   Swinteck, Aaron Edelman, MD  cetirizine (ZYRTEC) 10 MG tablet Take 10 mg by mouth as needed for allergies.    [provider]  fluticasone (FLONASE) 50 MCG/ACT nasal spray Place 1 spray into both nostrils daily.    [provider]  glucose blood test strip 1 each by Other route as needed for other. Use as instructed One Touch Verio (Blood Glucose Test) One Touch Delica Lancets 08M    [provider]  ibuprofen (ADVIL,MOTRIN) 200 MG tablet Take 400 mg by mouth every 6 (six) hours as needed (PAIN).     [provider]  isosorbide mononitrate (IMDUR) 30 MG 24 hr tablet Take 1 tablet (30 mg total) by mouth daily. 08/20/21   Early Osmond, MD  lisinopril (PRINIVIL,ZESTRIL) 10 MG tablet Take 10 mg by mouth daily. 07/20/15   [provider]  loratadine (CLARITIN) 10 MG tablet Take 10 mg by mouth daily as needed for allergies.    [provider]  meclizine (ANTIVERT) 25 MG tablet Take 1 tablet by mouth as needed. 08/15/21   [provider]  metoprolol succinate (TOPROL XL) 25 MG 24 hr tablet Take 0.5  tablets (12.5 mg total) by mouth daily. 08/20/21   Early Osmond, MD  Multiple Vitamins-Minerals (MULTIVITAMIN WITH MINERALS) tablet Take 1 tablet by mouth daily.    [provider]  nitroGLYCERIN (NITROSTAT) 0.4 MG SL tablet Place 1 tablet (0.4 mg total) under the tongue every 5 (five) minutes as needed for chest pain. 08/20/21 11/18/21  Early Osmond, MD  NOVOLIN 70/30 KWIKPEN (70-30) 100 UNIT/ML KwikPen Inject 20 Units into the skin 2 (two) times daily. 05/15/21   [provider]  omeprazole (PRILOSEC OTC) 20 MG tablet Take 1 tablet by mouth as needed.    [provider]  venlafaxine XR (EFFEXOR-XR) 75 MG 24 hr capsule Take 75 mg by mouth daily. 04/30/21   [provider]  XIGDUO XR 06-998 MG TB24 Take 1 tablet by mouth 2 (two) times daily. 07/22/21   [provider]                                                                                                                                    Allergies Onion and Capsicum  Review of Systems Review of Systems As noted in HPI  Physical Exam Vital Signs  I have reviewed the triage vital signs BP (!) 109/57   Pulse 73   Temp 97.8 F (36.6 C)   Resp 17   Ht '5\' 9"'$  (1.753 m)   Wt 122.5 kg   SpO2 95%   BMI 39.87 kg/m   Physical Exam Vitals reviewed.  Constitutional:      General: He is not in acute distress.    Appearance: He is well-developed. He is obese. He is not diaphoretic.  HENT:     Head: Normocephalic and atraumatic.     Nose: Nose normal.  Eyes:     General: No scleral icterus.       Right eye: No discharge.        Left eye: No discharge.     Conjunctiva/sclera: Conjunctivae normal.     Pupils: Pupils are equal, round, and reactive to light.  Cardiovascular:     Rate and Rhythm: Normal rate and regular rhythm.     Heart sounds: No murmur heard.    No friction rub. No gallop.  Pulmonary:     Effort: Pulmonary effort is normal. No respiratory distress.     Breath  sounds: Normal  breath sounds. No stridor. No rales.  Abdominal:     General: There is no distension.     Palpations: Abdomen is soft.     Tenderness: There is no abdominal tenderness.  Musculoskeletal:        General: No tenderness.     Cervical back: Normal range of motion and neck supple.  Skin:    General: Skin is warm and dry.     Findings: No erythema or rash.  Neurological:     Mental Status: He is alert and oriented to person, place, and time.     ED Results and Treatments Labs (all labs ordered are listed, but only abnormal results are displayed) Labs Reviewed  URINALYSIS, ROUTINE W REFLEX MICROSCOPIC - Abnormal; Notable for the following components:      Result Value   Glucose, UA >=500 (*)    Ketones, ur 20 (*)    All other components within normal limits  TROPONIN I (HIGH SENSITIVITY) - Abnormal; Notable for the following components:   Troponin I (High Sensitivity) 43 (*)    All other components within normal limits  TROPONIN I (HIGH SENSITIVITY) - Abnormal; Notable for the following components:   Troponin I (High Sensitivity) 78 (*)    All other components within normal limits  HEPARIN LEVEL (UNFRACTIONATED)  CBC                                                                                                                         EKG  EKG Interpretation  Date/Time:  Monday September 10 2021 22:01:33 EDT Ventricular Rate:  75 PR Interval:  168 QRS Duration: 80 QT Interval:  410 QTC Calculation: 457 R Axis:   24 Text Interpretation: Sinus rhythm with sinus arrhythmia with Fusion complexes Cannot rule out Anterior infarct , age undetermined Abnormal ECG  ST changes in similar distribution to Dec 2022 Confirmed by Sherwood Gambler [phone]) on 09/10/2021 10:20:40 PM       Radiology DG Chest 2 View  Result Date: 09/10/2021 CLINICAL DATA:  Chest pain EXAM: CHEST - 2 VIEW COMPARISON:  Radiograph 01/24/2021 FINDINGS: Cardiomediastinal silhouette is within normal  limits. There is no focal airspace consolidation. There is no pleural effusion. No pneumothorax. No acute osseous abnormality. Thoracic spondylosis. IMPRESSION: No evidence of acute cardiopulmonary disease. Electronically Signed   By: Maurine Simmering M.D.   On: 09/10/2021 15:03    Pertinent labs & imaging results that were available during my care of the patient were reviewed by me and considered in my medical decision making (see MDM for details).  Medications Ordered in ED Medications  heparin ADULT infusion 100 units/mL (25000 units/221m) (1,250 Units/hr Intravenous New Bag/Given 09/11/21 0114)  aspirin EC tablet 243 mg (243 mg Oral Given 09/10/21 2202)  0.9 %  sodium chloride infusion (0 mLs Intravenous Stopped 09/11/21 0116)  heparin bolus via infusion 4,000 Units (4,000 Units Intravenous Bolus from Bag 09/11/21 0114)  Procedures .Critical Care  Performed by: Fatima Blank, MD Authorized by: Fatima Blank, MD   Critical care provider statement:    Critical care time (minutes):  45   Critical care time was exclusive of:  Separately billable procedures and treating other patients   Critical care was necessary to treat or prevent imminent or life-threatening deterioration of the following conditions:  Cardiac failure   Critical care was time spent personally by me on the following activities:  Development of treatment plan with patient or surrogate, discussions with consultants, evaluation of patient's response to treatment, examination of patient, obtaining history from patient or surrogate, review of old charts, re-evaluation of patient's condition, pulse oximetry, ordering and review of radiographic studies, ordering and review of laboratory studies and ordering and performing treatments and interventions   Care discussed with: admitting provider      (including critical care time)  Medical Decision Making / ED Course    Complexity of Problem:  Co-morbidities/SDOH that complicate the patient evaluation/care: Noted above in HPI including CAD with 85% blockage in the LAD  Additional history obtained: Medical records from [facility] and notes from Dr. Darcey Nora noted above.  Patient's presenting problem/concern, DDX, and MDM listed below: Chest pain Most consistent with ACS Patient is currently chest pain-free We will repeat EKG and troponins. His presentation is not suspicious for pulmonary edema, aortic dissection or esophageal perforation.  Doubt pneumothorax or pneumonia.  Hospitalization Considered:  yes  Initial Intervention:  Patient was given aspirin Started on heparin drip    Complexity of Data:   Cardiac Monitoring: The patient was maintained on a cardiac monitor.   I personally viewed and interpreted the cardiac monitored which showed an underlying rhythm of normal sinus rhythm with rates in the 70s EKG without acute ischemic changes or evidence of pericarditis.  He does have similar ST segment changes as his prior tracings.  Laboratory Tests ordered listed below with my independent interpretation: CBC from earlier today notable for leukocytosis.  No anemia. Metabolic panel without significant electrolyte derangement or renal sufficiency. Troponins are trending   Imaging Studies ordered listed below with my independent interpretation: On my read of the chest x-ray, there was no evidence suggestive of pneumonia, pneumothorax, pneumomediastinum, pulmonary edema concerning for new or exacerbation of heart failure, abnormal contour of the mediastinum to suggest dissection, and no evidence of acute injuries.      ED Course:    Assessment, Add'l Intervention, and Reassessment: Chest pain Consistent with ACS We will consult cardiology for admission and likely PCI    Final Clinical Impression(s) / ED  Diagnoses Final diagnoses:  ACS (acute coronary syndrome) ([place])           This chart was dictated using voice recognition software.  Despite best efforts to proofread,  errors can occur which can change the documentation meaning.    Fatima Blank, MD 09/11/21 0140

## 2021-09-11 NOTE — Progress Notes (Signed)
ANTICOAGULATION CONSULT NOTE  Pharmacy Consult for Heparin Indication: chest pain/ACS  Allergies  Allergen Reactions   Onion Diarrhea   Atorvastatin Diarrhea   Capsicum Diarrhea and Nausea Only    Bell peppers    Patient Measurements: Height: '5\' 9"'$  (175.3 cm) Weight: 122.5 kg (270 lb) IBW/kg (Calculated) : 70.7 Heparin Dosing Weight: 98.6kg  Vital Signs: Temp: 98 F (36.7 C) (08/01 0747) Temp Source: Oral (08/01 0747) BP: 124/76 (08/01 1000) Pulse Rate: 73 (08/01 1000)  Labs: Recent Labs    09/10/21 1440 09/10/21 1708 09/10/21 2211 09/10/21 2357 09/11/21 0503 09/11/21 0753  HGB 15.2  --   --   --  13.7  --   HCT 46.2  --   --   --  42.2  --   PLT 290  --   --   --  257  --   LABPROT 12.8  --   --   --   --   --   INR 1.0  --   --   --   --   --   HEPARINUNFRC  --   --   --   --   --  0.34  CREATININE 1.08  --   --   --   --   --   TROPONINIHS 25* 47* 43* 78*  --   --      Estimated Creatinine Clearance: 96.4 mL/min (by C-G formula based on SCr of 1.08 mg/dL).   Medical History: Past Medical History:  Diagnosis Date   Anxiety    Arthritis    Cancer ([place])    melanoma skin cancer    Daytime somnolence    Decreased hearing    Depression    Diabetes mellitus, type II (HCC)        Dyslipidemia    Elevated blood pressure reading    Elevated coronary artery calcium score    Excessive daytime sleepiness    GERD (gastroesophageal reflux disease)    HTN (hypertension)    HTN (hypertension)    Hyperlipidemia    Lightheadedness    Non-restorative sleep    Obesity    Osteoarthritis of hip    Pneumonia    1990    Sinusitis    Unspecified disorder of nose and nasal sinuses     Assessment: 56 yo male presented on 09/10/2021 with chest pain. Pharmacy consulted to dose heparin for NSTEMI. Patient is not on anticoagulation prior to admission. Initial heparin level therapeutic at 0.34. Patient has cath planned 8/1 AM. CBC WNL. No bleed issues  reported.  Goal of Therapy:  Heparin level 0.3-0.7 units/ml Monitor platelets by anticoagulation protocol: Yes   Plan:  Continue heparin at 1250 units/hr Monitor daily heparin level and CBC, s/sx bleeding F/u plans post-cath 8/1   Arturo Morton, PharmD, BCPS Please check AMION for all [facility] contact numbers Clinical Pharmacist 09/11/2021 10:06 AM

## 2021-09-11 NOTE — Progress Notes (Signed)
  Transition of Care [facility]) Screening Note   Patient Details  Name: CAMBREN HELM Date of Birth: Jul 07, 1965   Transition of Care [facility]) CM/SW Contact:    Milas Gain, [place] Phone Number: 09/11/2021, 5:02 PM    Transition of Care Department [facility]) has reviewed patient and no TOC needs have been identified at this time. We will continue to monitor patient advancement through interdisciplinary progression rounds. If new patient transition needs arise, please place a TOC consult.

## 2021-09-11 NOTE — H&P (Addendum)
Cardiology Admission History and Physical:   Patient ID: Michael Sandoval MRN: [id number]; DOB: Apr 30, 1965   Admission date: 09/10/2021  PCP:  London Pepper, MD   [facility] Providers Cardiologist:  Early Osmond, MD        Chief Complaint:  Chest pain  Patient Profile:   Michael Sandoval is a 56 y.o. male with pmh sx for  insulin-dependent type 2 diabetes, hypertension, hyperlipidemia, known CAD noted on the catheterization revealing mostly in the LAD who had an NSTEMI within the [facility] on July 25 who is now being seen 09/11/2021 for the evaluation of recurrent chest pain and another NSTEMI.  History of Present Illness:   Michael Sandoval is a 56 y.o. male with pmh sx for  insulin-dependent type 2 diabetes, hypertension, hyperlipidemia, known CAD noted on the catheterization revealing mostly in the LAD who had an NSTEMI within the [facility] on July 25 who is now being seen 09/11/2021 for the evaluation of recurrent chest pain and another NSTEMI. He had an NSTEMI last week at [facility] where a cardiac cath demonstrated significant 85 to 90% stenosis of the proximal-mid LAD and 85% stenosis of a small circumflex marginal.  He was discharged for follow-up at [facility]. He saw Dr Darcey Nora of CT surgery on 09/10/21 where it was felt His LAD is the culprit vessel with a somewhat long 85 to 90% stenosis.  It does not appear to involve a diagonal branch.  The circumflex vessel with significant stenosis is very small and nongraftable.  The right coronary is normal.  EF is normal by cath. Hence PCI would be better. He was supposed to follow up as outpatient however has been having intermittent chest pain since this afternoon requiring 7 nitro tablets. Hence came to the ED; troponin mildly elevated; no significant change in the EKG. Heparin was started. Cardiology was called for admission. Currently he denies CP or any SOB. VS are stable in the ED.    Past Medical History:   Diagnosis Date   Anxiety    Arthritis    Cancer ([place])    melanoma skin cancer    Daytime somnolence    Decreased hearing    Depression    Diabetes mellitus, type II ([place])        Dyslipidemia    Elevated blood pressure reading    Elevated coronary artery calcium score    Excessive daytime sleepiness    GERD (gastroesophageal reflux disease)    HTN (hypertension)    HTN (hypertension)    Hyperlipidemia    Lightheadedness    Non-restorative sleep    Obesity    Osteoarthritis of hip    Pneumonia    1990    Sinusitis    Unspecified disorder of nose and nasal sinuses     Past Surgical History:  Procedure Laterality Date   colonoscopy   2006   HAND SURGERY     crush injury , small screw in place per surgeon    skin cancer   several years ago   [place] Right 07/09/2018   Procedure: TOTAL HIP ARTHROPLASTY ANTERIOR APPROACH;  Surgeon: Rod Can, MD;  Location: [facility];  Service: Orthopedics;  Laterality: Right;     Medications Prior to Admission: Prior to Admission medications   Medication Sig Start Date End Date Taking? Authorizing Provider  aspirin 81 MG chewable tablet Chew 1 tablet (81 mg total) by mouth 2 (  two) times daily. Patient taking differently: Chew 81 mg by mouth daily. 07/10/18  Yes Swinteck, Aaron Edelman, MD  cetirizine (ZYRTEC) 10 MG tablet Take 10 mg by mouth as needed for allergies.   Yes [provider]  fluticasone (FLONASE) 50 MCG/ACT nasal spray Place 1 spray into both nostrils daily as needed for allergies.   Yes [provider]  ibuprofen (ADVIL,MOTRIN) 200 MG tablet Take 400 mg by mouth every 6 (six) hours as needed (PAIN).    Yes [provider]  isosorbide mononitrate (IMDUR) 30 MG 24 hr tablet Take 1 tablet (30 mg total) by mouth daily. Patient taking differently: Take 30 mg by mouth at bedtime. 08/20/21  Yes Early Osmond, MD  lisinopril (PRINIVIL,ZESTRIL) 10 MG tablet  Take 10 mg by mouth daily. 07/20/15  Yes [provider]  loratadine (CLARITIN) 10 MG tablet Take 10 mg by mouth daily as needed for allergies.   Yes [provider]  meclizine (ANTIVERT) 25 MG tablet Take 1 tablet by mouth as needed for dizziness. 08/15/21  Yes [provider]  metoprolol succinate (TOPROL XL) 25 MG 24 hr tablet Take 0.5 tablets (12.5 mg total) by mouth daily. 08/20/21  Yes Early Osmond, MD  Multiple Vitamins-Minerals (MULTIVITAMIN WITH MINERALS) tablet Take 1 tablet by mouth daily.   Yes [provider]  nitroGLYCERIN (NITROSTAT) 0.4 MG SL tablet Place 1 tablet (0.4 mg total) under the tongue every 5 (five) minutes as needed for chest pain. 08/20/21 11/18/21 Yes Vetta Couzens, Arlie Solomons, MD  NOVOLIN 70/30 KWIKPEN (70-30) 100 UNIT/ML KwikPen Inject 35 Units into the skin 2 (two) times daily. 05/15/21  Yes [provider]  omeprazole (PRILOSEC OTC) 20 MG tablet Take 1 tablet by mouth daily as needed (for acid reflux).   Yes [provider]  venlafaxine XR (EFFEXOR-XR) 75 MG 24 hr capsule Take 75 mg by mouth every evening. 04/30/21  Yes [provider]  XIGDUO XR 06-998 MG TB24 Take 1 tablet by mouth 2 (two) times daily. 07/22/21  Yes [provider]  glucose blood test strip 1 each by Other route as needed for other. Use as instructed One Touch Verio (Blood Glucose Test) One Touch Delica Lancets 95M    [provider]     Allergies:    Allergies  Allergen Reactions   Onion Diarrhea   Atorvastatin Diarrhea   Capsicum Diarrhea and Nausea Only    Bell peppers    Social History:   Social History   Socioeconomic History   Marital status: Single    Spouse name: Not on file   Number of children: Not on file   Years of education: Not on file   Highest education level: Not on file  Occupational History   Not on file  Tobacco Use   Smoking status: Never   Smokeless tobacco: Never  Vaping Use   Vaping Use:  Never used  Substance and Sexual Activity   Alcohol use: Yes    Comment: rare   Drug use: No   Sexual activity: Not on file  Other Topics Concern   Not on file  Social History Narrative   Works Hotel manager support.     Right handed    Social Determinants of Health   Financial Resource Strain: Not on file  Food Insecurity: Not on file  Transportation Needs: Not on file  Physical Activity: Not on file  Stress: Not on file  Social Connections: Not on file  Intimate Partner Violence: Not  on file    Family History:   The patient's family history includes Heart attack (age of onset: 95) in his mother; Hypertension in his father.    ROS:  Please see the history of present illness.  All other ROS reviewed and negative.     Physical Exam/Data:   Vitals:   09/11/21 0015 09/11/21 0045 09/11/21 0100 09/11/21 0115  BP: 123/62 109/65 119/65 (!) 109/57  Pulse: 74 76 73 73  Resp: '15 17 17 17  '$ Temp:      SpO2: 96% 95% 96% 95%  Weight:      Height:        Intake/Output Summary (Last 24 hours) at 09/11/2021 0310 Last data filed at 09/11/2021 0116 Gross per 24 hour  Intake 4.05 ml  Output --  Net 4.05 ml      09/10/2021   11:56 PM 09/10/2021   10:58 AM 08/20/2021    9:34 AM  Last 3 Weights  Weight (lbs) 270 lb 270 lb 271 lb  Weight (kg) 122.471 kg 122.471 kg 122.925 kg     Body mass index is 39.87 kg/m.  General:  Well nourished, well developed, in no acute distress HEENT: normal Neck: no JVD Vascular: No carotid bruits; Distal pulses 2+ bilaterally   Cardiac:  normal S1, S2; RRR; no murmur  Lungs:  clear to auscultation bilaterally, no wheezing, rhonchi or rales  Abd: soft, nontender, no hepatomegaly  Ext: no edema Musculoskeletal:  No deformities, BUE and BLE strength normal and equal Skin: warm and dry  Neuro:  CNs 2-12 intact, no focal abnormalities noted Psych:  Normal affect    EKG:  The ECG that was done  was personally reviewed and demonstrates no ST  elevation  Relevant CV Studies:  A cardiac cath demonstrated significant 85 to 90% stenosis of the proximal-mid LAD and 85% stenosis of a small circumflex marginal. His LAD is the culprit vessel with a somewhat long 85 to 90% stenosis.  It does not appear to involve a diagonal branch. The circumflex vessel with significant stenosis is very small and nongraftable.  The right coronary is normal.  EF is normal by cath.  Laboratory Data:  High Sensitivity Troponin:   Recent Labs  Lab 09/10/21 1440 09/10/21 1708 09/10/21 2211 09/10/21 2357  TROPONINIHS 25* 47* 43* 78*      Chemistry Recent Labs  Lab 09/10/21 1440  NA 138  K 4.5  CL 102  CO2 26  GLUCOSE 159*  BUN 17  CREATININE 1.08  CALCIUM 9.6  GFRNONAA >60  ANIONGAP 10    No results for input(s): "PROT", "ALBUMIN", "AST", "ALT", "ALKPHOS", "BILITOT" in the last 168 hours. Lipids No results for input(s): "CHOL", "TRIG", "HDL", "LABVLDL", "LDLCALC", "CHOLHDL" in the last 168 hours. Hematology Recent Labs  Lab 09/10/21 1440  WBC 14.2*  RBC 5.04  HGB 15.2  HCT 46.2  MCV 91.7  MCH 30.2  MCHC 32.9  RDW 13.5  PLT 290   Thyroid No results for input(s): "TSH", "FREET4" in the last 168 hours. BNPNo results for input(s): "BNP", "PROBNP" in the last 168 hours.  DDimer No results for input(s): "DDIMER" in the last 168 hours.   Radiology/Studies:  DG Chest 2 View  Result Date: 09/10/2021 CLINICAL DATA:  Chest pain EXAM: CHEST - 2 VIEW COMPARISON:  Radiograph 01/24/2021 FINDINGS: Cardiomediastinal silhouette is within normal limits. There is no focal airspace consolidation. There is no pleural effusion. No pneumothorax. No acute osseous abnormality. Thoracic spondylosis. IMPRESSION: No evidence  of acute cardiopulmonary disease. Electronically Signed   By: Maurine Simmering M.D.   On: 09/10/2021 15:03     Assessment and Plan:   # NSTEMI # Known CAD # DM2 on Insulin # HTN # Morbid Obesity  -A cardiac cath demonstrated  significant 85 to 90% stenosis of the proximal-mid LAD and 85% stenosis of a small circumflex marginal after an NSTEMI in late July -initially referred for CABG-however CT surgery felt His LAD is the culprit vessel with a somewhat long 85 to 90% stenosis.  It does not appear to involve a diagonal branch.  The circumflex vessel with significant stenosis is very small and nongraftable.  The right coronary is normal.  EF is normal by cath. With single-vessel surgical disease I will ask Dr. Ali Lowe to review the angiograms to see if patient is a candidate for PCI.   -In the interim, having more frequent chest pains with troponin rising -Will admit for possible PCI -Start Heparin drip -Load with aspirin -Cannot tolerate statins- will start zetia -Beta blocker and ACE-I -Echo in AM -Insulin for DM per protocol -Encouraged weight loss for morbid obesity  For questions or updates, please contact [facility] Please consult [email or web address] for contact info under     Signed, Michael Lazier, MD  09/11/2021 3:10 AM    ATTENDING ATTESTATION:  After conducting a review of all available clinical information with the care team, interviewing the patient, and performing a physical exam, I agree with the findings and plan described in this note.   GEN: No acute distress.   HEENT:  MMM, no JVD, no scleral icterus Cardiac: RRR, no murmurs, rubs, or gallops.  Respiratory: Clear to auscultation bilaterally. GI: Soft, nontender, non-distended  MS: No edema; No deformity. Neuro:  Nonfocal  Vasc:  +2 radial pulses  Patient known to me from the outpatient setting.  When I saw him he had stable anginal symptoms and was referred for coronary CTA.  In the interim he presented to [facility] and underwent cardiac catheterization which demonstrated high-grade proximal to mid LAD lesion high-grade distal obtuse marginal lesion and what looked to me like a moderate right coronary artery lesions.  He was seen  there and thought to be best served by surgical revascularization.  He was seen by cardiothoracic surgery here yesterday and the opinion was he would be best served by PCI of the proximal LAD and medical management of all other lesions.  Unfortunately he presented yesterday with increasing pain requiring multiple nitroglycerin to control.  Given the fact his symptoms cannot be controlled I believe PCI of the proximal to mid LAD is indicated.  He is currently not having chest pain.  He is on the schedule for coronary angiography and PCI this morning.  Further recommendations will follow.  I have reviewed the risks, indications, and alternatives to cardiac catheterization, possible angioplasty, and stenting with the patient. Risks include but are not limited to bleeding, infection, vascular injury, stroke, myocardial infection, arrhythmia, kidney injury, radiation-related injury in the case of prolonged fluoroscopy use, emergency cardiac surgery, and death. The patient understands the risks of serious complication is 1-2 in 0865 with diagnostic cardiac cath and 1-2% or less with angioplasty/stenting.    Lenna Sciara, MD Pager [phone]

## 2021-09-11 NOTE — Progress Notes (Signed)
ANTICOAGULATION CONSULT NOTE - Initial Consult  Pharmacy Consult for Heparin Indication: chest pain/ACS  Allergies  Allergen Reactions   Onion Other (See Comments)   Capsicum Diarrhea and Nausea Only    Patient Measurements: Height: '5\' 9"'$  (175.3 cm) Weight: 122.5 kg (270 lb) IBW/kg (Calculated) : 70.7 Heparin Dosing Weight: 98.6kg  Vital Signs: Temp: 97.8 F (36.6 C) (07/31 2158) Temp Source: Oral (07/31 1813) BP: 109/65 (08/01 0045) Pulse Rate: 76 (08/01 0045)  Labs: Recent Labs    09/10/21 1440 09/10/21 1708 09/10/21 2211  HGB 15.2  --   --   HCT 46.2  --   --   PLT 290  --   --   LABPROT 12.8  --   --   INR 1.0  --   --   CREATININE 1.08  --   --   TROPONINIHS 25* 47* 43*    Estimated Creatinine Clearance: 96.4 mL/min (by C-G formula based on SCr of 1.08 mg/dL).   Medical History: Past Medical History:  Diagnosis Date   Anxiety    Arthritis    Cancer ([place])    melanoma skin cancer    Daytime somnolence    Decreased hearing    Depression    Diabetes mellitus, type II ([facility])        Dyslipidemia    Elevated blood pressure reading    Elevated coronary artery calcium score    Excessive daytime sleepiness    GERD (gastroesophageal reflux disease)    HTN (hypertension)    HTN (hypertension)    Hyperlipidemia    Lightheadedness    Non-restorative sleep    Obesity    Osteoarthritis of hip    Pneumonia    1990    Sinusitis    Unspecified disorder of nose and nasal sinuses     Assessment: 56 yo male presented on 09/10/2021 with chest pain. Pharmacy consulted to dose heparin for ACS. Patient is not on anticoagulation prior to admission.    Goal of Therapy:  Heparin level 0.3-0.7 units/ml Monitor platelets by anticoagulation protocol: Yes   Plan:  Heparin 4000 unit bolus followed by heparin 1250 units/hr  Check 6 hr heparin level  Monitor heparin level, CBC and s/s of bleeding daily   Cristela Felt, PharmD, BCPS Clinical Pharmacist 09/11/2021 12:53  AM

## 2021-09-12 ENCOUNTER — Encounter (HOSPITAL_COMMUNITY): Payer: Self-pay | Admitting: Cardiovascular Disease

## 2021-09-12 ENCOUNTER — Other Ambulatory Visit (HOSPITAL_COMMUNITY): Payer: 59

## 2021-09-12 ENCOUNTER — Other Ambulatory Visit (HOSPITAL_COMMUNITY): Payer: Self-pay

## 2021-09-12 ENCOUNTER — Inpatient Hospital Stay (HOSPITAL_COMMUNITY): Payer: 59

## 2021-09-12 DIAGNOSIS — I214 Non-ST elevation (NSTEMI) myocardial infarction: Secondary | ICD-10-CM | POA: Diagnosis not present

## 2021-09-12 LAB — BASIC METABOLIC PANEL
Anion gap: 6 (ref 5–15)
BUN: 17 mg/dL (ref 6–20)
CO2: 22 mmol/L (ref 22–32)
Calcium: 8.1 mg/dL — ABNORMAL LOW (ref 8.9–10.3)
Chloride: 109 mmol/L (ref 98–111)
Creatinine, Ser: 0.96 mg/dL (ref 0.61–1.24)
GFR, Estimated: >60 mL/min (ref >60)
Glucose, Bld: 159 mg/dL — ABNORMAL HIGH (ref 70–99)
Potassium: 3.7 mmol/L (ref 3.5–5.1)
Sodium: 137 mmol/L (ref 135–145)

## 2021-09-12 LAB — ECHOCARDIOGRAM COMPLETE
Area-P 1/2: 2.12 cm2
Calc EF: 52.9 %
Height: 69 in
S' Lateral: 2.7 cm
Single Plane A2C EF: 52.2 %
Single Plane A4C EF: 53 %
Weight: 4320 oz

## 2021-09-12 LAB — CBC
HCT: 38.2 % — ABNORMAL LOW (ref 39.0–52.0)
Hemoglobin: 12.9 g/dL — ABNORMAL LOW (ref 13.0–17.0)
MCH: 30.5 pg (ref 26.0–34.0)
MCHC: 33.8 g/dL (ref 30.0–36.0)
MCV: 90.3 fL (ref 80.0–100.0)
Platelets: 208 10*3/uL (ref 150–400)
RBC: 4.23 MIL/uL (ref 4.22–5.81)
RDW: 13.3 % (ref 11.5–15.5)
WBC: 12.6 10*3/uL — ABNORMAL HIGH (ref 4.0–10.5)
nRBC: 0 % (ref 0.0–0.2)

## 2021-09-12 LAB — GLUCOSE, CAPILLARY
Glucose-Capillary: 163 mg/dL — ABNORMAL HIGH (ref 70–99)
Glucose-Capillary: 155 mg/dL — ABNORMAL HIGH (ref 70–99)

## 2021-09-12 MED ORDER — TICAGRELOR 90 MG PO TABS
90.0000 mg | ORAL_TABLET | Freq: Two times a day (BID) | ORAL | 2 refills | Status: DC
  Filled 2021-09-12: qty 60, 30d supply, fill #0

## 2021-09-12 MED ORDER — EZETIMIBE 10 MG PO TABS
10.0000 mg | ORAL_TABLET | Freq: Every day | ORAL | 0 refills | Status: DC
  Filled 2021-09-12: qty 30, 30d supply, fill #0

## 2021-09-12 MED FILL — Lidocaine HCl Local Preservative Free (PF) Inj 1%: INTRAMUSCULAR | Qty: 30 | Status: AC

## 2021-09-12 MED FILL — Nitroglycerin IV Soln 100 MCG/ML in D5W: INTRA_ARTERIAL | Qty: 10 | Status: AC

## 2021-09-12 NOTE — Discharge Summary (Addendum)
Discharge Summary    Patient ID: Michael Sandoval MRN: [id number]; DOB: [birth year]  Admit date: 09/10/2021 Discharge date: 09/12/2021  PCP:  London Pepper, MD   [facility] Providers Cardiologist:  Early Osmond, MD     Discharge Diagnoses    Principal Problem:   NSTEMI (non-ST elevated myocardial infarction) [facility]) Active Problems:   Essential hypertension   Gastroesophageal reflux disease   Hyperlipidemia   Morbid obesity ([place])   Type 2 diabetes mellitus with other specified complication [facility])   History of atherosclerotic heart disease  Diagnostic Studies/Procedures    Cath: 09/11/21    2nd Mrg lesion is 85% stenosed.   1st Mrg lesion is 20% stenosed.   Mid LAD lesion is 90% stenosed.   A drug-eluting stent was successfully placed.   Post intervention, there is a 0% residual stenosis.   The patient had undergone prior diagnostic catheterization at [facility]) on September 04, 2021.  CD images were reviewed and revealed a long 85 to 90% LAD stenosis in the proximal to mid LAD but not including the large diagonal vessel and he had minimal narrowing in the high marginal vessel with focal 85% stenosis in a very small second marginal branch of the circumflex.  He had a normal dominant RCA. Subsequent surgical consultation with Dr. Nils Pyle was done yesterday who felt the patient would be best treated with PCI.  He was admitted last evening with recurrent chest pain and presents now for PCI to his LAD   Intravascular ultrasound showed mild to moderate calcification with the LAD vessel size at 3.75 mm.   PCI was performed with predilatation with a 2.0 x 20 mm balloon, Score-flex 2.5 x 15 mm cutting balloon with DES stenting with a 3.5 x 24 mm Synergy stent postdilated to 3.75 mm with the stenoses being reduced to 0% and brisk TIMI-3 flow.   RECOMMENDATION: Continue DAPT with aspirin/Brilinta for minimum of 12 months.  Initiate a retrial of lipid-lowering therapy with the  patient's history of previous diarrhea secondary to atorvastatin.  Optimal blood pressure control with target blood pressure less than 130/80 and optimal diabetic management.   Diagnostic Dominance: Right  Intervention   Echo: official read at the time of discharge  _____________   History of Present Illness     Michael Sandoval is a 56 y.o. male with pmh sx for  insulin-dependent type 2 diabetes, hypertension, hyperlipidemia, known CAD noted on the catheterization revealing mostly in the LAD who had an NSTEMI within the [facility] on July 25 who was seen 09/11/2021 for the evaluation of recurrent chest pain and another NSTEMI.  He had an NSTEMI last week at [facility] where a cardiac cath demonstrated significant 85 to 90% stenosis of the proximal-mid LAD and 85% stenosis of a small circumflex marginal.  He was discharged for follow-up at [facility] heart center. He saw Dr Darcey Nora of CT surgery on 09/10/21 where it was felt His LAD is the culprit vessel with a somewhat long 85 to 90% stenosis.  It does not appear to involve a diagonal branch.  The circumflex vessel with significant stenosis is very small and nongraftable.  The right coronary was normal.  EF is normal by cath. Hence PCI would be better. He was supposed to follow up as outpatient however has been having intermittent chest pain since this afternoon requiring 7 nitro tablets.  Presented to the ED; troponin mildly elevated; no significant change in the EKG. Heparin was started. Cardiology  was called for admission.   Hospital Course     CAD NSTEMI --High-sensitivity troponin 25>>47>> 43>> 78.  Underwent cardiac catheterization noted above with long 85 to 90% LAD stenosis treated with PCI/DES x1.  Plan for DAPT with aspirin/Brilinta for at least 1 year.  No recurrent chest pain.  Seen by cardiac rehab.  Echo completed, pending official read at the time of discharge.  --Continue aspirin, Brilinta, Toprol XL 12.'5mg'$  daily, lisinopril 10 mg  daily  Hyperlipidemia: LDL 141, HDL 48, triglycerides 232 --History of multiple statin intolerances --Has been referred to the lipid clinic as an outpatient --Continue Zetia 10 mg daily at discharge  DM: Hemoglobin A1c at PCP office 10.8 (09/05/21) -- Continue home insulin -- continue Xigduo  HTN: stable -- continue Toprol, lisinopril  General: Well developed, well nourished, male appearing in no acute distress. Head: Normocephalic, atraumatic.  Neck: Supple without bruits, JVD. Lungs:  Resp regular and unlabored, CTA. Heart: RRR, S1, S2, no S3, S4, or murmur; no rub. Abdomen: Soft, non-tender, non-distended with normoactive bowel sounds. No hepatomegaly. No rebound/guarding. No obvious abdominal masses. Extremities: No clubbing, cyanosis, edema. Distal pedal pulses are 2+ bilaterally. Right radial cath site stable without bruising or hematoma Neuro: Alert and oriented X 3. Moves all extremities spontaneously. Psych: Normal affect.   Patient was seen by Dr. Ali Lowe and deemed stable for discharge home. Follow up appt has been arranged in the office. Medications sent to the [facility] pharmacy. Educated by PharmD prior to discharge.   Did the patient have an acute coronary syndrome (MI, NSTEMI, STEMI, etc) this admission?:  Yes                               AHA/ACC Clinical Performance & Quality Measures: Aspirin prescribed? - Yes ADP Receptor Inhibitor (Plavix/Clopidogrel, Brilinta/Ticagrelor or Effient/Prasugrel) prescribed (includes medically managed patients)? - Yes Beta Blocker prescribed? - Yes High Intensity Statin (Lipitor 40-'80mg'$  or Crestor 20-'40mg'$ ) prescribed? - No - intolerant, referred to lipid clinic EF assessed during THIS hospitalization? - Yes For EF <40%, was ACEI/ARB prescribed? - Not Applicable (EF >/= 63%) For EF <40%, Aldosterone Antagonist (Spironolactone or Eplerenone) prescribed? - Not Applicable (EF >/= 01%) Cardiac Rehab Phase II ordered (including medically  managed patients)? - Yes     The patient will be scheduled for a TOC follow up appointment in 10-14 days.  A message has been sent to the [facility] and Scheduling Pool at the office where the patient should be seen for follow up.  _____________  Discharge Vitals Blood pressure 128/80, pulse 74, temperature 97.7 F (36.5 C), temperature source Oral, resp. rate 15, height '5\' 9"'$  (1.753 m), weight 122.5 kg, SpO2 99 %.  Filed Weights   09/10/21 2356  Weight: 122.5 kg    Labs & Radiologic Studies    CBC Recent Labs    09/11/21 0503 09/12/21 0219  WBC 13.8* 12.6*  HGB 13.7 12.9*  HCT 42.2 38.2*  MCV 91.3 90.3  PLT 257 601   Basic Metabolic Panel Recent Labs    09/10/21 1440 09/12/21 0219  NA 138 137  K 4.5 3.7  CL 102 109  CO2 26 22  GLUCOSE 159* 159*  BUN 17 17  CREATININE 1.08 0.96  CALCIUM 9.6 8.1*   Liver Function Tests No results for input(s): "AST", "ALT", "ALKPHOS", "BILITOT", "PROT", "ALBUMIN" in the last 72 hours. No results for input(s): "LIPASE", "AMYLASE" in the last 72 hours.  High Sensitivity Troponin:   Recent Labs  Lab 09/10/21 1440 09/10/21 1708 09/10/21 2211 09/10/21 2357  TROPONINIHS 25* 47* 43* 78*    BNP Invalid input(s): "POCBNP" D-Dimer No results for input(s): "DDIMER" in the last 72 hours. Hemoglobin A1C No results for input(s): "HGBA1C" in the last 72 hours. Fasting Lipid Panel No results for input(s): "CHOL", "HDL", "LDLCALC", "TRIG", "CHOLHDL", "LDLDIRECT" in the last 72 hours. Thyroid Function Tests No results for input(s): "TSH", "T4TOTAL", "T3FREE", "THYROIDAB" in the last 72 hours.  Invalid input(s): "FREET3" _____________  CARDIAC CATHETERIZATION  Result Date: 09/11/2021   2nd Mrg lesion is 85% stenosed.   1st Mrg lesion is 20% stenosed.   Mid LAD lesion is 90% stenosed.   A drug-eluting stent was successfully placed.   Post intervention, there is a 0% residual stenosis. The patient had undergone prior diagnostic  catheterization at [facility]) on September 04, 2021.  CD images were reviewed and revealed a long 85 to 90% LAD stenosis in the proximal to mid LAD but not including the large diagonal vessel and he had minimal narrowing in the high marginal vessel with focal 85% stenosis in a very small second marginal branch of the circumflex.  He had a normal dominant RCA. Subsequent surgical consultation with Dr. Nils Pyle was done yesterday who felt the patient would be best treated with PCI.  He was admitted last evening with recurrent chest pain and presents now for PCI to his LAD Intravascular ultrasound showed mild to moderate calcification with the LAD vessel size at 3.75 mm. PCI was performed with predilatation with a 2.0 x 20 mm balloon, Score-flex 2.5 x 15 mm cutting balloon with DES stenting with a 3.5 x 24 mm Synergy stent postdilated to 3.75 mm with the stenoses being reduced to 0% and brisk TIMI-3 flow. RECOMMENDATION: Continue DAPT with aspirin/Brilinta for minimum of 12 months.  Initiate a retrial of lipid-lowering therapy with the patient's history of previous diarrhea secondary to atorvastatin.  Optimal blood pressure control with target blood pressure less than 130/80 and optimal diabetic management.   DG Chest 2 View  Result Date: 09/10/2021 CLINICAL DATA:  Chest pain EXAM: CHEST - 2 VIEW COMPARISON:  Radiograph 01/24/2021 FINDINGS: Cardiomediastinal silhouette is within normal limits. There is no focal airspace consolidation. There is no pleural effusion. No pneumothorax. No acute osseous abnormality. Thoracic spondylosis. IMPRESSION: No evidence of acute cardiopulmonary disease. Electronically Signed   By: Maurine Simmering M.D.   On: 09/10/2021 15:03    Disposition   Pt is being discharged home today in good condition.  Follow-up Plans & Appointments     Follow-up Information     [place], Crista [place], [place] Follow up on 09/19/2021.   Specialty: Cardiology Why: at 2:20pm for your follow up  appt with Dr. Delynn Flavin' [place] information: [place] [id number] [phone]                Discharge Instructions     Amb Referral to Cardiac Rehabilitation   Complete by: As directed    Diagnosis:  Coronary Stents NSTEMI     After initial evaluation and assessments completed: Virtual Based Care may be provided alone or in conjunction with Phase 2 Cardiac Rehab based on patient barriers.: Yes       Discharge Medications   Allergies as of 09/12/2021       Reactions   Onion Diarrhea   Atorvastatin Diarrhea   Capsicum Diarrhea, Nausea Only  Bell peppers        Medication List     STOP taking these medications    ibuprofen 200 MG tablet Commonly known as: ADVIL       TAKE these medications    aspirin 81 MG chewable tablet Chew 1 tablet (81 mg total) by mouth 2 (two) times daily. What changed: when to take this   Brilinta 90 MG Tabs tablet Generic drug: ticagrelor Take 1 tablet (90 mg total) by mouth 2 (two) times daily.   cetirizine 10 MG tablet Commonly known as: ZYRTEC Take 10 mg by mouth as needed for allergies.   ezetimibe 10 MG tablet Commonly known as: ZETIA Take 1 tablet (10 mg total) by mouth daily. Start taking on: September 13, 2021   fluticasone 50 MCG/ACT nasal spray Commonly known as: FLONASE Place 1 spray into both nostrils daily as needed for allergies.   glucose blood test strip 1 each by Other route as needed for other. Use as instructed One Touch Verio (Blood Glucose Test) One Touch Delica Lancets 71I   isosorbide mononitrate 30 MG 24 hr tablet Commonly known as: IMDUR Take 1 tablet (30 mg total) by mouth daily. What changed: when to take this   lisinopril 10 MG tablet Commonly known as: ZESTRIL Take 10 mg by mouth daily.   loratadine 10 MG tablet Commonly known as: CLARITIN Take 10 mg by mouth daily as needed for allergies.   meclizine 25 MG tablet Commonly known as: ANTIVERT Take  1 tablet by mouth as needed for dizziness.   metoprolol succinate 25 MG 24 hr tablet Commonly known as: Toprol XL Take 0.5 tablets (12.5 mg total) by mouth daily.   multivitamin with minerals tablet Take 1 tablet by mouth daily.   nitroGLYCERIN 0.4 MG SL tablet Commonly known as: NITROSTAT Place 1 tablet (0.4 mg total) under the tongue every 5 (five) minutes as needed for chest pain.   NovoLIN 70/30 Kwikpen (70-30) 100 UNIT/ML KwikPen Generic drug: insulin isophane & regular human KwikPen Inject 35 Units into the skin 2 (two) times daily.   PriLOSEC OTC 20 MG tablet Generic drug: omeprazole Take 1 tablet by mouth daily as needed (for acid reflux).   venlafaxine XR 75 MG 24 hr capsule Commonly known as: EFFEXOR-XR Take 75 mg by mouth every evening.   Xigduo XR 06-998 MG Tb24 Generic drug: Dapagliflozin-metFORMIN HCl ER Take 1 tablet by mouth 2 (two) times daily.         Outstanding Labs/Studies   N/a   Duration of Discharge Encounter   Greater than 30 minutes including physician time.  Signed, Reino Bellis, NP 09/12/2021, 1:22 PM   ATTENDING ATTESTATION:  After conducting a review of all available clinical information with the care team, interviewing the patient, and performing a physical exam, I agree with the findings and plan described in this note.   GEN: No acute distress.   HEENT:  MMM, no JVD, no scleral icterus Cardiac: RRR, no murmurs, rubs, or gallops.  Respiratory: Clear to auscultation bilaterally. GI: Soft, nontender, non-distended  MS: No edema; No deformity. Neuro:  Nonfocal  Vasc: Bandage over right radial site  The patient is doing well after PCI of his proximal to mid left anterior descending artery.  Continue dual antiplatelet therapy for 1 year, referral to lipid clinic due to statin intolerances, and close follow-up.  Lenna Sciara, MD Pager [phone]

## 2021-09-12 NOTE — Progress Notes (Signed)
  Echocardiogram 2D Echocardiogram has been performed.  Michael Sandoval 09/12/2021, 12:45 PM

## 2021-09-12 NOTE — Progress Notes (Signed)
CARDIAC REHAB PHASE I   PRE:  Rate/Rhythm: 76 SR  BP:  Sitting: 128/80      SaO2: 99 RA  MODE:  Ambulation: 400 ft   POST:  Rate/Rhythm: 100 ST  BP:  Sitting: 116/76    SaO2: 98 RA  Pt ambulated 440f in hallway independently with steady gait. Pt denies CP, SOB, or dizziness. Pt educated on importance of [place]SA and [place] Pt given heart healthy and diabetic diets. Reviewed site care, restrictions, and exercise guidelines. Will refer to CRP II [place] at pts request.  [phone] Falco RN BSN 09/12/2021 10:11 AM

## 2021-09-13 LAB — LIPOPROTEIN A (LPA): Lipoprotein (a): 8.4 nmol/L (ref <75.0)

## 2021-09-14 ENCOUNTER — Other Ambulatory Visit (HOSPITAL_COMMUNITY): Payer: 59

## 2021-09-14 ENCOUNTER — Ambulatory Visit (HOSPITAL_COMMUNITY): Payer: 59

## 2021-09-14 ENCOUNTER — Ambulatory Visit: Payer: 59

## 2021-09-19 ENCOUNTER — Ambulatory Visit (INDEPENDENT_AMBULATORY_CARE_PROVIDER_SITE_OTHER): Payer: 59 | Admitting: Physician Assistant

## 2021-09-19 ENCOUNTER — Encounter: Payer: Self-pay | Admitting: Physician Assistant

## 2021-09-19 VITALS — BP 122/70 | HR 89 | Ht 69.0 in | Wt 265.0 lb

## 2021-09-19 DIAGNOSIS — E785 Hyperlipidemia, unspecified: Secondary | ICD-10-CM

## 2021-09-19 DIAGNOSIS — I10 Essential (primary) hypertension: Secondary | ICD-10-CM

## 2021-09-19 DIAGNOSIS — E1169 Type 2 diabetes mellitus with other specified complication: Secondary | ICD-10-CM | POA: Diagnosis not present

## 2021-09-19 DIAGNOSIS — R0609 Other forms of dyspnea: Secondary | ICD-10-CM

## 2021-09-19 DIAGNOSIS — I25110 Atherosclerotic heart disease of native coronary artery with unstable angina pectoris: Secondary | ICD-10-CM

## 2021-09-19 DIAGNOSIS — I214 Non-ST elevation (NSTEMI) myocardial infarction: Secondary | ICD-10-CM

## 2021-09-19 MED ORDER — FUROSEMIDE 20 MG PO TABS: 20.0000 mg | ORAL_TABLET | Freq: Every day | ORAL | 1 refills | Status: DC | PRN

## 2021-09-19 NOTE — Progress Notes (Signed)
Cardiology Office Note:    Date:  09/19/2021   ID:  Michael Sandoval, DOB 01-17-1966, MRN [id number]  PCP:  London Pepper, MD  [facility] Cardiologist:  Early Osmond, MD  [facility] Electrophysiologist:  None   Chief Complaint: hospital follow up   History of Present Illness:    Michael Sandoval is a 56 y.o. male with a hx of  insulin-dependent type 2 diabetes, hypertension, hyperlipidemia and CAD seen for hospital follow up.   He had an NSTEMI last week at [facility] where a cardiac cath demonstrated significant 85 to 90% stenosis of the proximal-mid LAD and 85% stenosis of a small circumflex marginal.  He was discharged for follow-up at [facility]. He saw Dr Darcey Nora of CT surgery on 09/10/21 where it was felt His LAD is the culprit vessel with a somewhat long 85 to 90% stenosis.  It does not appear to involve a diagonal branch.  The circumflex vessel with significant stenosis is very small and nongraftable.  The right coronary was normal.  EF is normal by cath. Hence PCI would be better. He was supposed to follow up as outpatient however has been having intermittent chest pain since this afternoon requiring 7 nitro tablets and presented to ER. Hs-troponin 78. Underwent cardiac catheterization with long 85 to 90% LAD stenosis treated with PCI/DES x1. Echo with LVEF of 55-60% and grade I DD.   Patient is here for follow-up.  He continues to have 3 out of 10 chest pressure and shortness of breath.  No improvement despite drinking caffeinated products with Brilinta.  He had breathing problems after Brilinta load in hospital which was resolved with caffeine drink.  His shortness of breath get worse with activity. Noted LE edema on exam. Denies high salt diet.   Past Medical History:  Diagnosis Date   Anxiety    Arthritis    Cancer ([place])    melanoma skin cancer    Daytime somnolence    Decreased hearing    Depression    Diabetes mellitus, type II ([place])        Dyslipidemia     Elevated blood pressure reading    Elevated coronary artery calcium score    Excessive daytime sleepiness    GERD (gastroesophageal reflux disease)    HTN (hypertension)    HTN (hypertension)    Hyperlipidemia    Lightheadedness    Non-restorative sleep    Obesity    Osteoarthritis of hip    Pneumonia    1990    Sinusitis    Unspecified disorder of nose and nasal sinuses     Past Surgical History:  Procedure Laterality Date   colonoscopy   2006   CORONARY STENT INTERVENTION N/A 09/11/2021   Procedure: CORONARY STENT INTERVENTION;  Surgeon: Troy Sine, MD;  Location: [place] [facility];  Service: Cardiovascular;  Laterality: N/A;   HAND SURGERY     crush injury , small screw in place per surgeon    INTRAVASCULAR ULTRASOUND/IVUS N/A 09/11/2021   Procedure: Intravascular Ultrasound/IVUS;  Surgeon: Troy Sine, MD;  Location: [place] [facility];  Service: Cardiovascular;  Laterality: N/A;   skin cancer   several years ago   [place] Right 07/09/2018   Procedure: TOTAL HIP ARTHROPLASTY ANTERIOR APPROACH;  Surgeon: Rod Can, MD;  Location: [facility];  Service: Orthopedics;  Laterality: Right;    Current Medications: Current Meds  Medication Sig  aspirin EC 81 MG tablet Take 81 mg by mouth daily. Swallow whole.   cetirizine (ZYRTEC) 10 MG tablet Take 10 mg by mouth as needed for allergies.   ezetimibe (ZETIA) 10 MG tablet Take 1 tablet (10 mg total) by mouth daily.   fluticasone (FLONASE) 50 MCG/ACT nasal spray Place 1 spray into both nostrils daily as needed for allergies.   furosemide (LASIX) 20 MG tablet Take 1 tablet (20 mg total) by mouth daily as needed (DYSPNEA).   glucose blood test strip 1 each by Other route as needed for other. Use as instructed One Touch Verio (Blood Glucose Test) One Touch Delica Lancets 89Q   isosorbide mononitrate (IMDUR) 30 MG 24 hr tablet Take 1 tablet (30 mg total) by mouth daily.  (Patient taking differently: Take 30 mg by mouth at bedtime.)   lisinopril (PRINIVIL,ZESTRIL) 10 MG tablet Take 10 mg by mouth daily.   loratadine (CLARITIN) 10 MG tablet Take 10 mg by mouth daily as needed for allergies.   meclizine (ANTIVERT) 25 MG tablet Take 1 tablet by mouth as needed for dizziness.   metoprolol succinate (TOPROL XL) 25 MG 24 hr tablet Take 0.5 tablets (12.5 mg total) by mouth daily.   Multiple Vitamins-Minerals (MULTIVITAMIN WITH MINERALS) tablet Take 1 tablet by mouth daily.   nitroGLYCERIN (NITROSTAT) 0.4 MG SL tablet Place 1 tablet (0.4 mg total) under the tongue every 5 (five) minutes as needed for chest pain.   NOVOLIN 70/30 KWIKPEN (70-30) 100 UNIT/ML KwikPen Inject 35 Units into the skin 2 (two) times daily.   omeprazole (PRILOSEC OTC) 20 MG tablet Take 1 tablet by mouth daily as needed (for acid reflux).   ticagrelor (BRILINTA) 90 MG TABS tablet Take 1 tablet (90 mg total) by mouth 2 (two) times daily.   venlafaxine XR (EFFEXOR-XR) 75 MG 24 hr capsule Take 75 mg by mouth every evening.   XIGDUO XR 06-998 MG TB24 Take 1 tablet by mouth 2 (two) times daily.     Allergies:   Onion, Atorvastatin, Capsicum, and Lactose intolerance (gi)   Social History   Socioeconomic History   Marital status: Single    Spouse name: Not on file   Number of children: Not on file   Years of education: Not on file   Highest education level: Not on file  Occupational History   Not on file  Tobacco Use   Smoking status: Never   Smokeless tobacco: Never  Vaping Use   Vaping Use: Never used  Substance and Sexual Activity   Alcohol use: Yes    Comment: rare   Drug use: No   Sexual activity: Not on file  Other Topics Concern   Not on file  Social History Narrative   Works Hotel manager support.     Right handed    Social Determinants of Health   Financial Resource Strain: Not on file  Food Insecurity: Not on file  Transportation Needs: Not on file  Physical Activity: Not on  file  Stress: Not on file  Social Connections: Not on file     Family History: The patient's family history includes Heart attack (age of onset: 50) in his mother; Hypertension in his father.    ROS:   Please see the history of present illness.    All other systems reviewed and are negative.   EKGs/Labs/Other Studies Reviewed:    The following studies were reviewed today:  Echo 09/12/21  1. Left ventricular ejection fraction, by estimation, is 55 to 60%.  The  left ventricle has normal function. The left ventricle has no regional  wall motion abnormalities. There is mild left ventricular hypertrophy.  Left ventricular diastolic parameters  are consistent with Grade I diastolic dysfunction (impaired relaxation).   2. Right ventricular systolic function is normal. The right ventricular  size is normal. Tricuspid regurgitation signal is inadequate for assessing  PA pressure.   3. The mitral valve is normal in structure. No evidence of mitral valve  regurgitation. No evidence of mitral stenosis.   4. The aortic valve is tricuspid. Aortic valve regurgitation is not  visualized. No aortic stenosis is present.   5. The inferior vena cava is normal in size with <50% respiratory  variability, suggesting right atrial pressure of 8 mmHg.   Cath: 09/11/21     2nd Mrg lesion is 85% stenosed.   1st Mrg lesion is 20% stenosed.   Mid LAD lesion is 90% stenosed.   A drug-eluting stent was successfully placed.   Post intervention, there is a 0% residual stenosis.   The patient had undergone prior diagnostic catheterization at [facility]) on September 04, 2021.  CD images were reviewed and revealed a long 85 to 90% LAD stenosis in the proximal to mid LAD but not including the large diagonal vessel and he had minimal narrowing in the high marginal vessel with focal 85% stenosis in a very small second marginal branch of the circumflex.  He had a normal dominant RCA. Subsequent surgical  consultation with Dr. Nils Pyle was done yesterday who felt the patient would be best treated with PCI.  He was admitted last evening with recurrent chest pain and presents now for PCI to his LAD   Intravascular ultrasound showed mild to moderate calcification with the LAD vessel size at 3.75 mm.   PCI was performed with predilatation with a 2.0 x 20 mm balloon, Score-flex 2.5 x 15 mm cutting balloon with DES stenting with a 3.5 x 24 mm Synergy stent postdilated to 3.75 mm with the stenoses being reduced to 0% and brisk TIMI-3 flow.   RECOMMENDATION: Continue DAPT with aspirin/Brilinta for minimum of 12 months.  Initiate a retrial of lipid-lowering therapy with the patient's history of previous diarrhea secondary to atorvastatin.  Optimal blood pressure control with target blood pressure less than 130/80 and optimal diabetic management.   Diagnostic Dominance: Right  Intervention     EKG:  EKG is  ordered today.  The ekg ordered today demonstrates NSR  Recent Labs: 01/24/2021: B Natriuretic Peptide 19.2; Magnesium 2.0 08/20/2021: ALT 38 09/12/2021: BUN 17; Creatinine, Ser 0.96; Hemoglobin 12.9; Platelets 208; Potassium 3.7; Sodium 137  Recent Lipid Panel    Component Value Date/Time   CHOL 231 (H) 08/20/2021 1043   TRIG 232 (H) 08/20/2021 1043   HDL 48 08/20/2021 1043   CHOLHDL 4.8 08/20/2021 1043   LDLCALC 141 (H) 08/20/2021 1043   Physical Exam:    VS:  BP 122/70   Pulse 89   Ht '5\' 9"'$  (1.753 m)   Wt 265 lb (120.2 kg)   SpO2 97%   BMI 39.13 kg/m     Wt Readings from Last 3 Encounters:  09/19/21 265 lb (120.2 kg)  09/10/21 270 lb (122.5 kg)  09/10/21 270 lb (122.5 kg)     GEN:  Well nourished, well developed in no acute distress HEENT: Normal NECK: No JVD; No carotid bruits LYMPHATICS: No lymphadenopathy CARDIAC: RRR, no murmurs, rubs, gallops RESPIRATORY:  Clear to auscultation without rales, wheezing  or rhonchi  ABDOMEN: Soft, non-tender,  non-distended MUSCULOSKELETAL:  trace to 1 + edema; No deformity  SKIN: Warm and dry NEUROLOGIC:  Alert and oriented x 3 PSYCHIATRIC:  Normal affect   ASSESSMENT AND PLAN:    Chest pressure and dyspnea - No improvement despite PCI. Has persistent chest pressure and dyspnea. However, with activity his dyspnea get worse. ? His symptoms due to angina, side effect of brilinta or volume overload (noted LE edema on exam). Trial of lasix, if does not work he will increase Imdur. If does not work, may need to stop [employer]. Close follow up with Dr. Ali Lowe. His EF was normal by echo.   2. CAD s/p DES to LAD - Continue DAPT - Continue BB and Imdur  3. HLD - Pending lipid clinic evaluation   4. HTN - BP stable   Medication Adjustments/Labs and Tests Ordered: Current medicines are reviewed at length with the patient today.  Concerns regarding medicines are outlined above.  Orders Placed This Encounter  Procedures   EKG 12-Lead   Meds ordered this encounter  Medications   furosemide (LASIX) 20 MG tablet    Sig: Take 1 tablet (20 mg total) by mouth daily as needed (DYSPNEA).    Dispense:  30 tablet    Refill:  1    Patient Instructions  Medication Instructions:  START Lasix '20mg'$  take 1 tablet daily for 3 days then as needed for dyspnea IF THE LASIX DOES NOT WORK INCREASE IMDUR TO '60MG'$  TAKE 1 TABLET ONCE A DAY  *If you need a refill on your cardiac medications before your next appointment, please call your pharmacy*   Lab Work: NONE ORDERED   Testing/Procedures: NONE ORDERED   Follow-Up: At [facility], you and your health needs are our priority.  As part of our continuing mission to provide you with exceptional heart care, we have created designated Provider Care Teams.  These Care Teams include your primary Cardiologist (physician) and Advanced Practice Providers (APPs -  Physician Assistants and Nurse Practitioners) who all work together to provide you with the care you  need, when you need it.  We recommend signing up for the patient portal called "MyChart".  Sign up information is provided on this After Visit Summary.  MyChart is used to connect with patients for Virtual Visits (Telemedicine).  Patients are able to view lab/test results, encounter notes, upcoming appointments, etc.  Non-urgent messages can be sent to your provider as well.   To learn more about what you can do with MyChart, go to [email or web address].    Your next appointment:   1 week(s)  The format for your next appointment:   In Person  Provider:   Early Osmond, MD     Other Instructions   Important Information About Sugar         Signed, Leanor Kail, [place]  09/19/2021 2:59 PM    [place]

## 2021-09-19 NOTE — Patient Instructions (Signed)
Medication Instructions:  START Lasix '20mg'$  take 1 tablet daily for 3 days then as needed for dyspnea IF THE LASIX DOES NOT WORK INCREASE IMDUR TO '60MG'$  TAKE 1 TABLET ONCE A DAY  *If you need a refill on your cardiac medications before your next appointment, please call your pharmacy*   Lab Work: NONE ORDERED   Testing/Procedures: NONE ORDERED   Follow-Up: At [facility], you and your health needs are our priority.  As part of our continuing mission to provide you with exceptional heart care, we have created designated Provider Care Teams.  These Care Teams include your primary Cardiologist (physician) and Advanced Practice Providers (APPs -  Physician Assistants and Nurse Practitioners) who all work together to provide you with the care you need, when you need it.  We recommend signing up for the patient portal called "MyChart".  Sign up information is provided on this After Visit Summary.  MyChart is used to connect with patients for Virtual Visits (Telemedicine).  Patients are able to view lab/test results, encounter notes, upcoming appointments, etc.  Non-urgent messages can be sent to your provider as well.   To learn more about what you can do with MyChart, go to [email or web address].    Your next appointment:   1 week(s)  The format for your next appointment:   In Person  Provider:   Early Osmond, MD     Other Instructions   Important Information About Sugar

## 2021-09-24 ENCOUNTER — Encounter: Payer: Self-pay | Admitting: Internal Medicine

## 2021-09-25 NOTE — Progress Notes (Signed)
Cardiology Office Note:    Date:  09/28/2021   ID:  Michael Sandoval, DOB 1965-11-17, MRN [id number]  PCP:  Michael Pepper, MD   [facility] Cardiologist:  Lenna Sciara, MD Referring MD: Michael Pepper, MD   Chief Complaint/Reason for Referral: Chest pain  ASSESSMENT:    1. Dyspnea, unspecified type   2. Type 2 diabetes mellitus with complication, with long-term current use of insulin ([place])   3. Hypertension associated with diabetes ([place])   4. Hyperlipidemia associated with type 2 diabetes mellitus ([place])   5. BMI 40.0-44.9, adult ([place])   6. Daytime somnolence      PLAN:    In order of problems listed above: 1.  Dyspnea: We will convert to Plavix to see if this will help.  We will Plavix load him and convert to 75 mg daily.  Lasix and Imdur have not helped his symptoms.  We will stop his Lasix.  I do not think that his chest pain at rest is due to his small vessel disease so we will stop Imdur.  I will refer him for cardiopulmonary exercise stress test.  If this is abnormal from a pulmonary standpoint I will refer him to pulmonology for further recommendations.  I will refer him to cardiac rehabilitation in case this represents deconditioning.  The other possibility is this is some sort of long COVID sequelae.    Given his significant disability I will approve short-term disability for 1 month in order to obtain this testing and then I have advised him to go back to work on light duty.  I have completed his FMLA paperwork.  Follow-up in 3 months or earlier if needed.  2.  Type 2 diabetes: Continue aspirin and lisinopril; I spoke to patient about SGLT2 inhibitor.  He should talk to PCP about this at the next time we see him we will address this.  Given his intolerance of multiple statins we will refer to pharmacy for further management. 3.  Hypertension: Continue lisinopril with goal blood pressure less than 130/80 mmHg.  His blood pressure is well controlled today. 4.   Hyperlipidemia: Patient is intolerant of atorvastatin and he tells me other statins have not been tolerated.  Will trial Crestor '10mg'$  .  Will refer to pharmacy for further management.  LP(a) was low.   5.  Elevated BMI: We will refer to pharmacy for further recommendations.  The patient will be obtaining a sleep apnea evaluation as well. 6.  Daytime somnolence: We will refer for sleep study.     Cardiac Rehabilitation Eligibility Assessment  The patient is ready to start cardiac rehabilitation from a cardiac standpoint.           Dispo:  Return in about 3 months (around 12/29/2021).      Medication Adjustments/Labs and Tests Ordered: Current medicines are reviewed at length with the patient today.  Concerns regarding medicines are outlined above.  The following changes have been made:     Labs/tests ordered: Orders Placed This Encounter  Procedures   Cardiopulmonary exercise test   AMB Referral to Heartcare Pharm-D   Split night study    Medication Changes: Meds ordered this encounter  Medications   rosuvastatin (CRESTOR) 10 MG tablet    Sig: Take 1 tablet (10 mg total) by mouth daily.    Dispense:  90 tablet    Refill:  3   clopidogrel (PLAVIX) 75 MG tablet    Sig: Take 6 tablets x 1 day, then take  ONE TABLET (75 mg) DAILY    Dispense:  96 tablet    Refill:  3    Needs load of 600 mg on day 1, then 75 mg daily.  Stop Brilinta     Current medicines are reviewed at length with the patient today.  The patient does not have concerns regarding medicines.   History of Present Illness:    FOCUSED PROBLEM LIST:   1.  Acute coronary syndrome status post PCI of the proximal to mid left anterior descending artery July 2023; with 85% stenosis of small distal obtuse marginal  2.  BMI of 40 3.  Hypertension 4.  Hyperlipidemia: LP(a) of 8.4 5.  Type 2 diabetes on insulin 6.  COVID infection 2021 and 2022  July 2023: Seen for initial consultation regarding precordial chest  pain.  He was referred for coronary CTA.  Today: Unfortunately before he could get his coronary CTA he presented to [facility] with chest pain syndrome.  He underwent cardiac catheterization which demonstrated multivessel disease and was referred to CABG.  He was seen by Dr. Darcey Sandoval who believe that he would be best served by PCI of his LAD.  He was to see me regarding further management however he presented to [facility] with worsening chest pain and mildly elevated troponin.  He underwent PCI of his proximal to mid LAD which was uncomplicated.  He was seen in follow-up with complaints of 3 out of 10 chest pressure and shortness of breath.  This shortness of breath seemed to be worse after starting Brilinta.  The patient tells me that he is short of breath at rest.  This is exacerbated by exertion.  He also feels chest heaviness at rest.  Sometimes when he is laying down he feels like there is a weight on his chest.  He denies any wheezing.  He has had no lightheadedness or blacking out.  Sometimes when he looks up he will feel dizzy.  He has required no emergency room visits or hospitalizations.  Unfortunately a trial of Lasix has not helped.  Increasing Imdur to 30 mg twice a day has not helped.  Current Medications: Current Meds  Medication Sig   aspirin EC 81 MG tablet Take 81 mg by mouth daily. Swallow whole.   cetirizine (ZYRTEC) 10 MG tablet Take 10 mg by mouth as needed for allergies.   clopidogrel (PLAVIX) 75 MG tablet Take 6 tablets x 1 day, then take ONE TABLET (75 mg) DAILY   ezetimibe (ZETIA) 10 MG tablet Take 1 tablet (10 mg total) by mouth daily.   fluticasone (FLONASE) 50 MCG/ACT nasal spray Place 1 spray into both nostrils daily as needed for allergies.   lisinopril (PRINIVIL,ZESTRIL) 10 MG tablet Take 10 mg by mouth daily.   loratadine (CLARITIN) 10 MG tablet Take 10 mg by mouth daily as needed for allergies.   meclizine (ANTIVERT) 25 MG tablet Take 1 tablet by mouth as needed  for dizziness.   metoprolol succinate (TOPROL XL) 25 MG 24 hr tablet Take 0.5 tablets (12.5 mg total) by mouth daily.   Multiple Vitamins-Minerals (MULTIVITAMIN WITH MINERALS) tablet Take 1 tablet by mouth daily.   nitroGLYCERIN (NITROSTAT) 0.4 MG SL tablet Place 1 tablet (0.4 mg total) under the tongue every 5 (five) minutes as needed for chest pain.   NOVOLIN 70/30 KWIKPEN (70-30) 100 UNIT/ML KwikPen Inject 35 Units into the skin 2 (two) times daily.   omeprazole (PRILOSEC OTC) 20 MG tablet Take 1 tablet by mouth  daily as needed (for acid reflux).   rosuvastatin (CRESTOR) 10 MG tablet Take 1 tablet (10 mg total) by mouth daily.   venlafaxine XR (EFFEXOR-XR) 75 MG 24 hr capsule Take 75 mg by mouth every evening.   XIGDUO XR 06-998 MG TB24 Take 1 tablet by mouth 2 (two) times daily.   [DISCONTINUED] isosorbide mononitrate (IMDUR) 30 MG 24 hr tablet Take 1 tablet (30 mg total) by mouth daily. (Patient taking differently: Take 30 mg by mouth daily. Take one tablet by mouth twice daily)   [DISCONTINUED] ticagrelor (BRILINTA) 90 MG TABS tablet Take 1 tablet (90 mg total) by mouth 2 (two) times daily.     Allergies:    Onion, Atorvastatin, Capsicum, and Lactose intolerance (gi)   Social History:   Social History   Tobacco Use   Smoking status: Never   Smokeless tobacco: Never  Vaping Use   Vaping Use: Never used  Substance Use Topics   Alcohol use: Yes    Comment: rare   Drug use: No     Family Hx: Family History  Problem Relation Age of Onset   Heart attack Mother 41   Hypertension Father      Review of Systems:   Please see the history of present illness.    All other systems reviewed and are negative.     EKGs/Labs/Other Test Reviewed:    EKG:  EKG performed 2023 that I personally reviewed demonstrates sinus rhythm with low voltage  Prior CV studies:  Cath 2023 with PCI of proximal to mid LAD with 85% distal small left circumflex lesion  TTE 2023 ejection fraction of  55 to 60% with grade 1 diastolic dysfunction and mild left ventricular hypertrophy with no significant valvular abnormalities  ETT 2017 low risk  Other studies Reviewed: Review of the additional studies/records demonstrates: None relevant  Recent Labs: 01/24/2021: B Natriuretic Peptide 19.2; Magnesium 2.0 08/20/2021: ALT 38 09/12/2021: BUN 17; Creatinine, Ser 0.96; Hemoglobin 12.9; Platelets 208; Potassium 3.7; Sodium 137   Recent Lipid Panel Lab Results  Component Value Date/Time   CHOL 231 (H) 08/20/2021 10:43 AM   TRIG 232 (H) 08/20/2021 10:43 AM   HDL 48 08/20/2021 10:43 AM   LDLCALC 141 (H) 08/20/2021 10:43 AM    Risk Assessment/Calculations:          Physical Exam:    VS:  BP 122/80   Pulse 100   Ht '5\' 9"'$  (1.753 m)   Wt 268 lb (121.6 kg)   BMI 39.58 kg/m    Wt Readings from Last 3 Encounters:  09/28/21 268 lb (121.6 kg)  09/19/21 265 lb (120.2 kg)  09/10/21 270 lb (122.5 kg)    GENERAL:  No apparent distress, AOx3 HEENT:  No carotid bruits, +2 carotid impulses, no scleral icterus CAR: RRR no murmurs, gallops, rubs, or thrills RES:  Clear to auscultation bilaterally ABD:  Soft, nontender, nondistended, positive bowel sounds x 4 VASC:  +2 radial pulses, +2 carotid pulses, palpable pedal pulses NEURO:  CN 2-12 grossly intact; motor and sensory grossly intact PSYCH:  No active depression or anxiety EXT:  No edema, ecchymosis, or cyanosis  Signed, Early Osmond, MD  09/28/2021 1:34 PM    [facility] [address], [place], [place]  [address] Phone: [phone]; Fax: [phone]   Note:  This document was prepared using Dragon voice recognition software and may include unintentional dictation errors.

## 2021-09-28 ENCOUNTER — Ambulatory Visit (INDEPENDENT_AMBULATORY_CARE_PROVIDER_SITE_OTHER): Payer: 59 | Admitting: Internal Medicine

## 2021-09-28 ENCOUNTER — Encounter: Payer: Self-pay | Admitting: Internal Medicine

## 2021-09-28 VITALS — BP 122/80 | HR 100 | Ht 69.0 in | Wt 268.0 lb

## 2021-09-28 DIAGNOSIS — E118 Type 2 diabetes mellitus with unspecified complications: Secondary | ICD-10-CM

## 2021-09-28 DIAGNOSIS — R400 Somnolence: Secondary | ICD-10-CM

## 2021-09-28 DIAGNOSIS — Z6841 Body Mass Index (BMI) 40.0 and over, adult: Secondary | ICD-10-CM

## 2021-09-28 DIAGNOSIS — E1169 Type 2 diabetes mellitus with other specified complication: Secondary | ICD-10-CM | POA: Diagnosis not present

## 2021-09-28 DIAGNOSIS — E1159 Type 2 diabetes mellitus with other circulatory complications: Secondary | ICD-10-CM

## 2021-09-28 DIAGNOSIS — I152 Hypertension secondary to endocrine disorders: Secondary | ICD-10-CM

## 2021-09-28 DIAGNOSIS — R0600 Dyspnea, unspecified: Secondary | ICD-10-CM

## 2021-09-28 DIAGNOSIS — E785 Hyperlipidemia, unspecified: Secondary | ICD-10-CM

## 2021-09-28 DIAGNOSIS — Z794 Long term (current) use of insulin: Secondary | ICD-10-CM

## 2021-09-28 DIAGNOSIS — R079 Chest pain, unspecified: Secondary | ICD-10-CM

## 2021-09-28 MED ORDER — CLOPIDOGREL BISULFATE 75 MG PO TABS: ORAL_TABLET | ORAL | 3 refills | Status: DC

## 2021-09-28 MED ORDER — ROSUVASTATIN CALCIUM 10 MG PO TABS: 10.0000 mg | ORAL_TABLET | Freq: Every day | ORAL | 3 refills | Status: DC

## 2021-09-28 NOTE — Patient Instructions (Signed)
Medication Instructions:  Your physician has recommended you make the following change in your medication:  1.) stop furosemide (Lasix) 2.) stop isosorbide (Imdur) 3.) stop Brilinta 4.) start rosuvastatin (Crestor)10 mg - take one tablet daily 5.) start clopidogrel (Plavix) 75 mg --take SIX TABLETS ON DAY ONE, then take ONE TABLET DAILY  *If you need a refill on your cardiac medications before your next appointment, please call your pharmacy*   Lab Work: none   Testing/Procedures: Your physician has recommended that you have a sleep study. This test records several body functions during sleep, including: brain activity, eye movement, oxygen and carbon dioxide blood levels, heart rate and rhythm, breathing rate and rhythm, the flow of air through your mouth and nose, snoring, body muscle movements, and chest and belly movement.  Your physician has recommended that you have a cardiopulmonary stress test (CPX). CPX testing is a non-invasive measurement of heart and lung function. It replaces a traditional treadmill stress test. This type of test provides a tremendous amount of information that relates not only to your present condition but also for future outcomes. This test combines measurements of you ventilation, respiratory gas exchange in the lungs, electrocardiogram (EKG), blood pressure and physical response before, during, and following an exercise protocol.   Follow-Up: At [facility], you and your health needs are our priority.  As part of our continuing mission to provide you with exceptional heart care, we have created designated Provider Care Teams.  These Care Teams include your primary Cardiologist (physician) and Advanced Practice Providers (APPs -  Physician Assistants and Nurse Practitioners) who all work together to provide you with the care you need, when you need it.  Your next appointment:   3 month(s)  The format for your next appointment:   In Person  Provider:    Early Osmond, MD     Other Instructions You have been referred to [facility] for lipid management/intolerance to Lipitor   Important Information About Sugar

## 2021-10-01 ENCOUNTER — Telehealth: Payer: Self-pay | Admitting: Internal Medicine

## 2021-10-01 NOTE — Telephone Encounter (Signed)
Patient called to follow-up on disability paperwork being faxed from his insurance company.  Patient stated he will also bring a hard copy in to the clinic to have this documentation completed.

## 2021-10-03 ENCOUNTER — Ambulatory Visit (HOSPITAL_COMMUNITY): Payer: 59 | Attending: Internal Medicine

## 2021-10-03 DIAGNOSIS — E1159 Type 2 diabetes mellitus with other circulatory complications: Secondary | ICD-10-CM | POA: Diagnosis not present

## 2021-10-03 DIAGNOSIS — Z6841 Body Mass Index (BMI) 40.0 and over, adult: Secondary | ICD-10-CM | POA: Diagnosis not present

## 2021-10-03 DIAGNOSIS — I152 Hypertension secondary to endocrine disorders: Secondary | ICD-10-CM | POA: Insufficient documentation

## 2021-10-03 DIAGNOSIS — R0600 Dyspnea, unspecified: Secondary | ICD-10-CM | POA: Diagnosis not present

## 2021-10-03 DIAGNOSIS — E1169 Type 2 diabetes mellitus with other specified complication: Secondary | ICD-10-CM | POA: Insufficient documentation

## 2021-10-03 DIAGNOSIS — E118 Type 2 diabetes mellitus with unspecified complications: Secondary | ICD-10-CM | POA: Diagnosis present

## 2021-10-03 DIAGNOSIS — Z794 Long term (current) use of insulin: Secondary | ICD-10-CM | POA: Insufficient documentation

## 2021-10-03 DIAGNOSIS — E785 Hyperlipidemia, unspecified: Secondary | ICD-10-CM | POA: Insufficient documentation

## 2021-10-03 DIAGNOSIS — R400 Somnolence: Secondary | ICD-10-CM

## 2021-10-08 ENCOUNTER — Other Ambulatory Visit (HOSPITAL_COMMUNITY): Payer: Self-pay

## 2021-10-19 ENCOUNTER — Encounter: Payer: Self-pay | Admitting: Internal Medicine

## 2021-10-23 ENCOUNTER — Other Ambulatory Visit: Payer: Self-pay

## 2021-10-23 MED ORDER — EZETIMIBE 10 MG PO TABS: 10.0000 mg | ORAL_TABLET | Freq: Every day | ORAL | 3 refills | Status: DC

## 2021-10-31 ENCOUNTER — Ambulatory Visit: Payer: 59 | Attending: Cardiology | Admitting: Pharmacist

## 2021-10-31 ENCOUNTER — Encounter: Payer: Self-pay | Admitting: Internal Medicine

## 2021-10-31 DIAGNOSIS — E1169 Type 2 diabetes mellitus with other specified complication: Secondary | ICD-10-CM

## 2021-10-31 DIAGNOSIS — I214 Non-ST elevation (NSTEMI) myocardial infarction: Secondary | ICD-10-CM

## 2021-10-31 DIAGNOSIS — E785 Hyperlipidemia, unspecified: Secondary | ICD-10-CM | POA: Diagnosis not present

## 2021-10-31 NOTE — Progress Notes (Signed)
Patient ID: Michael Sandoval                 DOB: May 04, 1965                    MRN: [id number]      HPI: Michael Sandoval is a 56 y.o. male patient referred to lipid clinic by Dr. Ali Lowe. PMH is significant for DM, obesity, HTN, dyspnea, CAD s/p NSTEMI July 2023. Patient intolerant to atorvastatin. Started on rosuvastatin '10mg'$  daily by Dr. Ali Lowe on 8/18. Referred to lipid clinic.  Patient presents to lipid clinic today. States that he had diarrhea for 3 days after starting rosuvastatin, but since then it has gone away and he is doing fine. Also taking ezetimibe '10mg'$  daily without issue. He states that his SOB might be a little better after switching to plavix, but he still get SOB and dizzy. He needs a return to work note. States he will send a mychart message to Dr. Ali Lowe with the dates. He has not been exercising, but has an elliptical at home that he plans to start using. Says he is on the wait list for cardiac rehab, but thinks he wont do it by the time they get around to him. I encourage him to complete regardless of when its his time.   He has a physical with Dr. Orland Mustard on 10/13. Expects him to do labs. Has an endocrinologist. Will see him Oct 6. Prior to his heart attack, he was not taking his diabetes medications. His diet has been poor. Had not felt like cooking (tired) so he has been eating sandwiches and burgers.  Current Medications: rosuvastatin '10mg'$  daily, ezetimibe '10mg'$  daily Intolerances: atorvastatin Risk Factors: NSTEMI, DM, HTN LDL goal: <55  Diet: sandwich, hamburger Unsweet tea w/ splenda and water, zero sugar soda  Exercise: none currently   Family History:  Family History  Problem Relation Age of Onset   Heart attack Mother 46   Hypertension Father      Social History: no tobacco  Labs:09/12/21 LPa 8.4  09/05/21 TC 195, TG 274, HDL 53, LDL-C 87  08/20/21 TC 231 TG 232 HDL 48, LDL-C 141 (no medications)   Past Medical History:  Diagnosis Date   Anxiety     Arthritis    Cancer ([place])    melanoma skin cancer    Daytime somnolence    Decreased hearing    Depression    Diabetes mellitus, type II (HCC)        Dyslipidemia    Elevated blood pressure reading    Elevated coronary artery calcium score    Excessive daytime sleepiness    GERD (gastroesophageal reflux disease)    HTN (hypertension)    HTN (hypertension)    Hyperlipidemia    Lightheadedness    Non-restorative sleep    Obesity    Osteoarthritis of hip    Pneumonia    1990    Sinusitis    Unspecified disorder of nose and nasal sinuses     Current Outpatient Medications on File Prior to Visit  Medication Sig Dispense Refill   aspirin EC 81 MG tablet Take 81 mg by mouth daily. Swallow whole.     cetirizine (ZYRTEC) 10 MG tablet Take 10 mg by mouth as needed for allergies.     clopidogrel (PLAVIX) 75 MG tablet Take 6 tablets x 1 day, then take ONE TABLET (75 mg) DAILY 96 tablet 3   ezetimibe (ZETIA) 10 MG tablet Take 1  tablet (10 mg total) by mouth daily. 90 tablet 3   fluticasone (FLONASE) 50 MCG/ACT nasal spray Place 1 spray into both nostrils daily as needed for allergies.     glucose blood test strip 1 each by Other route as needed for other. Use as instructed One Touch Verio (Blood Glucose Test) One Touch Delica Lancets 76H     lisinopril (PRINIVIL,ZESTRIL) 10 MG tablet Take 10 mg by mouth daily.     loratadine (CLARITIN) 10 MG tablet Take 10 mg by mouth daily as needed for allergies.     meclizine (ANTIVERT) 25 MG tablet Take 1 tablet by mouth as needed for dizziness.     metoprolol succinate (TOPROL XL) 25 MG 24 hr tablet Take 0.5 tablets (12.5 mg total) by mouth daily. 45 tablet 3   Multiple Vitamins-Minerals (MULTIVITAMIN WITH MINERALS) tablet Take 1 tablet by mouth daily.     nitroGLYCERIN (NITROSTAT) 0.4 MG SL tablet Place 1 tablet (0.4 mg total) under the tongue every 5 (five) minutes as needed for chest pain. 25 tablet 11   NOVOLIN 70/30 KWIKPEN (70-30) 100 UNIT/ML  KwikPen Inject 35 Units into the skin 2 (two) times daily.     omeprazole (PRILOSEC OTC) 20 MG tablet Take 1 tablet by mouth daily as needed (for acid reflux).     rosuvastatin (CRESTOR) 10 MG tablet Take 1 tablet (10 mg total) by mouth daily. 90 tablet 3   venlafaxine XR (EFFEXOR-XR) 75 MG 24 hr capsule Take 75 mg by mouth every evening.     XIGDUO XR 06-998 MG TB24 Take 1 tablet by mouth 2 (two) times daily.     No current facility-administered medications on file prior to visit.    Allergies  Allergen Reactions   Onion Diarrhea   Atorvastatin Diarrhea   Capsicum Diarrhea and Nausea Only    Bell peppers   Lactose Intolerance (Gi) Diarrhea    Assessment/Plan:  1. Hyperlipidemia - LDL-C is above goal of <55. Started on rosuvastatin and ezetimibe about a month ago. Will recheck labs at PCP. I have asked patient to request PCP order an ApoB, lipid panel and hepatic panel. We briefly discussed options if LDL is not <55 or ApoB not <60. These include increasing rosuvastatin, adding PCKS9i or Nexletol. Will follow up on labs and call patient to discuss.   We discussed briefly the cardiovascular, blood sugar and weight benefits of GLP-1s. Will defer to endo.   We discussed the 5 modifiable risk factors for CVD (lipids, BP, Tobacco use, diet and exercise) BP is controlled, patient does not use tobacco. Lipids discussed above. I have encouraged patient to eat a diet of real food, ie avoid processed foods (ie most foods with a nutrition label) and watch for added sugars, increase vegetable intake. Limit alcohol and no sugary beverages (soda, sweet tea, juice). I have encouraged patient to increase exercise. Daily routine in the AM. Start small and increase as tolerated.  Thank you,   Ramond Dial, Pharm.D, BCPS, CPP [facility] [address] [place], [place] [address]  Phone: [phone]; Fax: [phone]

## 2021-10-31 NOTE — Patient Instructions (Signed)
Please increase your exercise as tolerated. Increase slowly Please try to increase the amount of vegetables you eat Continue taking rosuvastatin '10mg'$  daily and Zetia '10mg'$  daily I will call you once I review your labs from Dr. Orland Mustard Please ask Dr. Orland Mustard to draw an ApoB, lipid panel and hepatic panel  Please call me at [phone] with any questions

## 2021-11-01 ENCOUNTER — Encounter: Payer: Self-pay | Admitting: Internal Medicine

## 2021-11-20 ENCOUNTER — Encounter: Payer: Self-pay | Admitting: Internal Medicine

## 2021-11-20 ENCOUNTER — Telehealth (HOSPITAL_COMMUNITY): Payer: Self-pay

## 2021-11-20 MED ORDER — EZETIMIBE 10 MG PO TABS: 10.0000 mg | ORAL_TABLET | Freq: Every day | ORAL | 3 refills | Status: DC

## 2021-11-20 NOTE — Telephone Encounter (Signed)
Called and spoke with pt in regards to CR pt stated he has returned to work and is unable to participate at this time.   Closed referral

## 2022-01-05 NOTE — Progress Notes (Signed)
Cardiology Office Note:    Date:  01/07/2022   ID:  Michael Sandoval, DOB [birth year], MRN [id number]  PCP:  London Pepper, MD   [facility] Providers Cardiologist:  Lenna Sciara, MD Referring MD: London Pepper, MD   Chief Complaint/Reason for Referral: Chest pain  ASSESSMENT:    1. Dyspnea, unspecified type   2. Type 2 diabetes mellitus with complication, with long-term current use of insulin ([place])   3. Hypertension associated with diabetes ([place])   4. Hyperlipidemia associated with type 2 diabetes mellitus ([place])   5. BMI 40.0-44.9, adult ([place])   6. Daytime somnolence   7. Coronary artery disease involving native coronary artery of native heart with unstable angina pectoris ([place])       PLAN:    In order of problems listed above: 1.  Dyspnea: Cardiopulmonary exercise stress test was reassuring.  Continue exercise to help with deconditioning. 2.  Type 2 diabetes: Continue aspirin, lisinopril, statin, and Farxiga.  He will discuss with his endocrinologist re: GLP-1 RA. 3.  Hypertension: Blood pressures well controlled on his current regimen. 4.  Hyperlipidemia: LDL goal <55.  Check lipid panel, LFTs today. 5.  Elevated BMI: Continue diet and exercise; follow up with pharmacy 6.  Daytime somnolence: Awaiting sleep apnea study. 7.  Coronary artery disease: Continue dual antiplatelet therapy until July 2024 and then stop aspirin and continue Plavix monotherapy indefinitely.  Refer to cardiac rehab.  He is stable for cardiac rehab.     Cardiac Rehabilitation Eligibility Assessment  The patient is ready to start cardiac rehabilitation from a cardiac standpoint.           Dispo:  Return in about 6 months (around 07/08/2022).      Medication Adjustments/Labs and Tests Ordered: Current medicines are reviewed at length with the patient today.  Concerns regarding medicines are outlined above.  The following changes have been made:     Labs/tests ordered: Orders Placed  This Encounter  Procedures   Lipid Profile   Hepatic function panel   Amb Referral to Cardiac Rehabilitation    Medication Changes: No orders of the defined types were placed in this encounter.    Current medicines are reviewed at length with the patient today.  The patient does not have concerns regarding medicines.   History of Present Illness:    FOCUSED PROBLEM LIST:   1.  Acute coronary syndrome status post PCI of the proximal to mid left anterior descending artery July 2023; with 85% stenosis of small distal obtuse marginal  2.  BMI of 40 3.  Hypertension 4.  Hyperlipidemia: LP(a) of 8.4 5.  Type 2 diabetes on insulin 6.  COVID infection 2021 and 2022  July 2023: Seen for initial consultation regarding precordial chest pain.  He was referred for coronary CTA.  August 2023: Unfortunately before he could get his coronary CTA he presented to [facility] with chest pain syndrome.  He underwent cardiac catheterization which demonstrated multivessel disease and was referred to CABG.  He was seen by Dr. Darcey Nora who believe that he would be best served by PCI of his LAD.  He was to see me regarding further management however he presented to [facility] with worsening chest pain and mildly elevated troponin.  He underwent PCI of his proximal to mid LAD which was uncomplicated.  He was seen in follow-up with complaints of 3 out of 10 chest pressure and shortness of breath.  This shortness of breath seemed to be worse after  starting Brilinta.  The patient tells me that he is short of breath at rest.  This is exacerbated by exertion.  He also feels chest heaviness at rest.  Sometimes when he is laying down he feels like there is a weight on his chest.  He denies any wheezing.  He has had no lightheadedness or blacking out.  Sometimes when he looks up he will feel dizzy.  He has required no emergency room visits or hospitalizations.  Unfortunately a trial of Lasix has not helped.  Increasing  Imdur to 30 mg twice a day has not helped.  Plan:  Given dyspnea, change to plavix, stop lasix and Imdur (as they have not helped), refer for CPEX; start Crestor '10mg'$  qday and refer to pharmacy; obtain sleep study.  Today:  In the interim the patient had a cardiopulmonary exercise stress test which demonstrated no cardiac or pulmonary abnormality and that the patient's symptoms were likely due to deconditioning.  He was referred for a sleep apnea evaluation and those results are not yet back.  He has been doing well.  His PCP increased the dose of an anxiety medication which helped his chest pain.  He denies any exertional angina.  He was also started on Farxiga.  He has had no bleeding episodes, signs or symptoms of stroke, severe shortness of breath, or any other cardiac symptoms.  He continues to work.  He would like to start cardiac rehabilitation in January when his work schedule permits.  He is otherwise well and without complaints.  Current Medications: Current Meds  Medication Sig   aspirin EC 81 MG tablet Take 81 mg by mouth daily. Swallow whole.   cetirizine (ZYRTEC) 10 MG tablet Take 10 mg by mouth as needed for allergies.   clopidogrel (PLAVIX) 75 MG tablet Take 6 tablets x 1 day, then take ONE TABLET (75 mg) DAILY   ezetimibe (ZETIA) 10 MG tablet Take 1 tablet (10 mg total) by mouth daily.   fluticasone (FLONASE) 50 MCG/ACT nasal spray Place 1 spray into both nostrils daily as needed for allergies.   glucose blood test strip 1 each by Other route as needed for other. Use as instructed One Touch Verio (Blood Glucose Test) One Touch Delica Lancets 08M   insulin aspart protamine - aspart (NOVOLOG MIX 70/30 FLEXPEN) (70-30) 100 UNIT/ML FlexPen Inject 35 Units into the skin 2 (two) times daily with a meal.   lisinopril (PRINIVIL,ZESTRIL) 10 MG tablet Take 10 mg by mouth daily.   metoprolol succinate (TOPROL XL) 25 MG 24 hr tablet Take 0.5 tablets (12.5 mg total) by mouth daily.   Multiple  Vitamins-Minerals (MULTIVITAMIN WITH MINERALS) tablet Take 1 tablet by mouth daily.   NOVOLIN 70/30 KWIKPEN (70-30) 100 UNIT/ML KwikPen Inject 35 Units into the skin 2 (two) times daily.   omeprazole (PRILOSEC OTC) 20 MG tablet Take 1 tablet by mouth daily as needed (for acid reflux).   rosuvastatin (CRESTOR) 10 MG tablet Take 1 tablet (10 mg total) by mouth daily.   venlafaxine XR (EFFEXOR-XR) 150 MG 24 hr capsule Take 150 mg by mouth daily.   XIGDUO XR 06-998 MG TB24 Take 1 tablet by mouth 2 (two) times daily.     Allergies:    Onion, Atorvastatin, Capsicum, and Lactose intolerance (gi)   Social History:   Social History   Tobacco Use   Smoking status: Never   Smokeless tobacco: Never  Vaping Use   Vaping Use: Never used  Substance Use Topics  Alcohol use: Yes    Comment: rare   Drug use: No     Family Hx: Family History  Problem Relation Age of Onset   Heart attack Mother 23   Hypertension Father      Review of Systems:   Please see the history of present illness.    All other systems reviewed and are negative.     EKGs/Labs/Other Test Reviewed:    EKG:  EKG performed 2023 that I personally reviewed demonstrates sinus rhythm with low voltage  Prior CV studies:  CPEX 2023: Exercise testing with gas exchange demonstrates mild functional impairment when compared to matched sedentary norms. There is no cardiopulmonary abnormality. Patient appears primarily limited due to body habitus and deconditioning. VE/VCO2 slope is elevated and could indicate increased pulmonary pressures, however there is a hyper-ventilatory component playing a role in that elevated VE/VCO2 slope.   Cath 2023 with PCI of proximal to mid LAD with 85% distal small left circumflex lesion  TTE 2023 ejection fraction of 55 to 60% with grade 1 diastolic dysfunction and mild left ventricular hypertrophy with no significant valvular abnormalities  ETT 2017 low risk  Other studies Reviewed: Review  of the additional studies/records demonstrates: None relevant  Recent Labs: 01/24/2021: B Natriuretic Peptide 19.2; Magnesium 2.0 08/20/2021: ALT 38 09/12/2021: BUN 17; Creatinine, Ser 0.96; Hemoglobin 12.9; Platelets 208; Potassium 3.7; Sodium 137   Recent Lipid Panel Lab Results  Component Value Date/Time   CHOL 231 (H) 08/20/2021 10:43 AM   TRIG 232 (H) 08/20/2021 10:43 AM   HDL 48 08/20/2021 10:43 AM   LDLCALC 141 (H) 08/20/2021 10:43 AM    Risk Assessment/Calculations:          Physical Exam:    VS:  BP 108/62   Pulse 92   Ht '5\' 9"'$  (1.753 m)   Wt 270 lb 6.4 oz (122.7 kg)   SpO2 97%   BMI 39.93 kg/m    Wt Readings from Last 3 Encounters:  01/07/22 270 lb 6.4 oz (122.7 kg)  09/28/21 268 lb (121.6 kg)  09/19/21 265 lb (120.2 kg)    GENERAL:  No apparent distress, AOx3 HEENT:  No carotid bruits, +2 carotid impulses, no scleral icterus CAR: RRR no murmurs, gallops, rubs, or thrills RES:  Clear to auscultation bilaterally ABD:  Soft, nontender, nondistended, positive bowel sounds x 4 VASC:  +2 radial pulses, +2 carotid pulses, palpable pedal pulses NEURO:  CN 2-12 grossly intact; motor and sensory grossly intact PSYCH:  No active depression or anxiety EXT:  No edema, ecchymosis, or cyanosis  Signed, Early Osmond, MD  01/07/2022 11:32 AM    [address], [place], [place]  [address] Phone: [phone]; Fax: [phone]   Note:  This document was prepared using Dragon voice recognition software and may include unintentional dictation errors.

## 2022-01-07 ENCOUNTER — Ambulatory Visit: Payer: 59 | Attending: Internal Medicine | Admitting: Internal Medicine

## 2022-01-07 ENCOUNTER — Encounter: Payer: Self-pay | Admitting: Internal Medicine

## 2022-01-07 VITALS — BP 108/62 | HR 92 | Ht 69.0 in | Wt 270.4 lb

## 2022-01-07 DIAGNOSIS — R0600 Dyspnea, unspecified: Secondary | ICD-10-CM

## 2022-01-07 DIAGNOSIS — E1159 Type 2 diabetes mellitus with other circulatory complications: Secondary | ICD-10-CM | POA: Diagnosis not present

## 2022-01-07 DIAGNOSIS — R400 Somnolence: Secondary | ICD-10-CM

## 2022-01-07 DIAGNOSIS — E118 Type 2 diabetes mellitus with unspecified complications: Secondary | ICD-10-CM

## 2022-01-07 DIAGNOSIS — I152 Hypertension secondary to endocrine disorders: Secondary | ICD-10-CM

## 2022-01-07 DIAGNOSIS — I25110 Atherosclerotic heart disease of native coronary artery with unstable angina pectoris: Secondary | ICD-10-CM

## 2022-01-07 DIAGNOSIS — Z6841 Body Mass Index (BMI) 40.0 and over, adult: Secondary | ICD-10-CM

## 2022-01-07 DIAGNOSIS — Z794 Long term (current) use of insulin: Secondary | ICD-10-CM

## 2022-01-07 DIAGNOSIS — E1169 Type 2 diabetes mellitus with other specified complication: Secondary | ICD-10-CM

## 2022-01-07 DIAGNOSIS — E785 Hyperlipidemia, unspecified: Secondary | ICD-10-CM

## 2022-01-07 NOTE — Patient Instructions (Signed)
Medication Instructions:  Your physician recommends that you continue on your current medications as directed. Please refer to the Current Medication list given to you today.  *If you need a refill on your cardiac medications before your next appointment, please call your pharmacy*   Lab Work: Lab work to be done today--Lipid and liver profiles If you have labs (blood work) drawn today and your tests are completely normal, you will receive your results only by: [place] (if you have MyChart) OR A paper copy in the mail If you have any lab test that is abnormal or we need to change your treatment, we will call you to review the results.   Testing/Procedures: none   Follow-Up: At [facility], you and your health needs are our priority.  As part of our continuing mission to provide you with exceptional heart care, we have created designated Provider Care Teams.  These Care Teams include your primary Cardiologist (physician) and Advanced Practice Providers (APPs -  Physician Assistants and Nurse Practitioners) who all work together to provide you with the care you need, when you need it.  We recommend signing up for the patient portal called "MyChart".  Sign up information is provided on this After Visit Summary.  MyChart is used to connect with patients for Virtual Visits (Telemedicine).  Patients are able to view lab/test results, encounter notes, upcoming appointments, etc.  Non-urgent messages can be sent to your provider as well.   To learn more about what you can do with MyChart, go to [email or web address].    Your next appointment:   6 month(s)  The format for your next appointment:   In Person  Provider:   Early Osmond, MD     Other Instructions You have been referred to Cardiac Rehab   Important Information About Sugar

## 2022-01-09 ENCOUNTER — Telehealth (HOSPITAL_COMMUNITY): Payer: Self-pay | Admitting: *Deleted

## 2022-01-09 LAB — LIPID PANEL
Chol/HDL Ratio: 2 ratio (ref 0.0–5.0)
Cholesterol, Total: 102 mg/dL (ref 100–199)
HDL: 51 mg/dL (ref >39)
LDL Chol Calc (NIH): 29 mg/dL (ref 0–99)
Triglycerides: 128 mg/dL (ref 0–149)
VLDL Cholesterol Cal: 22 mg/dL (ref 5–40)

## 2022-01-09 LAB — HEPATIC FUNCTION PANEL
ALT: 31 IU/L (ref 0–44)
AST: 24 IU/L (ref 0–40)
Albumin: 4.1 g/dL (ref 3.8–4.9)
Alkaline Phosphatase: 90 IU/L (ref 44–121)
Bilirubin Total: 0.3 mg/dL (ref 0.0–1.2)
Bilirubin, Direct: 0.13 mg/dL (ref 0.00–0.40)
Total Protein: 6.7 g/dL (ref 6.0–8.5)

## 2022-01-09 NOTE — Telephone Encounter (Signed)
Received second referral for this pt to participate in Cardiac Rehab with the diagnosis of NSTEMI/DES.  Pt previously declined to participate as his work schedule would not allow him to attend.  Called and left message requesting a call back to review class times.  Pt remains appropriate to schedule. Cherre Huger, BSN Cardiac and Training and development officer

## 2022-03-06 ENCOUNTER — Telehealth (HOSPITAL_COMMUNITY): Payer: Self-pay

## 2022-03-06 ENCOUNTER — Encounter: Payer: Self-pay | Admitting: Internal Medicine

## 2022-03-06 NOTE — Telephone Encounter (Signed)
Pt called CR and stated he would like to schedule CR for March. Adv pt our March schedule is not open as of yet and that it may open 2nd week in Feb. Pt stated with his work schedule he is scheduled until March 8th.   Will pass referral to RN for review.

## 2022-03-06 NOTE — Telephone Encounter (Signed)
Called to get pt schedule for March, pt stated the [place] location is closer to him. Adv pt I will send his referral to CR at [facility] and they will contact him for scheduling.

## 2022-03-13 ENCOUNTER — Encounter: Payer: Self-pay | Admitting: Internal Medicine

## 2022-03-14 MED ORDER — METOPROLOL SUCCINATE ER 25 MG PO TB24: 12.5000 mg | ORAL_TABLET | Freq: Every day | ORAL | 3 refills | Status: DC

## 2022-03-14 MED ORDER — ROSUVASTATIN CALCIUM 10 MG PO TABS: 10.0000 mg | ORAL_TABLET | Freq: Every day | ORAL | 3 refills | Status: DC

## 2022-03-14 MED ORDER — CLOPIDOGREL BISULFATE 75 MG PO TABS: 75.0000 mg | ORAL_TABLET | Freq: Every day | ORAL | 3 refills | Status: DC

## 2022-03-14 NOTE — Addendum Note (Signed)
Addended by: Carter Kitten D on: 03/14/2022 08:24 AM   Modules accepted: Orders

## 2022-03-15 ENCOUNTER — Other Ambulatory Visit: Payer: Self-pay | Admitting: Internal Medicine

## 2022-03-22 ENCOUNTER — Telehealth: Payer: Self-pay | Admitting: General Practice

## 2022-03-22 ENCOUNTER — Ambulatory Visit: Payer: 59 | Attending: General Practice | Admitting: General Practice

## 2022-03-22 ENCOUNTER — Encounter: Payer: Self-pay | Admitting: General Practice

## 2022-03-22 VITALS — BP 100/76 | HR 75 | Ht 69.0 in | Wt 264.2 lb

## 2022-03-22 DIAGNOSIS — I10 Essential (primary) hypertension: Secondary | ICD-10-CM

## 2022-03-22 DIAGNOSIS — I25110 Atherosclerotic heart disease of native coronary artery with unstable angina pectoris: Secondary | ICD-10-CM

## 2022-03-22 DIAGNOSIS — Z6841 Body Mass Index (BMI) 40.0 and over, adult: Secondary | ICD-10-CM

## 2022-03-22 DIAGNOSIS — E785 Hyperlipidemia, unspecified: Secondary | ICD-10-CM

## 2022-03-22 DIAGNOSIS — R0600 Dyspnea, unspecified: Secondary | ICD-10-CM

## 2022-03-22 NOTE — Progress Notes (Signed)
Cardiology Clinic Note   Patient Name: Michael Sandoval Date of Encounter: 03/22/2022  Primary Care Provider:  London Pepper, MD Primary Cardiologist:  Early Osmond, MD  Patient Profile    Michael Sandoval 57 year old male presents the clinic today for follow-up evaluation of his coronary artery disease and hypertension.  Past Medical History    Past Medical History:  Diagnosis Date   Anxiety    Arthritis    Cancer ([employer])    melanoma skin cancer    Daytime somnolence    Decreased hearing    Depression    Diabetes mellitus, type II ([place])        Dyslipidemia    Elevated blood pressure reading    Elevated coronary artery calcium score    Excessive daytime sleepiness    GERD (gastroesophageal reflux disease)    HTN (hypertension)    HTN (hypertension)    Hyperlipidemia    Lightheadedness    Non-restorative sleep    Obesity    Osteoarthritis of hip    Pneumonia    1990    Sinusitis    Unspecified disorder of nose and nasal sinuses    Past Surgical History:  Procedure Laterality Date   colonoscopy   2006   CORONARY STENT INTERVENTION N/A 09/11/2021   Procedure: CORONARY STENT INTERVENTION;  Surgeon: Troy Sine, MD;  Location: [facility];  Service: Cardiovascular;  Laterality: N/A;   HAND SURGERY     crush injury , small screw in place per surgeon    INTRAVASCULAR ULTRASOUND/IVUS N/A 09/11/2021   Procedure: Intravascular Ultrasound/IVUS;  Surgeon: Troy Sine, MD;  Location: [facility];  Service: Cardiovascular;  Laterality: N/A;   skin cancer   several years ago   [place] Right 07/09/2018   Procedure: TOTAL HIP ARTHROPLASTY ANTERIOR APPROACH;  Surgeon: Rod Can, MD;  Location: [facility];  Service: Orthopedics;  Laterality: Right;    Allergies  Allergies  Allergen Reactions   Onion Diarrhea   Atorvastatin Diarrhea   Capsicum Diarrhea and Nausea Only    Bell peppers   Lactose  Intolerance (Gi) Diarrhea    History of Present Illness    Michael Sandoval has a PMH of HTN, NSTEMI, chronic sinusitis, GERD, type 2 diabetes, right hand fracture, daytime somnolence, hyperlipidemia, morbid obesity, anxiety, depression, and sleep disorder.  He underwent cardiopulmonary exercise stress test 10/03/21 which showed mild functional impairment when compared to sedentary norms.  No cardiopulmonary abnormality was noted.  It was felt that his limitations were due to body habitus and deconditioning.  His VE/VCO2 slope was elevated and could indicate increased pulmonary pressures however there was hyperventilatory component which played a role in his elevated VE/VCO2 slope.  He underwent cardiac catheterization in 2023 with PCI to his proximal-mid LAD.  He received DES x 1.  He was also noted to have first marginal lesion 20% and second marginal lesion 85% stenosed.  He was placed on dual antiplatelet therapy.  Echocardiogram 8/23 showed LVEF of 55-65%, G1 DD and no significant valvular abnormalities.   He was seen in follow-up by Dr. Ali Lowe on 01/07/2022.  During that time his cardiopulmonary exercise test was reassuring.  He was instructed to continue exercise to help with his deconditioning.  It was recommended that he discuss GLP-1 with his endocrinologist.  His blood pressure was well-controlled.  He was awaiting results from sleep study.  His dual antiplatelet therapy  was planned to be continued through 7/24.  At that time plan was to discontinue aspirin and continue Plavix monotherapy.  He presents to the clinic today for follow-up evaluation and states he was at his endocrinologist earlier today and his blood pressure was 85/52.  He reports dizziness for the last few weeks.  His dizziness and goes away after minutes.  He has not been checking his blood pressure regularly.  He reports that it was normally in the low 100s over 60s-80s.  We reviewed his cardiac catheterization and  echocardiogram.  He expressed understanding.  He reports that he is more physically active, continues to try to lose weight, and is on new medication for weight loss.  I encouraged him to increase his p.o. hydration, change positions slowly, I will plan follow-up in 1 to 2 months.  Have asked him to maintain a blood pressure log.  Today he denies chest pain, shortness of breath, lower extremity edema, palpitations, melena, hematuria, hemoptysis, diaphoresis, weakness, presyncope, syncope, orthopnea, and PND.   Home Medications    Prior to Admission medications   Medication Sig Start Date End Date Taking? Authorizing Provider  aspirin EC 81 MG tablet Take 81 mg by mouth daily. Swallow whole.    [provider]  cetirizine (ZYRTEC) 10 MG tablet Take 10 mg by mouth as needed for allergies.    [provider]  clopidogrel (PLAVIX) 75 MG tablet Take 1 tablet (75 mg total) by mouth daily. 03/14/22   Early Osmond, MD  ezetimibe (ZETIA) 10 MG tablet Take 1 tablet (10 mg total) by mouth daily. 11/20/21   Early Osmond, MD  fluticasone (FLONASE) 50 MCG/ACT nasal spray Place 1 spray into both nostrils daily as needed for allergies.    [provider]  glucose blood test strip 1 each by Other route as needed for other. Use as instructed One Touch Verio (Blood Glucose Test) One Touch Delica Lancets [id number]    [provider]  insulin aspart protamine - aspart (NOVOLOG MIX 70/30 FLEXPEN) (70-30) 100 UNIT/ML FlexPen Inject 35 Units into the skin 2 (two) times daily with a meal. 07/13/21   [provider]  lisinopril (PRINIVIL,ZESTRIL) 10 MG tablet Take 10 mg by mouth daily. 07/20/15   [provider]  metoprolol succinate (TOPROL XL) 25 MG 24 hr tablet Take 0.5 tablets (12.5 mg total) by mouth daily. 03/14/22   Early Osmond, MD  Multiple Vitamins-Minerals (MULTIVITAMIN WITH MINERALS) tablet Take 1 tablet by mouth daily.    [provider]   nitroGLYCERIN (NITROSTAT) 0.4 MG SL tablet Place 1 tablet (0.4 mg total) under the tongue every 5 (five) minutes as needed for chest pain. 08/20/21 11/18/21  Early Osmond, MD  NOVOLIN 70/30 KWIKPEN (70-30) 100 UNIT/ML KwikPen Inject 35 Units into the skin 2 (two) times daily. 05/15/21   [provider]  omeprazole (PRILOSEC OTC) 20 MG tablet Take 1 tablet by mouth daily as needed (for acid reflux).    [provider]  rosuvastatin (CRESTOR) 10 MG tablet Take 1 tablet (10 mg total) by mouth daily. 03/14/22   Early Osmond, MD  venlafaxine XR (EFFEXOR-XR) 150 MG 24 hr capsule Take 150 mg by mouth daily. 12/24/21   [provider]  XIGDUO XR 06-998 MG TB24 Take 1 tablet by mouth 2 (two) times daily. 07/22/21   [provider]    Family History    Family History  Problem Relation Age of Onset   Heart  attack Mother 89   Hypertension Father    He indicated that his mother is deceased. He indicated that his father is alive.  Social History    Social History   Socioeconomic History   Marital status: Single    Spouse name: Not on file   Number of children: Not on file   Years of education: Not on file   Highest education level: Not on file  Occupational History   Not on file  Tobacco Use   Smoking status: Never   Smokeless tobacco: Never  Vaping Use   Vaping Use: Never used  Substance and Sexual Activity   Alcohol use: Yes    Comment: rare   Drug use: No   Sexual activity: Not on file  Other Topics Concern   Not on file  Social History Narrative   Works Hotel manager support.     Right handed    Social Determinants of Health   Financial Resource Strain: Not on file  Food Insecurity: Not on file  Transportation Needs: Not on file  Physical Activity: Not on file  Stress: Not on file  Social Connections: Not on file  Intimate Partner Violence: Not on file     Review of Systems    General:  No chills, fever, night sweats or weight  changes.  Cardiovascular:  No chest pain, dyspnea on exertion, edema, orthopnea, palpitations, paroxysmal nocturnal dyspnea. Dermatological: No rash, lesions/masses Respiratory: No cough, dyspnea Urologic: No hematuria, dysuria Abdominal:   No nausea, vomiting, diarrhea, bright red blood per rectum, melena, or hematemesis Neurologic:  No visual changes, wkns, changes in mental status. All other systems reviewed and are otherwise negative except as noted above.  Physical Exam    VS:  BP 100/76   Pulse 75   Ht 5' 9"$  (1.753 m)   Wt 264 lb 3.2 oz (119.8 kg)   SpO2 99%   BMI 39.02 kg/m  , BMI Body mass index is 39.02 kg/m. GEN: Well nourished, well developed, in no acute distress. HEENT: normal. Neck: Supple, no JVD, carotid bruits, or masses. Cardiac: RRR, no murmurs, rubs, or gallops. No clubbing, cyanosis, edema.  Radials/DP/PT 2+ and equal bilaterally.  Respiratory:  Respirations regular and unlabored, clear to auscultation bilaterally. GI: Soft, nontender, nondistended, BS + x 4. MS: no deformity or atrophy. Skin: warm and dry, no rash. Neuro:  Strength and sensation are intact. Psych: Normal affect.  Accessory Clinical Findings    Recent Labs: 09/12/2021: BUN 17; Creatinine, Ser 0.96; Hemoglobin 12.9; Platelets 208; Potassium 3.7; Sodium 137 01/07/2022: ALT 31   Recent Lipid Panel    Component Value Date/Time   CHOL 102 01/07/2022 1117   TRIG 128 01/07/2022 1117   HDL 51 01/07/2022 1117   CHOLHDL 2.0 01/07/2022 1117   LDLCALC 29 01/07/2022 1117         ECG personally reviewed by me today-none today.  Echocardiogram 09/12/2021  IMPRESSIONS     1. Left ventricular ejection fraction, by estimation, is 55 to 60%. The  left ventricle has normal function. The left ventricle has no regional  wall motion abnormalities. There is mild left ventricular hypertrophy.  Left ventricular diastolic parameters  are consistent with Grade I diastolic dysfunction (impaired  relaxation).   2. Right ventricular systolic function is normal. The right ventricular  size is normal. Tricuspid regurgitation signal is inadequate for assessing  PA pressure.   3. The mitral valve is normal in structure. No evidence of mitral valve  regurgitation. No evidence  of mitral stenosis.   4. The aortic valve is tricuspid. Aortic valve regurgitation is not  visualized. No aortic stenosis is present.   5. The inferior vena cava is normal in size with <50% respiratory  variability, suggesting right atrial pressure of 8 mmHg.   FINDINGS   Left Ventricle: Left ventricular ejection fraction, by estimation, is 55  to 60%. The left ventricle has normal function. The left ventricle has no  regional wall motion abnormalities. The left ventricular internal cavity  size was normal in size. There is   mild left ventricular hypertrophy. Left ventricular diastolic parameters  are consistent with Grade I diastolic dysfunction (impaired relaxation).   Right Ventricle: The right ventricular size is normal. No increase in  right ventricular wall thickness. Right ventricular systolic function is  normal. Tricuspid regurgitation signal is inadequate for assessing PA  pressure.   Left Atrium: Left atrial size was normal in size.   Right Atrium: Right atrial size was normal in size.   Pericardium: There is no evidence of pericardial effusion.   Mitral Valve: The mitral valve is normal in structure. No evidence of  mitral valve regurgitation. No evidence of mitral valve stenosis.   Tricuspid Valve: The tricuspid valve is normal in structure. Tricuspid  valve regurgitation is not demonstrated.   Aortic Valve: The aortic valve is tricuspid. Aortic valve regurgitation is  not visualized. No aortic stenosis is present.   Pulmonic Valve: The pulmonic valve was normal in structure. Pulmonic valve  regurgitation is not visualized.   Aorta: The aortic root is normal in size and structure.    Venous: The inferior vena cava is normal in size with less than 50%  respiratory variability, suggesting right atrial pressure of 8 mmHg.   IAS/Shunts: No atrial level shunt detected by color flow Doppler.    Cardiac catheterization 09/11/2021    2nd Mrg lesion is 85% stenosed.   1st Mrg lesion is 20% stenosed.   Mid LAD lesion is 90% stenosed.   A drug-eluting stent was successfully placed.   Post intervention, there is a 0% residual stenosis.   The patient had undergone prior diagnostic catheterization at [facility]) on September 04, 2021.  CD images were reviewed and revealed a long 85 to 90% LAD stenosis in the proximal to mid LAD but not including the large diagonal vessel and he had minimal narrowing in the high marginal vessel with focal 85% stenosis in a very small second marginal branch of the circumflex.  He had a normal dominant RCA. Subsequent surgical consultation with Dr. Nils Pyle was done yesterday who felt the patient would be best treated with PCI.  He was admitted last evening with recurrent chest pain and presents now for PCI to his LAD   Intravascular ultrasound showed mild to moderate calcification with the LAD vessel size at 3.75 mm.   PCI was performed with predilatation with a 2.0 x 20 mm balloon, Score-flex 2.5 x 15 mm cutting balloon with DES stenting with a 3.5 x 24 mm Synergy stent postdilated to 3.75 mm with the stenoses being reduced to 0% and brisk TIMI-3 flow.   RECOMMENDATION: Continue DAPT with aspirin/Brilinta for minimum of 12 months.  Initiate a retrial of lipid-lowering therapy with the patient's history of previous diarrhea secondary to atorvastatin.  Optimal blood pressure control with target blood pressure less than 130/80 and optimal diabetic management.    Diagnostic Dominance: Right  Intervention      Assessment & Plan  1.  Coronary artery disease-no chest pain today.  Cardiac catheterization 8/23 with PCI and DES to his  proximal-mid LAD.  He is also noted to have 20% first marginal lesion and second marginal lesion 95% stenosis. Continue aspirin, Plavix, ezetimibe, rosuvastatin, nitroglycerin as needed Heart healthy low-sodium diet-salty 6 given Increase physical activity as tolerated  Dyspnea-improving with increased physical activity/cardiac rehab.  Cardiopulmonary stress test reassuring. Continue to increase physical activity as tolerated Continue weight loss  Hyperlipidemia-01/07/2022: Cholesterol, Total 102; HDL 51; LDL Chol Calc (NIH) 29; Triglycerides 128 Continue rosuvastatin Heart healthy low-sodium high-fiber diet Increase physical activity as tolerated  Essential hypertension-BP today 100/76 Continue  metoprolol - hold for sbp less than 100 Discontinue lisinopril Heart healthy low-sodium diet-salty 6 given Increase physical activity as tolerated  Elevated BMI-weight today 264.2. Continue weight loss. Increase physical activity as tolerated  Disposition: Follow-up with Dr.Thukkani or APP in 1-2 months.   Jossie Ng. Tahirah Sara NP-C     03/22/2022, 11:05 AM [place] [address] Office [phone] Fax [phone]    I spent 14 minutes examining this patient, reviewing medications, and using patient centered shared decision making involving her cardiac care.  Prior to her visit I spent greater than 20 minutes reviewing her past medical history,  medications, and prior cardiac tests.

## 2022-03-22 NOTE — Patient Instructions (Addendum)
Medication Instructions:  Your physician has recommended you make the following change in your medication:  STOP: Lisinopril Metoprolol: If your systolic number (top number) of your BP is less than 100 please DO NOT TAKE your metoprolol.  *If you need a refill on your cardiac medications before your next appointment, please call your pharmacy*   Please increase hydration and change positions slowly  Please take your blood pressure daily and include heart rates.   HOW TO TAKE YOUR BLOOD PRESSURE: Rest 5 minutes before taking your blood pressure. Don't smoke or drink caffeinated beverages for at least 30 minutes before. Take your blood pressure before (not after) you eat. Sit comfortably with your back supported and both feet on the floor (don't cross your legs). Elevate your arm to heart level on a table or a desk. Use the proper sized cuff. It should fit smoothly and snugly around your bare upper arm. There should be enough room to slip a fingertip under the cuff. The bottom edge of the cuff should be 1 inch above the crease of the elbow. Ideally, take 3 measurements at one sitting and record the average.    Lab Work: NONE If you have labs (blood work) drawn today and your tests are completely normal, you will receive your results only by: [place] (if you have MyChart) OR A paper copy in the mail If you have any lab test that is abnormal or we need to change your treatment, we will call you to review the results.   Testing/Procedures: NONE   Follow-Up: At [facility], you and your health needs are our priority.  As part of our continuing mission to provide you with exceptional heart care, we have created designated Provider Care Teams.  These Care Teams include your primary Cardiologist (physician) and Advanced Practice Providers (APPs -  Physician Assistants and Nurse Practitioners) who all work together to provide you with the care you need, when you need  it.  We recommend signing up for the patient portal called "MyChart".  Sign up information is provided on this After Visit Summary.  MyChart is used to connect with patients for Virtual Visits (Telemedicine).  Patients are able to view lab/test results, encounter notes, upcoming appointments, etc.  Non-urgent messages can be sent to your provider as well.   To learn more about what you can do with MyChart, go to [email or web address].    Your next appointment:   1-2 month(s)  Provider:   Coletta Memos, FNP

## 2022-03-22 NOTE — Telephone Encounter (Signed)
Scheduled patient to see Coletta Memos for this morning at 10:30.

## 2022-03-27 ENCOUNTER — Encounter (HOSPITAL_COMMUNITY): Payer: 59

## 2022-04-09 ENCOUNTER — Encounter: Payer: Self-pay | Admitting: Internal Medicine

## 2022-04-09 MED ORDER — EZETIMIBE 10 MG PO TABS: 10.0000 mg | ORAL_TABLET | Freq: Every day | ORAL | 3 refills | Status: DC

## 2022-04-22 NOTE — Progress Notes (Signed)
Cardiology Clinic Note   Patient Name: Michael Sandoval Date of Encounter: 04/24/2022  Primary Care Provider:  London Pepper, MD Primary Cardiologist:  Early Osmond, MD  Patient Profile    Michael Sandoval 57 year old male presents the clinic today for follow-up evaluation of his coronary artery disease and hypertension.  Past Medical History    Past Medical History:  Diagnosis Date   Anxiety    Arthritis    Cancer ([place])    melanoma skin cancer    Daytime somnolence    Decreased hearing    Depression    Diabetes mellitus, type II ([place])        Dyslipidemia    Elevated blood pressure reading    Elevated coronary artery calcium score    Excessive daytime sleepiness    GERD (gastroesophageal reflux disease)    HTN (hypertension)    HTN (hypertension)    Hyperlipidemia    Lightheadedness    Non-restorative sleep    Obesity    Osteoarthritis of hip    Pneumonia    1990    Sinusitis    Unspecified disorder of nose and nasal sinuses    Past Surgical History:  Procedure Laterality Date   colonoscopy   2006   CORONARY STENT INTERVENTION N/A 09/11/2021   Procedure: CORONARY STENT INTERVENTION;  Surgeon: Troy Sine, MD;  Location: [facility];  Service: Cardiovascular;  Laterality: N/A;   HAND SURGERY     crush injury , small screw in place per surgeon    INTRAVASCULAR ULTRASOUND/IVUS N/A 09/11/2021   Procedure: Intravascular Ultrasound/IVUS;  Surgeon: Troy Sine, MD;  Location: [facility];  Service: Cardiovascular;  Laterality: N/A;   skin cancer   several years ago   [place] Right 07/09/2018   Procedure: TOTAL HIP ARTHROPLASTY ANTERIOR APPROACH;  Surgeon: Rod Can, MD;  Location: [facility];  Service: Orthopedics;  Laterality: Right;    Allergies  Allergies  Allergen Reactions   Onion Diarrhea   Atorvastatin Diarrhea   Capsicum Diarrhea and Nausea Only    Bell peppers   Lactose  Intolerance (Gi) Diarrhea    History of Present Illness    Michael Sandoval has a PMH of HTN, NSTEMI, chronic sinusitis, GERD, type 2 diabetes, right hand fracture, daytime somnolence, hyperlipidemia, morbid obesity, anxiety, depression, and sleep disorder.  He underwent cardiopulmonary exercise stress test 10/03/21 which showed mild functional impairment when compared to sedentary norms.  No cardiopulmonary abnormality was noted.  It was felt that his limitations were due to body habitus and deconditioning.  His VE/VCO2 slope was elevated and could indicate increased pulmonary pressures however there was hyperventilatory component which played a role in his elevated VE/VCO2 slope.  He underwent cardiac catheterization in 2023 with PCI to his proximal-mid LAD.  He received DES x 1.  He was also noted to have first marginal lesion 20% and second marginal lesion 85% stenosed.  He was placed on dual antiplatelet therapy.  Echocardiogram 8/23 showed LVEF of 55-65%, G1 DD and no significant valvular abnormalities.   He was seen in follow-up by Dr. Ali Lowe on 01/07/2022.  During that time his cardiopulmonary exercise test was reassuring.  He was instructed to continue exercise to help with his deconditioning.  It was recommended that he discuss GLP-1 with his endocrinologist.  His blood pressure was well-controlled.  He was awaiting results from sleep study.  His dual antiplatelet therapy  was planned to be continued through 7/24.  At that time plan was to discontinue aspirin and continue Plavix monotherapy.  He presented to the clinic 03/22/22 for follow-up evaluation and stated he was at his endocrinologist earlier that day and his blood pressure was 85/52.  He reported dizziness for the last few weeks.  His dizziness would go away after minutes.  He had not been checking his blood pressure regularly.  He reported that it was normally in the low 100s over 60s-80s.  We reviewed his cardiac catheterization and  echocardiogram.  He expressed understanding.  He reported that he was more physically active, continued to try to lose weight, and was on new medication for weight loss.  I encouraged him to increase his p.o. hydration, change positions slowly, planned follow-up in 1 to 2 months.  I asked him to maintain a blood pressure log.  He presents to the clinic today for follow-up evaluation and states his blood pressure has been in the 120s-130s.  He brings in an [facility] log which shows an average systolic blood pressure of 129.  He also indicates that his monitor is showed him that he has occasional irregular heartbeats.  He does not feel irregular heartbeats.  His weight today is improved at 260.2 pounds.  He does notice some dizziness and occasional pulse type sensation in his ears.  He was previously evaluated by neurology and ENT.  I will give him Epley maneuvers to practice.  He may start cardiac rehab.  I will refill his nitroglycerin and plan follow-up in 6 months.  Today he denies chest pain, shortness of breath, lower extremity edema, palpitations, melena, hematuria, hemoptysis, diaphoresis, weakness, presyncope, syncope, orthopnea, and PND.    Home Medications    Prior to Admission medications   Medication Sig Start Date End Date Taking? Authorizing Provider  aspirin EC 81 MG tablet Take 81 mg by mouth daily. Swallow whole.    [provider]  cetirizine (ZYRTEC) 10 MG tablet Take 10 mg by mouth as needed for allergies.    [provider]  clopidogrel (PLAVIX) 75 MG tablet Take 1 tablet (75 mg total) by mouth daily. 03/14/22   Early Osmond, MD  ezetimibe (ZETIA) 10 MG tablet Take 1 tablet (10 mg total) by mouth daily. 11/20/21   Early Osmond, MD  fluticasone (FLONASE) 50 MCG/ACT nasal spray Place 1 spray into both nostrils daily as needed for allergies.    [provider]  glucose blood test strip 1 each by Other route as needed for other. Use as instructed One  Touch Verio (Blood Glucose Test) One Touch Delica Lancets [id number]    [provider]  insulin aspart protamine - aspart (NOVOLOG MIX 70/30 FLEXPEN) (70-30) 100 UNIT/ML FlexPen Inject 35 Units into the skin 2 (two) times daily with a meal. 07/13/21   [provider]  lisinopril (PRINIVIL,ZESTRIL) 10 MG tablet Take 10 mg by mouth daily. 07/20/15   [provider]  metoprolol succinate (TOPROL XL) 25 MG 24 hr tablet Take 0.5 tablets (12.5 mg total) by mouth daily. 03/14/22   Early Osmond, MD  Multiple Vitamins-Minerals (MULTIVITAMIN WITH MINERALS) tablet Take 1 tablet by mouth daily.    [provider]  nitroGLYCERIN (NITROSTAT) 0.4 MG SL tablet Place 1 tablet (0.4 mg total) under the tongue every 5 (five) minutes as needed for chest pain. 08/20/21 11/18/21  Early Osmond, MD  NOVOLIN 70/30 KWIKPEN (70-30) 100 UNIT/ML KwikPen Inject 35 Units into the  skin 2 (two) times daily. 05/15/21   [provider]  omeprazole (PRILOSEC OTC) 20 MG tablet Take 1 tablet by mouth daily as needed (for acid reflux).    [provider]  rosuvastatin (CRESTOR) 10 MG tablet Take 1 tablet (10 mg total) by mouth daily. 03/14/22   Early Osmond, MD  venlafaxine XR (EFFEXOR-XR) 150 MG 24 hr capsule Take 150 mg by mouth daily. 12/24/21   [provider]  XIGDUO XR 06-998 MG TB24 Take 1 tablet by mouth 2 (two) times daily. 07/22/21   [provider]    Family History    Family History  Problem Relation Age of Onset   Heart attack Mother 63   Hypertension Father    He indicated that his mother is deceased. He indicated that his father is alive.  Social History    Social History   Socioeconomic History   Marital status: Single    Spouse name: Not on file   Number of children: Not on file   Years of education: Not on file   Highest education level: Not on file  Occupational History   Not on file  Tobacco Use   Smoking status: Never   Smokeless  tobacco: Never  Vaping Use   Vaping Use: Never used  Substance and Sexual Activity   Alcohol use: Yes    Comment: rare   Drug use: No   Sexual activity: Not on file  Other Topics Concern   Not on file  Social History Narrative   Works Hotel manager support.     Right handed    Social Determinants of Health   Financial Resource Strain: Not on file  Food Insecurity: Not on file  Transportation Needs: Not on file  Physical Activity: Not on file  Stress: Not on file  Social Connections: Not on file  Intimate Partner Violence: Not on file     Review of Systems    General:  No chills, fever, night sweats or weight changes.  Cardiovascular:  No chest pain, dyspnea on exertion, edema, orthopnea, palpitations, paroxysmal nocturnal dyspnea. Dermatological: No rash, lesions/masses Respiratory: No cough, dyspnea Urologic: No hematuria, dysuria Abdominal:   No nausea, vomiting, diarrhea, bright red blood per rectum, melena, or hematemesis Neurologic:  No visual changes, wkns, changes in mental status. All other systems reviewed and are otherwise negative except as noted above.  Physical Exam    VS:  BP 122/72   Pulse 81   Ht '5\' 9"'$  (1.753 m)   Wt 260 lb 3.2 oz (118 kg)   SpO2 95%   BMI 38.42 kg/m  , BMI Body mass index is 38.42 kg/m. GEN: Well nourished, well developed, in no acute distress. HEENT: normal. Neck: Supple, no JVD, carotid bruits, or masses. Cardiac: RRR, no murmurs, rubs, or gallops. No clubbing, cyanosis, edema.  Radials/DP/PT 2+ and equal bilaterally.  Respiratory:  Respirations regular and unlabored, clear to auscultation bilaterally. GI: Soft, nontender, nondistended, BS + x 4. MS: no deformity or atrophy. Skin: warm and dry, no rash. Neuro:  Strength and sensation are intact. Psych: Normal affect.  Accessory Clinical Findings    Recent Labs: 09/12/2021: BUN 17; Creatinine, Ser 0.96; Hemoglobin 12.9; Platelets 208; Potassium 3.7; Sodium 137 01/07/2022: ALT  31   Recent Lipid Panel    Component Value Date/Time   CHOL 102 01/07/2022 1117   TRIG 128 01/07/2022 1117   HDL 51 01/07/2022 1117   CHOLHDL 2.0 01/07/2022 1117   LDLCALC 29 01/07/2022 1117  ECG personally reviewed by me today-none today.  Echocardiogram 09/12/2021  IMPRESSIONS     1. Left ventricular ejection fraction, by estimation, is 55 to 60%. The  left ventricle has normal function. The left ventricle has no regional  wall motion abnormalities. There is mild left ventricular hypertrophy.  Left ventricular diastolic parameters  are consistent with Grade I diastolic dysfunction (impaired relaxation).   2. Right ventricular systolic function is normal. The right ventricular  size is normal. Tricuspid regurgitation signal is inadequate for assessing  PA pressure.   3. The mitral valve is normal in structure. No evidence of mitral valve  regurgitation. No evidence of mitral stenosis.   4. The aortic valve is tricuspid. Aortic valve regurgitation is not  visualized. No aortic stenosis is present.   5. The inferior vena cava is normal in size with <50% respiratory  variability, suggesting right atrial pressure of 8 mmHg.   FINDINGS   Left Ventricle: Left ventricular ejection fraction, by estimation, is 55  to 60%. The left ventricle has normal function. The left ventricle has no  regional wall motion abnormalities. The left ventricular internal cavity  size was normal in size. There is   mild left ventricular hypertrophy. Left ventricular diastolic parameters  are consistent with Grade I diastolic dysfunction (impaired relaxation).   Right Ventricle: The right ventricular size is normal. No increase in  right ventricular wall thickness. Right ventricular systolic function is  normal. Tricuspid regurgitation signal is inadequate for assessing PA  pressure.   Left Atrium: Left atrial size was normal in size.   Right Atrium: Right atrial size was normal in size.    Pericardium: There is no evidence of pericardial effusion.   Mitral Valve: The mitral valve is normal in structure. No evidence of  mitral valve regurgitation. No evidence of mitral valve stenosis.   Tricuspid Valve: The tricuspid valve is normal in structure. Tricuspid  valve regurgitation is not demonstrated.   Aortic Valve: The aortic valve is tricuspid. Aortic valve regurgitation is  not visualized. No aortic stenosis is present.   Pulmonic Valve: The pulmonic valve was normal in structure. Pulmonic valve  regurgitation is not visualized.   Aorta: The aortic root is normal in size and structure.   Venous: The inferior vena cava is normal in size with less than 50%  respiratory variability, suggesting right atrial pressure of 8 mmHg.   IAS/Shunts: No atrial level shunt detected by color flow Doppler.    Cardiac catheterization 09/11/2021    2nd Mrg lesion is 85% stenosed.   1st Mrg lesion is 20% stenosed.   Mid LAD lesion is 90% stenosed.   A drug-eluting stent was successfully placed.   Post intervention, there is a 0% residual stenosis.   The patient had undergone prior diagnostic catheterization at [facility]) on September 04, 2021.  CD images were reviewed and revealed a long 85 to 90% LAD stenosis in the proximal to mid LAD but not including the large diagonal vessel and he had minimal narrowing in the high marginal vessel with focal 85% stenosis in a very small second marginal branch of the circumflex.  He had a normal dominant RCA. Subsequent surgical consultation with Dr. Nils Pyle was done yesterday who felt the patient would be best treated with PCI.  He was admitted last evening with recurrent chest pain and presents now for PCI to his LAD   Intravascular ultrasound showed mild to moderate calcification with the LAD vessel size at 3.75 mm.  PCI was performed with predilatation with a 2.0 x 20 mm balloon, Score-flex 2.5 x 15 mm cutting balloon with DES  stenting with a 3.5 x 24 mm Synergy stent postdilated to 3.75 mm with the stenoses being reduced to 0% and brisk TIMI-3 flow.   RECOMMENDATION: Continue DAPT with aspirin/Brilinta for minimum of 12 months.  Initiate a retrial of lipid-lowering therapy with the patient's history of previous diarrhea secondary to atorvastatin.  Optimal blood pressure control with target blood pressure less than 130/80 and optimal diabetic management.    Diagnostic Dominance: Right  Intervention      Assessment & Plan   1.  Essential hypertension-BP today 122/72.  Blood pressure log shows average systolic blood pressure in the 129 range over the last month. Continue  metoprolol  Heart healthy low-sodium diet Maintain physical activity  Coronary artery disease-no chest pain today.  Underwent cardiac catheterization 8/23 with PCI and DES to his proximal-mid LAD.  He is also noted to have 20% first marginal lesion and second marginal lesion 95% stenosis. Ready to start cardiac rehab Continue aspirin, Plavix, ezetimibe, rosuvastatin, nitroglycerin as needed Refill nitro   Dyspnea-breathing stable.  Prior cardiopulmonary stress test reassuring. Maintain physical activity Continue weight loss  Elevated BMI-weight today 260.2. Continue weight loss. Increase physical activity as tolerated  Dizziness-previously seen by ENT and neurology. Epley maneuvers given  Disposition: Follow-up with Dr.Thukkani  in 4-6 months.  Jossie Ng. Jag Lenz NP-C     04/24/2022, 3:55 PM [place] [address] Office [phone] Fax [phone]    I spent 14 minutes examining this patient, reviewing medications, and using patient centered shared decision making involving her cardiac care.  Prior to her visit I spent greater than 20 minutes reviewing her past medical history,  medications, and prior cardiac tests.

## 2022-04-24 ENCOUNTER — Encounter: Payer: Self-pay | Admitting: General Practice

## 2022-04-24 ENCOUNTER — Ambulatory Visit: Payer: 59 | Attending: General Practice | Admitting: General Practice

## 2022-04-24 VITALS — BP 122/72 | HR 81 | Ht 69.0 in | Wt 260.2 lb

## 2022-04-24 DIAGNOSIS — I10 Essential (primary) hypertension: Secondary | ICD-10-CM | POA: Diagnosis not present

## 2022-04-24 DIAGNOSIS — I25110 Atherosclerotic heart disease of native coronary artery with unstable angina pectoris: Secondary | ICD-10-CM | POA: Diagnosis not present

## 2022-04-24 DIAGNOSIS — R0600 Dyspnea, unspecified: Secondary | ICD-10-CM

## 2022-04-24 DIAGNOSIS — R42 Dizziness and giddiness: Secondary | ICD-10-CM

## 2022-04-24 DIAGNOSIS — Z6841 Body Mass Index (BMI) 40.0 and over, adult: Secondary | ICD-10-CM

## 2022-04-24 MED ORDER — NITROGLYCERIN 0.4 MG SL SUBL: 0.4000 mg | SUBLINGUAL_TABLET | SUBLINGUAL | 3 refills | Status: DC | PRN

## 2022-04-24 NOTE — Patient Instructions (Signed)
Medication Instructions:  The current medical regimen is effective;  continue present plan and medications as directed. Please refer to the Current Medication list given to you today.  *If you need a refill on your cardiac medications before your next appointment, please call your pharmacy*  Lab Work: NONE If you have labs (blood work) drawn today and your tests are completely normal, you will receive your results only by: [place] (if you have MyChart) OR  A paper copy in the mail If you have any lab test that is abnormal or we need to change your treatment, we will call you to review the results.  Testing/Procedures: NONE   Follow-Up: At [facility], you and your health needs are our priority.  As part of our continuing mission to provide you with exceptional heart care, we have created designated Provider Care Teams.  These Care Teams include your primary Cardiologist (physician) and Advanced Practice Providers (APPs -  Physician Assistants and Nurse Practitioners) who all work together to provide you with the care you need, when you need it.  We recommend signing up for the patient portal called "MyChart".  Sign up information is provided on this After Visit Summary.  MyChart is used to connect with patients for Virtual Visits (Telemedicine).  Patients are able to view lab/test results, encounter notes, upcoming appointments, etc.  Non-urgent messages can be sent to your provider as well.   To learn more about what you can do with MyChart, go to [email or web address].    Your next appointment:   6 month(s)  Provider:   Early Osmond, MD     Other Instructions CONTINUE TO MONITOR BLOOD PRESSURE  OK FOR CARDIAC REHAB  How to Perform the Epley Maneuver The Epley maneuver is an exercise that relieves symptoms of vertigo. Vertigo is the feeling that you or your surroundings are moving when they are not. When you feel vertigo, you may feel like the room is spinning  and may have trouble walking. The Epley maneuver is used for a type of vertigo caused by a calcium deposit in a part of the inner ear. The maneuver involves changing head positions to help the deposit move out of the area. You can do this maneuver at home whenever you have symptoms of vertigo. You can repeat it in 24 hours if your vertigo has not gone away. Even though the Epley maneuver may relieve your vertigo for a few weeks, it is possible that your symptoms will return. This maneuver relieves vertigo, but it does not relieve dizziness. What are the risks? If it is done correctly, the Epley maneuver is considered safe. Sometimes it can lead to dizziness or nausea that goes away after a short time. If you develop other symptoms--such as changes in vision, weakness, or numbness--stop doing the maneuver and call your health care provider. Supplies needed: A bed or table. A pillow. How to do the Epley maneuver     Sit on the edge of a bed or table with your back straight and your legs extended or hanging over the edge of the bed or table. Turn your head halfway toward the affected ear or side as told by your health care provider. Lie backward quickly with your head turned until you are lying flat on your back. Your head should dangle (head-hanging position). You may want to position a pillow under your shoulders. Hold this position for at least 30 seconds. If you feel dizzy or have symptoms of vertigo, continue  to hold the position until the symptoms stop. Turn your head to the opposite direction until your unaffected ear is facing down. Your head should continue to dangle. Hold this position for at least 30 seconds. If you feel dizzy or have symptoms of vertigo, continue to hold the position until the symptoms stop. Turn your whole body to the same side as your head so that you are positioned on your side. Your head will now be nearly facedown and no longer needs to dangle. Hold for at least 30  seconds. If you feel dizzy or have symptoms of vertigo, continue to hold the position until the symptoms stop. Sit back up. You can repeat the maneuver in 24 hours if your vertigo does not go away. Follow these instructions at home: For 24 hours after doing the Epley maneuver: Keep your head in an upright position. When lying down to sleep or rest, keep your head raised (elevated) with two or more pillows. Avoid excessive neck movements. Activity Do not drive or use machinery if you feel dizzy. After doing the Epley maneuver, return to your normal activities as told by your health care provider. Ask your health care provider what activities are safe for you. General instructions Drink enough fluid to keep your urine pale yellow. Do not drink alcohol. Take over-the-counter and prescription medicines only as told by your health care provider. Keep all follow-up visits. This is important. Preventing vertigo symptoms Ask your health care provider if there is anything you should do at home to prevent vertigo. He or she may recommend that you: Keep your head elevated with two or more pillows while you sleep. Do not sleep on the side of your affected ear. Get up slowly from bed. Avoid sudden movements during the day. Avoid extreme head positions or movement, such as looking up or bending over. Contact a health care provider if: Your vertigo gets worse. You have other symptoms, including: Nausea. Vomiting. Headache. Get help right away if you: Have vision changes. Have a headache or neck pain that is severe or getting worse. Cannot stop vomiting. Have new numbness or weakness in any part of your body. These symptoms may represent a serious problem that is an emergency. Do not wait to see if the symptoms will go away. Get medical help right away. Call your local emergency services (911 in the [place] Do not drive yourself to the hospital. Summary Vertigo is the feeling that you or your  surroundings are moving when they are not. The Epley maneuver is an exercise that relieves symptoms of vertigo. If the Epley maneuver is done correctly, it is considered safe. This information is not intended to replace advice given to you by your health care provider. Make sure you discuss any questions you have with your health care provider. Document Revised: 12/29/2019 Document Reviewed: 12/29/2019 Elsevier Patient Education  [place].

## 2022-04-25 ENCOUNTER — Encounter (HOSPITAL_COMMUNITY)
Admission: RE | Admit: 2022-04-25 | Discharge: 2022-04-25 | Disposition: A | Discharge status: Home / Self Care | Payer: 59 | Source: Ambulatory Visit | Attending: Internal Medicine | Admitting: Internal Medicine

## 2022-04-25 VITALS — BP 112/64 | HR 70 | Ht 69.0 in | Wt 256.2 lb

## 2022-04-25 DIAGNOSIS — I214 Non-ST elevation (NSTEMI) myocardial infarction: Secondary | ICD-10-CM | POA: Diagnosis present

## 2022-04-25 DIAGNOSIS — Z955 Presence of coronary angioplasty implant and graft: Secondary | ICD-10-CM | POA: Insufficient documentation

## 2022-04-25 NOTE — Progress Notes (Signed)
Cardiac Individual Treatment Plan  Patient Details  Name: Michael Sandoval MRN: [id number] Date of Birth: 04/03/1965 Referring Provider:   Flowsheet Row CARDIAC REHAB PHASE II ORIENTATION from 04/25/2022 in [place]  Referring Provider Dr. Ali Lowe       Initial Encounter Date:  Flowsheet Row CARDIAC REHAB PHASE II ORIENTATION from 04/25/2022 in [place]  Date 04/25/22       Visit Diagnosis: NSTEMI (non-ST elevated myocardial infarction) [facility])  Status post coronary artery stent placement  Patient's Home Medications on Admission:  Current Outpatient Medications:    aspirin EC 81 MG tablet, Take 81 mg by mouth daily. Swallow whole., Disp: , Rfl:    cetirizine (ZYRTEC) 10 MG tablet, Take 10 mg by mouth as needed for allergies., Disp: , Rfl:    clopidogrel (PLAVIX) 75 MG tablet, Take 1 tablet (75 mg total) by mouth daily., Disp: 90 tablet, Rfl: 3   ezetimibe (ZETIA) 10 MG tablet, Take 1 tablet (10 mg total) by mouth daily., Disp: 90 tablet, Rfl: 3   fluticasone (FLONASE) 50 MCG/ACT nasal spray, Place 1 spray into both nostrils daily as needed for allergies., Disp: , Rfl:    glucose blood test strip, 1 each by Other route as needed for other. Use as instructed One Touch Verio (Blood Glucose Test) One Touch Delica Lancets [id number], Disp: , Rfl:    metoprolol succinate (TOPROL XL) 25 MG 24 hr tablet, Take 0.5 tablets (12.5 mg total) by mouth daily., Disp: 45 tablet, Rfl: 3   MOUNJARO 2.5 MG/0.5ML Pen, Inject 2.5 mg into the skin once a week., Disp: , Rfl:    Multiple Vitamins-Minerals (MULTIVITAMIN WITH MINERALS) tablet, Take 1 tablet by mouth daily., Disp: , Rfl:    nitroGLYCERIN (NITROSTAT) 0.4 MG SL tablet, Place 1 tablet (0.4 mg total) under the tongue every 5 (five) minutes as needed for chest pain., Disp: 25 tablet, Rfl: 3   NOVOLIN 70/30 KWIKPEN (70-30) 100 UNIT/ML KwikPen, Inject 15 Units into the skin 2 (two) times daily., Disp: , Rfl:     omeprazole (PRILOSEC OTC) 20 MG tablet, Take 1 tablet by mouth daily as needed (for acid reflux)., Disp: , Rfl:    rosuvastatin (CRESTOR) 10 MG tablet, Take 1 tablet (10 mg total) by mouth daily., Disp: 90 tablet, Rfl: 3   venlafaxine XR (EFFEXOR-XR) 150 MG 24 hr capsule, Take 150 mg by mouth daily., Disp: , Rfl:    XIGDUO XR 06-998 MG TB24, Take 1 tablet by mouth 2 (two) times daily., Disp: , Rfl:   Past Medical History: Past Medical History:  Diagnosis Date   Anxiety    Arthritis    Cancer ([place])    melanoma skin cancer    Daytime somnolence    Decreased hearing    Depression    Diabetes mellitus, type II (HCC)        Dyslipidemia    Elevated blood pressure reading    Elevated coronary artery calcium score    Excessive daytime sleepiness    GERD (gastroesophageal reflux disease)    HTN (hypertension)    HTN (hypertension)    Hyperlipidemia    Lightheadedness    Non-restorative sleep    Obesity    Osteoarthritis of hip    Pneumonia    1990    Sinusitis    Unspecified disorder of nose and nasal sinuses     Tobacco Use: Social History   Tobacco Use  Smoking Status Never  Smokeless Tobacco Never  Labs: Review Flowsheet       Latest Ref Rng & Units 06/30/2018 08/20/2021 01/07/2022  Labs for ITP Cardiac and Pulmonary Rehab  Cholestrol 100 - 199 mg/dL - 231  102   LDL (calc) 0 - 99 mg/dL - 141  29   HDL-C >39 mg/dL - 48  51   Trlycerides 0 - 149 mg/dL - 232  128   Hemoglobin A1c 4.8 - 5.6 % 6.4  - -    Capillary Blood Glucose: Lab Results  Component Value Date   GLUCAP 155 (H) 09/12/2021   GLUCAP 163 (H) 09/12/2021   GLUCAP 196 (H) 09/11/2021   GLUCAP 215 (H) 09/11/2021   GLUCAP 275 (H) 09/11/2021     Exercise Target Goals: Exercise Program Goal: Individual exercise prescription set using results from initial 6 min walk test and THRR while considering  patient's activity barriers and safety.   Exercise Prescription Goal: Starting with aerobic activity  30 plus minutes a day, 3 days per week for initial exercise prescription. Provide home exercise prescription and guidelines that participant acknowledges understanding prior to discharge.  Activity Barriers & Risk Stratification:  Activity Barriers & Cardiac Risk Stratification - 04/25/22 1259       Activity Barriers & Cardiac Risk Stratification   Activity Barriers Joint Problems;Chest Pain/Angina;Balance Concerns;Right Hip Replacement;Deconditioning;History of Falls;Muscular Weakness;Shortness of Breath    Cardiac Risk Stratification High             6 Minute Walk:  6 Minute Walk     Row Name 04/25/22 1412         6 Minute Walk   Phase Initial     Distance 1525 feet     Walk Time 6 minutes     # of Rest Breaks 0     MPH 2.89     METS 3.47     RPE 11     VO2 Peak 12.15     Symptoms No     Resting HR 70 bpm     Resting BP 112/64     Resting Oxygen Saturation  97 %     Exercise Oxygen Saturation  during 6 min walk 99 %     Max Ex. HR 104 bpm     Max Ex. BP 142/74     2 Minute Post BP 124/70              Oxygen Initial Assessment:   Oxygen Re-Evaluation:   Oxygen Discharge (Final Oxygen Re-Evaluation):   Initial Exercise Prescription:  Initial Exercise Prescription - 04/25/22 1400       Date of Initial Exercise RX and Referring Provider   Date 04/25/22    Referring Provider Dr. Ali Lowe    Expected Discharge Date 07/12/22      Treadmill   MPH 2.4    Grade 0    Minutes 17      Recumbant Elliptical   Level 1    RPM 60    Minutes 22      Prescription Details   Frequency (times per week) 3    Duration Progress to 30 minutes of continuous aerobic without signs/symptoms of physical distress      Intensity   THRR 40-80% of Max Heartrate 65-130    Ratings of Perceived Exertion 11-13    Perceived Dyspnea 0-4      Resistance Training   Training Prescription Yes    Weight 4    Reps 10-15  Perform Capillary Blood Glucose  checks as needed.  Exercise Prescription Changes:   Exercise Comments:   Exercise Goals and Review:   Exercise Goals     Row Name 04/25/22 1414             Exercise Goals   Increase Physical Activity Yes       Intervention Provide advice, education, support and counseling about physical activity/exercise needs.;Develop an individualized exercise prescription for aerobic and resistive training based on initial evaluation findings, risk stratification, comorbidities and participant's personal goals.       Expected Outcomes Short Term: Attend rehab on a regular basis to increase amount of physical activity.;Long Term: Add in home exercise to make exercise part of routine and to increase amount of physical activity.;Long Term: Exercising regularly at least 3-5 days a week.       Increase Strength and Stamina Yes       Intervention Provide advice, education, support and counseling about physical activity/exercise needs.;Develop an individualized exercise prescription for aerobic and resistive training based on initial evaluation findings, risk stratification, comorbidities and participant's personal goals.       Expected Outcomes Short Term: Increase workloads from initial exercise prescription for resistance, speed, and METs.;Short Term: Perform resistance training exercises routinely during rehab and add in resistance training at home;Long Term: Improve cardiorespiratory fitness, muscular endurance and strength as measured by increased METs and functional capacity (6MWT)       Able to understand and use rate of perceived exertion (RPE) scale Yes       Intervention Provide education and explanation on how to use RPE scale       Expected Outcomes Short Term: Able to use RPE daily in rehab to express subjective intensity level;Long Term:  Able to use RPE to guide intensity level when exercising independently       Knowledge and understanding of Target Heart Rate Range (THRR) Yes        Intervention Provide education and explanation of THRR including how the numbers were predicted and where they are located for reference       Expected Outcomes Short Term: Able to state/look up THRR;Long Term: Able to use THRR to govern intensity when exercising independently;Short Term: Able to use daily as guideline for intensity in rehab       Able to check pulse independently Yes       Intervention Provide education and demonstration on how to check pulse in carotid and radial arteries.;Review the importance of being able to check your own pulse for safety during independent exercise       Expected Outcomes Long Term: Able to check pulse independently and accurately;Short Term: Able to explain why pulse checking is important during independent exercise       Understanding of Exercise Prescription Yes       Intervention Provide education, explanation, and written materials on patient's individual exercise prescription       Expected Outcomes Short Term: Able to explain program exercise prescription;Long Term: Able to explain home exercise prescription to exercise independently                Exercise Goals Re-Evaluation :    Discharge Exercise Prescription (Final Exercise Prescription Changes):   Nutrition:  Target Goals: Understanding of nutrition guidelines, daily intake of sodium '1500mg'$ , cholesterol '200mg'$ , calories 30% from fat and 7% or less from saturated fats, daily to have 5 or more servings of fruits and vegetables.  Biometrics:  Pre Biometrics - 04/25/22  1415       Pre Biometrics   Height '5\' 9"'$  (1.753 m)    Weight 116.2 kg    Waist Circumference 49 inches    Hip Circumference 46 inches    Waist to Hip Ratio 1.07 %    BMI (Calculated) 37.81    Triceps Skinfold 34 mm    % Body Fat 38.6 %    Grip Strength 46.6 kg    Flexibility 0 in    Single Leg Stand 15 seconds              Nutrition Therapy Plan and Nutrition Goals:  Nutrition Therapy & Goals -  04/25/22 1306       Personal Nutrition Goals   Comments Pt is trying to eat a low carb diet and avoid sugary foods      Intervention Plan   Intervention Nutrition handout(s) given to patient.    Expected Outcomes Short Term Goal: Understand basic principles of dietary content, such as calories, fat, sodium, cholesterol and nutrients.             Nutrition Assessments:  Nutrition Assessments - 04/25/22 1311       MEDFICTS Scores   Pre Score 104            MEDIFICTS Score Key: ?70 Need to make dietary changes  40-70 Heart Healthy Diet ? 40 Therapeutic Level Cholesterol Diet   Picture Your Plate Scores: D34-534 Unhealthy dietary pattern with much room for improvement. 41-50 Dietary pattern unlikely to meet recommendations for good health and room for improvement. 51-60 More healthful dietary pattern, with some room for improvement.  >60 Healthy dietary pattern, although there may be some specific behaviors that could be improved.    Nutrition Goals Re-Evaluation:   Nutrition Goals Discharge (Final Nutrition Goals Re-Evaluation):   Psychosocial: Target Goals: Acknowledge presence or absence of significant depression and/or stress, maximize coping skills, provide positive support system. Participant is able to verbalize types and ability to use techniques and skills needed for reducing stress and depression.  Initial Review & Psychosocial Screening:  Initial Psych Review & Screening - 04/25/22 1303       Initial Review   Current issues with Current Sleep Concerns;Current Stress Concerns;Current Anxiety/Panic;Current Psychotropic Meds    Source of Stress Concerns Occupation;Chronic Illness    Comments pt is having to travel now for his job      [place]? Yes    Comments Herbie Baltimore and Darryl Nestle (friends)      Barriers   Psychosocial barriers to participate in program There are no identifiable barriers or psychosocial needs.       Screening Interventions   Interventions Encouraged to exercise;Provide feedback about the scores to participant    Expected Outcomes Short Term goal: Identification and review with participant of any Quality of Life or Depression concerns found by scoring the questionnaire.;Long Term goal: The participant improves quality of Life and PHQ9 Scores as seen by post scores and/or verbalization of changes;Long Term Goal: Stressors or current issues are controlled or eliminated.             Quality of Life Scores:  Quality of Life - 04/25/22 1411       Quality of Life   Select Quality of Life      Quality of Life Scores   Health/Function Pre 15.43 %    Socioeconomic Pre 26.56 %    Psych/Spiritual Pre 18.79 %    Family  Pre 22.5 %    GLOBAL Pre 19.52 %            Scores of 19 and below usually indicate a poorer quality of life in these areas.  A difference of  2-3 points is a clinically meaningful difference.  A difference of 2-3 points in the total score of the Quality of Life Index has been associated with significant improvement in overall quality of life, self-image, physical symptoms, and general health in studies assessing change in quality of life.  PHQ-9: Review Flowsheet       04/25/2022  Depression screen PHQ 2/9  Decreased Interest 2  Down, Depressed, Hopeless 0  PHQ - 2 Score 2  Altered sleeping 1  Tired, decreased energy 1  Change in appetite 1  Feeling bad or failure about yourself  1  Trouble concentrating 3  Moving slowly or fidgety/restless 0  Suicidal thoughts 0  PHQ-9 Score 9  Difficult doing work/chores Somewhat difficult   Interpretation of Total Score  Total Score Depression Severity:  1-4 = Minimal depression, 5-9 = Mild depression, 10-14 = Moderate depression, 15-19 = Moderately severe depression, 20-27 = Severe depression   Psychosocial Evaluation and Intervention:  Psychosocial Evaluation - 04/25/22 1421       Psychosocial Evaluation &  Interventions   Interventions Relaxation education;Encouraged to exercise with the program and follow exercise prescription;Stress management education    Comments Pt has no barriers to completing the CR program.  He has a history of depression/anxiety, but per the pt this is managed well with Venlafaxine.  His job is stressful due to traveling, but he is eager to start the program and get healthier, which he feels will help with stress management.  He does have a good support system with his friends.  He scored a 9 on his PHQ-9 due to his fatigue, sleep problems, concentration issues, and feeling bad about his weight gain.  He mentioned that he used to be in the [employer] and was in very good shape, so he is discouraged with his current weight.  He has been taking steps over the past few months to become healthier by exercising at home and eating healthier.  Pt is very eager to start the program to reach his goals of losing more weight and being healthier overall.    Expected Outcomes Depression/anxiety will continue to be managed with Venlafaxine, and the pt will continue to manage his stressors.  Pt will continue to have no identifiable psychosocial barriers.    Continue Psychosocial Services  No Follow up required             Psychosocial Re-Evaluation:   Psychosocial Discharge (Final Psychosocial Re-Evaluation):   Vocational Rehabilitation: Provide vocational rehab assistance to qualifying candidates.   Vocational Rehab Evaluation & Intervention:  Vocational Rehab - 04/25/22 1325       Initial Vocational Rehab Evaluation & Intervention   Assessment shows need for Vocational Rehabilitation No      Vocational Rehab Re-Evaulation   Comments pt has already returned to work             Education: Education Goals: Education classes will be provided on a weekly basis, covering required topics. Participant will state understanding/return demonstration of topics  presented.  Learning Barriers/Preferences:  Learning Barriers/Preferences - 04/25/22 1314       Learning Barriers/Preferences   Learning Barriers Hearing    Learning Preferences Skilled Demonstration  Education Topics: Hypertension, Hypertension Reduction -Define heart disease and high blood pressure. Discus how high blood pressure affects the body and ways to reduce high blood pressure.   Exercise and Your Heart -Discuss why it is important to exercise, the FITT principles of exercise, normal and abnormal responses to exercise, and how to exercise safely.   Angina -Discuss definition of angina, causes of angina, treatment of angina, and how to decrease risk of having angina.   Cardiac Medications -Review what the following cardiac medications are used for, how they affect the body, and side effects that may occur when taking the medications.  Medications include Aspirin, Beta blockers, calcium channel blockers, ACE Inhibitors, angiotensin receptor blockers, diuretics, digoxin, and antihyperlipidemics.   Congestive Heart Failure -Discuss the definition of CHF, how to live with CHF, the signs and symptoms of CHF, and how keep track of weight and sodium intake.   Heart Disease and Intimacy -Discus the effect sexual activity has on the heart, how changes occur during intimacy as we age, and safety during sexual activity.   Smoking Cessation / COPD -Discuss different methods to quit smoking, the health benefits of quitting smoking, and the definition of COPD.   Nutrition I: Fats -Discuss the types of cholesterol, what cholesterol does to the heart, and how cholesterol levels can be controlled.   Nutrition II: Labels -Discuss the different components of food labels and how to read food label   Heart Parts/Heart Disease and PAD -Discuss the anatomy of the heart, the pathway of blood circulation through the heart, and these are affected by heart  disease.   Stress I: Signs and Symptoms -Discuss the causes of stress, how stress may lead to anxiety and depression, and ways to limit stress.   Stress II: Relaxation -Discuss different types of relaxation techniques to limit stress.   Warning Signs of Stroke / TIA -Discuss definition of a stroke, what the signs and symptoms are of a stroke, and how to identify when someone is having stroke.   Knowledge Questionnaire Score:  Knowledge Questionnaire Score - 04/25/22 1316       Knowledge Questionnaire Score   Pre Score 21/24             Core Components/Risk Factors/Patient Goals at Admission:  Personal Goals and Risk Factors at Admission - 04/25/22 1326       Core Components/Risk Factors/Patient Goals on Admission    Weight Management Yes;Weight Loss    Intervention Weight Management: Develop a combined nutrition and exercise program designed to reach desired caloric intake, while maintaining appropriate intake of nutrient and fiber, sodium and fats, and appropriate energy expenditure required for the weight goal.;Weight Management: Provide education and appropriate resources to help participant work on and attain dietary goals.;Weight Management/Obesity: Establish reasonable short term and long term weight goals.;Obesity: Provide education and appropriate resources to help participant work on and attain dietary goals.    Improve shortness of breath with ADL's Yes    Intervention Provide education, individualized exercise plan and daily activity instruction to help decrease symptoms of SOB with activities of daily living.    Expected Outcomes Short Term: Improve cardiorespiratory fitness to achieve a reduction of symptoms when performing ADLs;Long Term: Be able to perform more ADLs without symptoms or delay the onset of symptoms    Diabetes Yes    Intervention Provide education about signs/symptoms and action to take for hypo/hyperglycemia.;Provide education about proper  nutrition, including hydration, and aerobic/resistive exercise prescription along with prescribed medications to achieve  blood glucose in normal ranges: Fasting glucose 65-99 mg/dL    Expected Outcomes Short Term: Participant verbalizes understanding of the signs/symptoms and immediate care of hyper/hypoglycemia, proper foot care and importance of medication, aerobic/resistive exercise and nutrition plan for blood glucose control.;Long Term: Attainment of HbA1C < 7%.    Lipids Yes    Intervention Provide education and support for participant on nutrition & aerobic/resistive exercise along with prescribed medications to achieve LDL '70mg'$ , HDL >'40mg'$ .    Expected Outcomes Short Term: Participant states understanding of desired cholesterol values and is compliant with medications prescribed. Participant is following exercise prescription and nutrition guidelines.;Long Term: Cholesterol controlled with medications as prescribed, with individualized exercise RX and with personalized nutrition plan. Value goals: LDL < '70mg'$ , HDL > 40 mg.    Stress Yes    Intervention Offer individual and/or small group education and counseling on adjustment to heart disease, stress management and health-related lifestyle change. Teach and support self-help strategies.;Refer participants experiencing significant psychosocial distress to appropriate mental health specialists for further evaluation and treatment. When possible, include family members and significant others in education/counseling sessions.    Expected Outcomes Short Term: Participant demonstrates changes in health-related behavior, relaxation and other stress management skills, ability to obtain effective social support, and compliance with psychotropic medications if prescribed.;Long Term: Emotional wellbeing is indicated by absence of clinically significant psychosocial distress or social isolation.    Personal Goal Other Yes    Personal Goal improve his overall  health    Intervention Pt will attend CR 3 times a week and continue to exercise at home.    Expected Outcomes Pt will attend classes and meet both personal and program goals.             Core Components/Risk Factors/Patient Goals Review:    Core Components/Risk Factors/Patient Goals at Discharge (Final Review):    ITP Comments:   Comments: Patient arrived for 1st visit/orientation/education at 12:30. Patient was referred to CR by Dr. Ali Lowe due to a NSTEMI and coronary artery stent placement (I21.4, Z95.5). During orientation advised patient on arrival and appointment times what to wear, what to do before, during and after exercise. Reviewed attendance and class policy.  Pt is scheduled to return Cardiac Rehab on 05/13/22 at 8:15. Pt was advised to come to class 15 minutes before class starts.  Discussed RPE/Dpysnea scales. Patient participated in warm up stretches. Patient was able to complete 6 minute walk test.  Telemetry:NSR.  Patient was measured for the equipment. Discussed equipment safety with patient. Took patient pre-anthropometric measurements. Patient finished visit at 14:00.

## 2022-05-08 NOTE — Progress Notes (Signed)
Cardiac Individual Treatment Plan  Patient Details  Name: Michael Sandoval MRN: [id number] Date of Birth: 04/03/1965 Referring Provider:   Flowsheet Row CARDIAC REHAB PHASE II ORIENTATION from 04/25/2022 in [facility]  Referring Provider Dr. Ali Lowe       Initial Encounter Date:  Flowsheet Row CARDIAC REHAB PHASE II ORIENTATION from 04/25/2022 in [facility]  Date 04/25/22       Visit Diagnosis: NSTEMI (non-ST elevated myocardial infarction) [facility])  Status post coronary artery stent placement  Patient's Home Medications on Admission:  Current Outpatient Medications:    aspirin EC 81 MG tablet, Take 81 mg by mouth daily. Swallow whole., Disp: , Rfl:    cetirizine (ZYRTEC) 10 MG tablet, Take 10 mg by mouth as needed for allergies., Disp: , Rfl:    clopidogrel (PLAVIX) 75 MG tablet, Take 1 tablet (75 mg total) by mouth daily., Disp: 90 tablet, Rfl: 3   ezetimibe (ZETIA) 10 MG tablet, Take 1 tablet (10 mg total) by mouth daily., Disp: 90 tablet, Rfl: 3   fluticasone (FLONASE) 50 MCG/ACT nasal spray, Place 1 spray into both nostrils daily as needed for allergies., Disp: , Rfl:    glucose blood test strip, 1 each by Other route as needed for other. Use as instructed One Touch Verio (Blood Glucose Test) One Touch Delica Lancets [id number], Disp: , Rfl:    metoprolol succinate (TOPROL XL) 25 MG 24 hr tablet, Take 0.5 tablets (12.5 mg total) by mouth daily., Disp: 45 tablet, Rfl: 3   MOUNJARO 2.5 MG/0.5ML Pen, Inject 2.5 mg into the skin once a week., Disp: , Rfl:    Multiple Vitamins-Minerals (MULTIVITAMIN WITH MINERALS) tablet, Take 1 tablet by mouth daily., Disp: , Rfl:    nitroGLYCERIN (NITROSTAT) 0.4 MG SL tablet, Place 1 tablet (0.4 mg total) under the tongue every 5 (five) minutes as needed for chest pain., Disp: 25 tablet, Rfl: 3   NOVOLIN 70/30 KWIKPEN (70-30) 100 UNIT/ML KwikPen, Inject 15 Units into the skin 2 (two) times daily., Disp: , Rfl:     omeprazole (PRILOSEC OTC) 20 MG tablet, Take 1 tablet by mouth daily as needed (for acid reflux)., Disp: , Rfl:    rosuvastatin (CRESTOR) 10 MG tablet, Take 1 tablet (10 mg total) by mouth daily., Disp: 90 tablet, Rfl: 3   venlafaxine XR (EFFEXOR-XR) 150 MG 24 hr capsule, Take 150 mg by mouth daily., Disp: , Rfl:    XIGDUO XR 06-998 MG TB24, Take 1 tablet by mouth 2 (two) times daily., Disp: , Rfl:   Past Medical History: Past Medical History:  Diagnosis Date   Anxiety    Arthritis    Cancer ([place])    melanoma skin cancer    Daytime somnolence    Decreased hearing    Depression    Diabetes mellitus, type II (HCC)        Dyslipidemia    Elevated blood pressure reading    Elevated coronary artery calcium score    Excessive daytime sleepiness    GERD (gastroesophageal reflux disease)    HTN (hypertension)    HTN (hypertension)    Hyperlipidemia    Lightheadedness    Non-restorative sleep    Obesity    Osteoarthritis of hip    Pneumonia    1990    Sinusitis    Unspecified disorder of nose and nasal sinuses     Tobacco Use: Social History   Tobacco Use  Smoking Status Never  Smokeless Tobacco Never  Labs: Review Flowsheet       Latest Ref Rng & Units 06/30/2018 08/20/2021 01/07/2022  Labs for ITP Cardiac and Pulmonary Rehab  Cholestrol 100 - 199 mg/dL - 231  102   LDL (calc) 0 - 99 mg/dL - 141  29   HDL-C >39 mg/dL - 48  51   Trlycerides 0 - 149 mg/dL - 232  128   Hemoglobin A1c 4.8 - 5.6 % 6.4  - -    Capillary Blood Glucose: Lab Results  Component Value Date   GLUCAP 155 (H) 09/12/2021   GLUCAP 163 (H) 09/12/2021   GLUCAP 196 (H) 09/11/2021   GLUCAP 215 (H) 09/11/2021   GLUCAP 275 (H) 09/11/2021     Exercise Target Goals: Exercise Program Goal: Individual exercise prescription set using results from initial 6 min walk test and THRR while considering  patient's activity barriers and safety.   Exercise Prescription Goal: Starting with aerobic activity  30 plus minutes a day, 3 days per week for initial exercise prescription. Provide home exercise prescription and guidelines that participant acknowledges understanding prior to discharge.  Activity Barriers & Risk Stratification:  Activity Barriers & Cardiac Risk Stratification - 04/25/22 1259       Activity Barriers & Cardiac Risk Stratification   Activity Barriers Joint Problems;Chest Pain/Angina;Balance Concerns;Right Hip Replacement;Deconditioning;History of Falls;Muscular Weakness;Shortness of Breath    Cardiac Risk Stratification High             6 Minute Walk:  6 Minute Walk     Row Name 04/25/22 1412         6 Minute Walk   Phase Initial     Distance 1525 feet     Walk Time 6 minutes     # of Rest Breaks 0     MPH 2.89     METS 3.47     RPE 11     VO2 Peak 12.15     Symptoms No     Resting HR 70 bpm     Resting BP 112/64     Resting Oxygen Saturation  97 %     Exercise Oxygen Saturation  during 6 min walk 99 %     Max Ex. HR 104 bpm     Max Ex. BP 142/74     2 Minute Post BP 124/70              Oxygen Initial Assessment:   Oxygen Re-Evaluation:   Oxygen Discharge (Final Oxygen Re-Evaluation):   Initial Exercise Prescription:  Initial Exercise Prescription - 04/25/22 1400       Date of Initial Exercise RX and Referring Provider   Date 04/25/22    Referring Provider Dr. Ali Lowe    Expected Discharge Date 07/12/22      Treadmill   MPH 2.4    Grade 0    Minutes 17      Recumbant Elliptical   Level 1    RPM 60    Minutes 22      Prescription Details   Frequency (times per week) 3    Duration Progress to 30 minutes of continuous aerobic without signs/symptoms of physical distress      Intensity   THRR 40-80% of Max Heartrate 65-130    Ratings of Perceived Exertion 11-13    Perceived Dyspnea 0-4      Resistance Training   Training Prescription Yes    Weight 4    Reps 10-15  Perform Capillary Blood Glucose  checks as needed.  Exercise Prescription Changes:   Exercise Comments:   Exercise Goals and Review:   Exercise Goals     Row Name 04/25/22 1414 05/06/22 1112           Exercise Goals   Increase Physical Activity Yes Yes      Intervention Provide advice, education, support and counseling about physical activity/exercise needs.;Develop an individualized exercise prescription for aerobic and resistive training based on initial evaluation findings, risk stratification, comorbidities and participant's personal goals. Provide advice, education, support and counseling about physical activity/exercise needs.;Develop an individualized exercise prescription for aerobic and resistive training based on initial evaluation findings, risk stratification, comorbidities and participant's personal goals.      Expected Outcomes Short Term: Attend rehab on a regular basis to increase amount of physical activity.;Long Term: Add in home exercise to make exercise part of routine and to increase amount of physical activity.;Long Term: Exercising regularly at least 3-5 days a week. Short Term: Attend rehab on a regular basis to increase amount of physical activity.;Long Term: Add in home exercise to make exercise part of routine and to increase amount of physical activity.;Long Term: Exercising regularly at least 3-5 days a week.      Increase Strength and Stamina Yes Yes      Intervention Provide advice, education, support and counseling about physical activity/exercise needs.;Develop an individualized exercise prescription for aerobic and resistive training based on initial evaluation findings, risk stratification, comorbidities and participant's personal goals. Provide advice, education, support and counseling about physical activity/exercise needs.;Develop an individualized exercise prescription for aerobic and resistive training based on initial evaluation findings, risk stratification, comorbidities and  participant's personal goals.      Expected Outcomes Short Term: Increase workloads from initial exercise prescription for resistance, speed, and METs.;Short Term: Perform resistance training exercises routinely during rehab and add in resistance training at home;Long Term: Improve cardiorespiratory fitness, muscular endurance and strength as measured by increased METs and functional capacity (6MWT) Short Term: Increase workloads from initial exercise prescription for resistance, speed, and METs.;Short Term: Perform resistance training exercises routinely during rehab and add in resistance training at home;Long Term: Improve cardiorespiratory fitness, muscular endurance and strength as measured by increased METs and functional capacity (6MWT)      Able to understand and use rate of perceived exertion (RPE) scale Yes Yes      Intervention Provide education and explanation on how to use RPE scale Provide education and explanation on how to use RPE scale      Expected Outcomes Short Term: Able to use RPE daily in rehab to express subjective intensity level;Long Term:  Able to use RPE to guide intensity level when exercising independently Short Term: Able to use RPE daily in rehab to express subjective intensity level;Long Term:  Able to use RPE to guide intensity level when exercising independently      Knowledge and understanding of Target Heart Rate Range (THRR) Yes Yes      Intervention Provide education and explanation of THRR including how the numbers were predicted and where they are located for reference Provide education and explanation of THRR including how the numbers were predicted and where they are located for reference      Expected Outcomes Short Term: Able to state/look up THRR;Long Term: Able to use THRR to govern intensity when exercising independently;Short Term: Able to use daily as guideline for intensity in rehab Short Term: Able to state/look up THRR;Long Term: Able to  use THRR to govern  intensity when exercising independently;Short Term: Able to use daily as guideline for intensity in rehab      Able to check pulse independently Yes Yes      Intervention Provide education and demonstration on how to check pulse in carotid and radial arteries.;Review the importance of being able to check your own pulse for safety during independent exercise Provide education and demonstration on how to check pulse in carotid and radial arteries.;Review the importance of being able to check your own pulse for safety during independent exercise      Expected Outcomes Long Term: Able to check pulse independently and accurately;Short Term: Able to explain why pulse checking is important during independent exercise Long Term: Able to check pulse independently and accurately;Short Term: Able to explain why pulse checking is important during independent exercise      Understanding of Exercise Prescription Yes Yes      Intervention Provide education, explanation, and written materials on patient's individual exercise prescription Provide education, explanation, and written materials on patient's individual exercise prescription      Expected Outcomes Short Term: Able to explain program exercise prescription;Long Term: Able to explain home exercise prescription to exercise independently Short Term: Able to explain program exercise prescription;Long Term: Able to explain home exercise prescription to exercise independently               Exercise Goals Re-Evaluation :  Exercise Goals Re-Evaluation     Row Name 05/06/22 1113             Exercise Goal Re-Evaluation   Exercise Goals Review Increase Physical Activity;Increase Strength and Stamina;Able to understand and use rate of perceived exertion (RPE) scale;Knowledge and understanding of Target Heart Rate Range (THRR);Able to check pulse independently;Understanding of Exercise Prescription       Comments Pt completed orientation on 04/25/2022, and he  will begin the program on 05/13/2022. Unable to assess progress at this time.       Expected Outcomes Through exercise at rehab and at home, the patient will meet their stated goals.                 Discharge Exercise Prescription (Final Exercise Prescription Changes):   Nutrition:  Target Goals: Understanding of nutrition guidelines, daily intake of sodium 1500mg , cholesterol 200mg , calories 30% from fat and 7% or less from saturated fats, daily to have 5 or more servings of fruits and vegetables.  Biometrics:  Pre Biometrics - 04/25/22 1415       Pre Biometrics   Height 5\' 9"  (1.753 m)    Weight 116.2 kg    Waist Circumference 49 inches    Hip Circumference 46 inches    Waist to Hip Ratio 1.07 %    BMI (Calculated) 37.81    Triceps Skinfold 34 mm    % Body Fat 38.6 %    Grip Strength 46.6 kg    Flexibility 0 in    Single Leg Stand 15 seconds              Nutrition Therapy Plan and Nutrition Goals:  Nutrition Therapy & Goals - 04/25/22 1452       Personal Nutrition Goals   Comments Patient scored 104 on his diet assessment. We have educational sessions on heart heathy nutrition with handouts and offer assistance with RD referral if patient is interested.      Intervention Plan   Intervention Nutrition handout(s) given to patient.    Expected Outcomes  Short Term Goal: Understand basic principles of dietary content, such as calories, fat, sodium, cholesterol and nutrients.             Nutrition Assessments:  Nutrition Assessments - 04/25/22 1311       MEDFICTS Scores   Pre Score 104            MEDIFICTS Score Key: ?70 Need to make dietary changes  40-70 Heart Healthy Diet ? 40 Therapeutic Level Cholesterol Diet   Picture Your Plate Scores: [id number] Unhealthy dietary pattern with much room for improvement. 41-50 Dietary pattern unlikely to meet recommendations for good health and room for improvement. 51-60 More healthful dietary pattern, with  some room for improvement.  >60 Healthy dietary pattern, although there may be some specific behaviors that could be improved.    Nutrition Goals Re-Evaluation:   Nutrition Goals Discharge (Final Nutrition Goals Re-Evaluation):   Psychosocial: Target Goals: Acknowledge presence or absence of significant depression and/or stress, maximize coping skills, provide positive support system. Participant is able to verbalize types and ability to use techniques and skills needed for reducing stress and depression.  Initial Review & Psychosocial Screening:  Initial Psych Review & Screening - 04/25/22 1303       Initial Review   Current issues with Current Sleep Concerns;Current Stress Concerns;Current Anxiety/Panic;Current Psychotropic Meds    Source of Stress Concerns Occupation;Chronic Illness    Comments pt is having to travel now for his job      [place]? Yes    Comments Herbie [place] and Darryl Nestle (friends)      Barriers   Psychosocial barriers to participate in program There are no identifiable barriers or psychosocial needs.      Screening Interventions   Interventions Encouraged to exercise;Provide feedback about the scores to participant    Expected Outcomes Short Term goal: Identification and review with participant of any Quality of Life or Depression concerns found by scoring the questionnaire.;Long Term goal: The participant improves quality of Life and PHQ9 Scores as seen by post scores and/or verbalization of changes;Long Term Goal: Stressors or current issues are controlled or eliminated.             Quality of Life Scores:  Quality of Life - 04/25/22 1411       Quality of Life   Select Quality of Life      Quality of Life Scores   Health/Function Pre 15.43 %    Socioeconomic Pre 26.56 %    Psych/Spiritual Pre 18.79 %    Family Pre 22.5 %    GLOBAL Pre 19.52 %            Scores of 19 and below usually indicate a poorer quality  of life in these areas.  A difference of  2-3 points is a clinically meaningful difference.  A difference of 2-3 points in the total score of the Quality of Life Index has been associated with significant improvement in overall quality of life, self-image, physical symptoms, and general health in studies assessing change in quality of life.  PHQ-9: Review Flowsheet       04/25/2022  Depression screen PHQ 2/9  Decreased Interest 2  Down, Depressed, Hopeless 0  PHQ - 2 Score 2  Altered sleeping 1  Tired, decreased energy 1  Change in appetite 1  Feeling bad or failure about yourself  1  Trouble concentrating 3  Moving slowly or fidgety/restless 0  Suicidal thoughts 0  PHQ-9  Score 9  Difficult doing work/chores Somewhat difficult   Interpretation of Total Score  Total Score Depression Severity:  1-4 = Minimal depression, 5-9 = Mild depression, 10-14 = Moderate depression, 15-19 = Moderately severe depression, 20-27 = Severe depression   Psychosocial Evaluation and Intervention:  Psychosocial Evaluation - 04/25/22 1421       Psychosocial Evaluation & Interventions   Interventions Relaxation education;Encouraged to exercise with the program and follow exercise prescription;Stress management education    Comments Pt has no barriers to completing the CR program.  He has a history of depression/anxiety, but per the pt this is managed well with Venlafaxine.  His job is stressful due to traveling, but he is eager to start the program and get healthier, which he feels will help with stress management.  He does have a good support system with his friends.  He scored a 9 on his PHQ-9 due to his fatigue, sleep problems, concentration issues, and feeling bad about his weight gain.  He mentioned that he used to be in the [employer] and was in very good shape, so he is discouraged with his current weight.  He has been taking steps over the past few months to become healthier by exercising at home and  eating healthier.  Pt is very eager to start the program to reach his goals of losing more weight and being healthier overall.    Expected Outcomes Depression/anxiety will continue to be managed with Venlafaxine, and the pt will continue to manage his stressors.  Pt will continue to have no identifiable psychosocial barriers.    Continue Psychosocial Services  No Follow up required             Psychosocial Re-Evaluation:  Psychosocial Re-Evaluation     [place] Name 04/29/22 1435             Psychosocial Re-Evaluation   Current issues with Current Psychotropic Meds;Current Stress Concerns;Current Sleep Concerns;Current Anxiety/Panic;Current Depression       Comments Patient has not started the program. He plans to start in April due to his work schedule. We will continue to montior his progress.       Expected Outcomes Patient will continue to have no psychosocial barriers identified and his Depression/anxiety/stress will continue to be managed with Venlafazxine.       Interventions Stress management education;Encouraged to attend Cardiac Rehabilitation for the exercise;Relaxation education       Continue Psychosocial Services  No Follow up required         Initial Review   Source of Stress Concerns Occupation;Chronic Illness       Comments pt is having to travel now for his job                Psychosocial Discharge (Final Psychosocial Re-Evaluation):  Psychosocial Re-Evaluation - 04/29/22 1435       Psychosocial Re-Evaluation   Current issues with Current Psychotropic Meds;Current Stress Concerns;Current Sleep Concerns;Current Anxiety/Panic;Current Depression    Comments Patient has not started the program. He plans to start in April due to his work schedule. We will continue to montior his progress.    Expected Outcomes Patient will continue to have no psychosocial barriers identified and his Depression/anxiety/stress will continue to be managed with Venlafazxine.     Interventions Stress management education;Encouraged to attend Cardiac Rehabilitation for the exercise;Relaxation education    Continue Psychosocial Services  No Follow up required      Initial Review   Source of Stress Concerns Occupation;Chronic Illness  Comments pt is having to travel now for his job             Vocational Rehabilitation: Provide vocational rehab assistance to qualifying candidates.   Vocational Rehab Evaluation & Intervention:  Vocational Rehab - 04/25/22 1325       Initial Vocational Rehab Evaluation & Intervention   Assessment shows need for Vocational Rehabilitation No      Vocational Rehab Re-Evaulation   Comments pt has already returned to work             Education: Education Goals: Education classes will be provided on a weekly basis, covering required topics. Participant will state understanding/return demonstration of topics presented.  Learning Barriers/Preferences:  Learning Barriers/Preferences - 04/25/22 1314       Learning Barriers/Preferences   Learning Barriers Hearing    Learning Preferences Skilled Demonstration             Education Topics: Hypertension, Hypertension Reduction -Define heart disease and high blood pressure. Discus how high blood pressure affects the body and ways to reduce high blood pressure.   Exercise and Your Heart -Discuss why it is important to exercise, the FITT principles of exercise, normal and abnormal responses to exercise, and how to exercise safely.   Angina -Discuss definition of angina, causes of angina, treatment of angina, and how to decrease risk of having angina.   Cardiac Medications -Review what the following cardiac medications are used for, how they affect the body, and side effects that may occur when taking the medications.  Medications include Aspirin, Beta blockers, calcium channel blockers, ACE Inhibitors, angiotensin receptor blockers, diuretics, digoxin, and  antihyperlipidemics.   Congestive Heart Failure -Discuss the definition of CHF, how to live with CHF, the signs and symptoms of CHF, and how keep track of weight and sodium intake.   Heart Disease and Intimacy -Discus the effect sexual activity has on the heart, how changes occur during intimacy as we age, and safety during sexual activity.   Smoking Cessation / COPD -Discuss different methods to quit smoking, the health benefits of quitting smoking, and the definition of COPD.   Nutrition I: Fats -Discuss the types of cholesterol, what cholesterol does to the heart, and how cholesterol levels can be controlled.   Nutrition II: Labels -Discuss the different components of food labels and how to read food label   Heart Parts/Heart Disease and PAD -Discuss the anatomy of the heart, the pathway of blood circulation through the heart, and these are affected by heart disease.   Stress I: Signs and Symptoms -Discuss the causes of stress, how stress may lead to anxiety and depression, and ways to limit stress.   Stress II: Relaxation -Discuss different types of relaxation techniques to limit stress.   Warning Signs of Stroke / TIA -Discuss definition of a stroke, what the signs and symptoms are of a stroke, and how to identify when someone is having stroke.   Knowledge Questionnaire Score:  Knowledge Questionnaire Score - 04/25/22 1316       Knowledge Questionnaire Score   Pre Score 21/24             Core Components/Risk Factors/Patient Goals at Admission:  Personal Goals and Risk Factors at Admission - 04/25/22 1326       Core Components/Risk Factors/Patient Goals on Admission    Weight Management Yes;Weight Loss    Intervention Weight Management: Develop a combined nutrition and exercise program designed to reach desired caloric intake, while maintaining appropriate intake of  nutrient and fiber, sodium and fats, and appropriate energy expenditure required for the  weight goal.;Weight Management: Provide education and appropriate resources to help participant work on and attain dietary goals.;Weight Management/Obesity: Establish reasonable short term and long term weight goals.;Obesity: Provide education and appropriate resources to help participant work on and attain dietary goals.    Improve shortness of breath with ADL's Yes    Intervention Provide education, individualized exercise plan and daily activity instruction to help decrease symptoms of SOB with activities of daily living.    Expected Outcomes Short Term: Improve cardiorespiratory fitness to achieve a reduction of symptoms when performing ADLs;Long Term: Be able to perform more ADLs without symptoms or delay the onset of symptoms    Diabetes Yes    Intervention Provide education about signs/symptoms and action to take for hypo/hyperglycemia.;Provide education about proper nutrition, including hydration, and aerobic/resistive exercise prescription along with prescribed medications to achieve blood glucose in normal ranges: Fasting glucose 65-99 mg/dL    Expected Outcomes Short Term: Participant verbalizes understanding of the signs/symptoms and immediate care of hyper/hypoglycemia, proper foot care and importance of medication, aerobic/resistive exercise and nutrition plan for blood glucose control.;Long Term: Attainment of HbA1C < 7%.    Lipids Yes    Intervention Provide education and support for participant on nutrition & aerobic/resistive exercise along with prescribed medications to achieve LDL 70mg , HDL >40mg .    Expected Outcomes Short Term: Participant states understanding of desired cholesterol values and is compliant with medications prescribed. Participant is following exercise prescription and nutrition guidelines.;Long Term: Cholesterol controlled with medications as prescribed, with individualized exercise RX and with personalized nutrition plan. Value goals: LDL < 70mg , HDL > 40 mg.     Stress Yes    Intervention Offer individual and/or small group education and counseling on adjustment to heart disease, stress management and health-related lifestyle change. Teach and support self-help strategies.;Refer participants experiencing significant psychosocial distress to appropriate mental health specialists for further evaluation and treatment. When possible, include family members and significant others in education/counseling sessions.    Expected Outcomes Short Term: Participant demonstrates changes in health-related behavior, relaxation and other stress management skills, ability to obtain effective social support, and compliance with psychotropic medications if prescribed.;Long Term: Emotional wellbeing is indicated by absence of clinically significant psychosocial distress or social isolation.    Personal Goal Other Yes    Personal Goal improve his overall health    Intervention Pt will attend CR 3 times a week and continue to exercise at home.    Expected Outcomes Pt will attend classes and meet both personal and program goals.             Core Components/Risk Factors/Patient Goals Review:   Goals and Risk Factor Review     Row Name 04/29/22 1437             Core Components/Risk Factors/Patient Goals Review   Personal Goals Review Weight Management/Obesity;Improve shortness of breath with ADL's;Diabetes;Lipids;Other;Stress       Review Patient was referred to CR with NSTEMI. He has multiple risk factors for CAD and is participating in the program for risk modification. He plans to start the program in April. His DM is managed with Insulin and mounjaro. His last A1C on file was 09/05/21 at 10.8%. His personal goals for the program are to lose weight and get healthier overall. We will continue to monitor his progress as he works towards meeting these goals.       Expected Outcomes Patient  will complete the program meeting both personal and program goals.                 Core Components/Risk Factors/Patient Goals at Discharge (Final Review):   Goals and Risk Factor Review - 04/29/22 1437       Core Components/Risk Factors/Patient Goals Review   Personal Goals Review Weight Management/Obesity;Improve shortness of breath with ADL's;Diabetes;Lipids;Other;Stress    Review Patient was referred to CR with NSTEMI. He has multiple risk factors for CAD and is participating in the program for risk modification. He plans to start the program in April. His DM is managed with Insulin and mounjaro. His last A1C on file was 09/05/21 at 10.8%. His personal goals for the program are to lose weight and get healthier overall. We will continue to monitor his progress as he works towards meeting these goals.    Expected Outcomes Patient will complete the program meeting both personal and program goals.             ITP Comments:   Comments: ITP REVIEW Patient plans to start the program in April. We will monitor his progress when he starts.

## 2022-05-13 ENCOUNTER — Encounter (HOSPITAL_COMMUNITY)
Admission: RE | Admit: 2022-05-13 | Discharge: 2022-05-13 | Disposition: A | Discharge status: Home / Self Care | Payer: 59 | Source: Ambulatory Visit | Attending: Internal Medicine | Admitting: Internal Medicine

## 2022-05-13 DIAGNOSIS — Z955 Presence of coronary angioplasty implant and graft: Secondary | ICD-10-CM | POA: Diagnosis present

## 2022-05-13 DIAGNOSIS — I214 Non-ST elevation (NSTEMI) myocardial infarction: Secondary | ICD-10-CM | POA: Diagnosis not present

## 2022-05-13 NOTE — Progress Notes (Signed)
Daily Session Note  Patient Details  Name: Michael Sandoval MRN: [id number] Date of Birth: [birth year] Referring Provider:   Flowsheet Row CARDIAC REHAB PHASE II ORIENTATION from 04/25/2022 in [place]  Referring Provider Dr. Ali Lowe       Encounter Date: 05/13/2022  Check In:  Session Check In - 05/13/22 0815       Check-In   Supervising physician immediately available to respond to emergencies [facility] MD immediately available    Physician(s) Dr. Dellia Cloud    Location [facility]    Staff Present Hoy Register MHA, MS, ACSM-CEP;Madelyn Flavors, RN, BSN;Heather Mel Almond, BS, Exercise Physiologist    Virtual Visit No    Medication changes reported     No    Fall or balance concerns reported    Yes    Comments pt had 1 fall in the last year, but he is having lightheadedness/dizziness intermittently when he stands up or with activity    Tobacco Cessation No Change    Warm-up and Cool-down Performed as group-led instruction    Resistance Training Performed Yes    VAD Patient? No    PAD/SET Patient? No      Pain Assessment   Currently in Pain? No/denies    Multiple Pain Sites No             Capillary Blood Glucose: No results found for this or any previous visit (from the past 24 hour(s)).    Social History   Tobacco Use  Smoking Status Never  Smokeless Tobacco Never    Goals Met:  Independence with exercise equipment Exercise tolerated well No report of concerns or symptoms today Strength training completed today  Goals Unmet:  Not Applicable  Comments: checkout time is 0915   Dr. Carlyle Dolly is Medical Director for [place]

## 2022-05-15 ENCOUNTER — Encounter (HOSPITAL_COMMUNITY)
Admission: RE | Admit: 2022-05-15 | Discharge: 2022-05-15 | Disposition: A | Discharge status: Home / Self Care | Payer: 59 | Source: Ambulatory Visit | Attending: Internal Medicine | Admitting: Internal Medicine

## 2022-05-15 DIAGNOSIS — I214 Non-ST elevation (NSTEMI) myocardial infarction: Secondary | ICD-10-CM | POA: Diagnosis not present

## 2022-05-15 DIAGNOSIS — Z955 Presence of coronary angioplasty implant and graft: Secondary | ICD-10-CM

## 2022-05-15 NOTE — Progress Notes (Signed)
Daily Session Note  Patient Details  Name: Michael Sandoval MRN: [id number] Date of Birth: 02/15/1965 Referring Provider:   Flowsheet Row CARDIAC REHAB PHASE II ORIENTATION from 04/25/2022 in [place]  Referring Provider Dr. Ali Lowe       Encounter Date: 05/15/2022  Check In:  Session Check In - 05/15/22 0815       Check-In   Supervising physician immediately available to respond to emergencies [facility] immediately available    Physician(s) Dr. Dellia Cloud    Location [facility]    Staff Present Hoy Register MHA, MS, ACSM-CEP;[employer] BSN, RN;Heather Mel Almond, [place], Exercise Physiologist    Virtual Visit No    Medication changes reported     No    Fall or balance concerns reported    Yes    Comments pt had 1 fall in the last year, but he is having lightheadedness/dizziness intermittently when he stands up or with activity    Tobacco Cessation No Change    Warm-up and Cool-down Performed as group-led instruction    Resistance Training Performed Yes    VAD Patient? No    PAD/SET Patient? No      Pain Assessment   Currently in Pain? No/denies    Multiple Pain Sites No             Capillary Blood Glucose: No results found for this or any previous visit (from the past 24 hour(s)).    Social History   Tobacco Use  Smoking Status Never  Smokeless Tobacco Never    Goals Met:  Independence with exercise equipment Exercise tolerated well No report of concerns or symptoms today Strength training completed today  Goals Unmet:  Not Applicable  Comments: checkout time is 0915   Dr. Carlyle Dolly is Medical Director for [place]

## 2022-05-17 ENCOUNTER — Encounter (HOSPITAL_COMMUNITY)
Admission: RE | Admit: 2022-05-17 | Discharge: 2022-05-17 | Disposition: A | Discharge status: Home / Self Care | Payer: 59 | Source: Ambulatory Visit | Attending: Internal Medicine | Admitting: Internal Medicine

## 2022-05-17 DIAGNOSIS — I214 Non-ST elevation (NSTEMI) myocardial infarction: Secondary | ICD-10-CM

## 2022-05-17 DIAGNOSIS — Z955 Presence of coronary angioplasty implant and graft: Secondary | ICD-10-CM

## 2022-05-17 NOTE — Progress Notes (Signed)
Daily Session Note  Patient Details  Name: Michael Sandoval MRN: [id number] Date of Birth: 10-26-1965 Referring Provider:   Flowsheet Row CARDIAC REHAB PHASE II ORIENTATION from 04/25/2022 in [facility] CARDIAC REHABILITATION  Referring Provider Dr. Lynnette Caffey       Encounter Date: 05/17/2022  Check In:  Session Check In - 05/17/22 0815       Check-In   Supervising physician immediately available to respond to emergencies [facility] immediately available    Physician(s) Dr. Jenene Slicker    Location [facility]    Staff Present Cristopher Peru MHA, MS, ACSM-CEP;Ross Ludwig, BS, Exercise Physiologist;Herchel Hopkin, RN;Daphyne Daphine Deutscher, RN, Neal Dy, RN, BSN    Virtual Visit No    Medication changes reported     No    Fall or balance concerns reported    Yes    Comments pt had 1 fall in the last year, but he is having lightheadedness/dizziness intermittently when he stands up or with activity    Tobacco Cessation No Change    Warm-up and Cool-down Performed as group-led instruction    Resistance Training Performed Yes    VAD Patient? No    PAD/SET Patient? No      Pain Assessment   Currently in Pain? No/denies    Multiple Pain Sites No             Capillary Blood Glucose: No results found for this or any previous visit (from the past 24 hour(s)).    Social History   Tobacco Use  Smoking Status Never  Smokeless Tobacco Never    Goals Met:  Independence with exercise equipment Exercise tolerated well No report of concerns or symptoms today Strength training completed today  Goals Unmet:  Not Applicable  Comments: check out @ 9:15am   Dr. Dina Rich is Medical Director for [facility] Cardiac Re[facility]

## 2022-05-20 ENCOUNTER — Encounter (HOSPITAL_COMMUNITY)
Admission: RE | Admit: 2022-05-20 | Discharge: 2022-05-20 | Disposition: A | Discharge status: Home / Self Care | Payer: 59 | Source: Ambulatory Visit | Attending: Internal Medicine | Admitting: Internal Medicine

## 2022-05-20 VITALS — Wt 254.4 lb

## 2022-05-20 DIAGNOSIS — I214 Non-ST elevation (NSTEMI) myocardial infarction: Secondary | ICD-10-CM | POA: Diagnosis not present

## 2022-05-20 DIAGNOSIS — Z955 Presence of coronary angioplasty implant and graft: Secondary | ICD-10-CM

## 2022-05-20 NOTE — Progress Notes (Signed)
Daily Session Note  Patient Details  Name: Michael Sandoval MRN: [id number] Date of Birth: Jul 19, 1965 Referring Provider:   Flowsheet Row CARDIAC REHAB PHASE II ORIENTATION from 04/25/2022 in [facility] CARDIAC REHABILITATION  Referring Provider Dr. Lynnette Caffey       Encounter Date: 05/20/2022  Check In:  Session Check In - 05/20/22 0815       Check-In   Supervising physician immediately available to respond to emergencies [facility] immediately available    Physician(s) Dr. Wyline Mood    Location [facility]    Staff Present Ross Ludwig, BS, Exercise Physiologist;Dalton Debbe Mounts, MS, ACSM-CEP;Staci Righter, RN, BSN    Virtual Visit No    Medication changes reported     No    Fall or balance concerns reported    Yes    Comments pt had 1 fall in the last year, but he is having lightheadedness/dizziness intermittently when he stands up or with activity    Tobacco Cessation No Change    Warm-up and Cool-down Performed as group-led instruction    Resistance Training Performed Yes    VAD Patient? No    PAD/SET Patient? No      Pain Assessment   Currently in Pain? No/denies    Multiple Pain Sites No             Capillary Blood Glucose: No results found for this or any previous visit (from the past 24 hour(s)).    Social History   Tobacco Use  Smoking Status Never  Smokeless Tobacco Never    Goals Met:  Independence with exercise equipment Exercise tolerated well No report of concerns or symptoms today Strength training completed today  Goals Unmet:  Not Applicable  Comments: check out 0915   Dr. Dina Rich is Medical Director for [facility] Cardiac Rehab

## 2022-05-22 ENCOUNTER — Encounter (HOSPITAL_COMMUNITY)
Admission: RE | Admit: 2022-05-22 | Discharge: 2022-05-22 | Disposition: A | Discharge status: Home / Self Care | Payer: 59 | Source: Ambulatory Visit | Attending: Internal Medicine | Admitting: Internal Medicine

## 2022-05-22 DIAGNOSIS — I214 Non-ST elevation (NSTEMI) myocardial infarction: Secondary | ICD-10-CM | POA: Diagnosis not present

## 2022-05-22 DIAGNOSIS — Z955 Presence of coronary angioplasty implant and graft: Secondary | ICD-10-CM

## 2022-05-22 NOTE — Progress Notes (Signed)
Daily Session Note  Patient Details  Name: Michael Sandoval MRN: [id number] Date of Birth: 1965-03-16 Referring Provider:   Flowsheet Row CARDIAC REHAB PHASE II ORIENTATION from 04/25/2022 in [facility] CARDIAC REHABILITATION  Referring Provider Dr. Lynnette Caffey       Encounter Date: 05/22/2022  Check In:  Session Check In - 05/22/22 0815       Check-In   Supervising physician immediately available to respond to emergencies [facility] MD immediately available    Physician(s) Dr. Wyline Mood    Location [facility]    Staff Present Ross Ludwig, BS, Exercise Physiologist;Izella Ybanez Laural Benes, RN, Pleas Koch, RN, BSN    Virtual Visit No    Medication changes reported     No    Fall or balance concerns reported    Yes    Comments pt had 1 fall in the last year, but he is having lightheadedness/dizziness intermittently when he stands up or with activity    Tobacco Cessation No Change    Warm-up and Cool-down Performed as group-led instruction    Resistance Training Performed Yes    VAD Patient? No    PAD/SET Patient? No      Pain Assessment   Currently in Pain? No/denies    Multiple Pain Sites No             Capillary Blood Glucose: No results found for this or any previous visit (from the past 24 hour(s)).    Social History   Tobacco Use  Smoking Status Never  Smokeless Tobacco Never    Goals Met:  Independence with exercise equipment Exercise tolerated well No report of concerns or symptoms today Strength training completed today  Goals Unmet:  Not Applicable  Comments: Check out 915.   Dr. Dina Rich is Medical Director for [facility] Cardiac Rehab

## 2022-05-24 ENCOUNTER — Encounter (HOSPITAL_COMMUNITY)
Admission: RE | Admit: 2022-05-24 | Discharge: 2022-05-24 | Disposition: A | Discharge status: Home / Self Care | Payer: 59 | Source: Ambulatory Visit | Attending: Internal Medicine | Admitting: Internal Medicine

## 2022-05-24 DIAGNOSIS — Z955 Presence of coronary angioplasty implant and graft: Secondary | ICD-10-CM

## 2022-05-24 DIAGNOSIS — I214 Non-ST elevation (NSTEMI) myocardial infarction: Secondary | ICD-10-CM | POA: Diagnosis not present

## 2022-05-24 NOTE — Progress Notes (Signed)
Daily Session Note  Patient Details  Name: Michael Sandoval MRN: [id number] Date of Birth: 05-Jun-1965 Referring Provider:   Flowsheet Row CARDIAC REHAB PHASE II ORIENTATION from 04/25/2022 in [facility] CARDIAC REHABILITATION  Referring Provider Dr. Lynnette Caffey       Encounter Date: 05/24/2022  Check In:  Session Check In - 05/24/22 0815       Check-In   Supervising physician immediately available to respond to emergencies [facility] MD immediately available    Physician(s) Dr. Wyline Mood    Location [facility]    Staff Present Ross Ludwig, BS, Exercise Physiologist;Estalee Mccandlish Laural Benes, RN, Pleas Koch, RN, BSN    Virtual Visit No    Medication changes reported     No    Fall or balance concerns reported    Yes    Comments pt had 1 fall in the last year, but he is having lightheadedness/dizziness intermittently when he stands up or with activity    Tobacco Cessation No Change    Warm-up and Cool-down Performed as group-led instruction    VAD Patient? No    PAD/SET Patient? No      Pain Assessment   Currently in Pain? No/denies    Multiple Pain Sites No             Capillary Blood Glucose: No results found for this or any previous visit (from the past 24 hour(s)).    Social History   Tobacco Use  Smoking Status Never  Smokeless Tobacco Never    Goals Met:  Independence with exercise equipment Exercise tolerated well No report of concerns or symptoms today Strength training completed today  Goals Unmet:  Not Applicable  Comments: Check out 915.   Dr. Dina Rich is Medical Director for [facility] Cardiac Rehab

## 2022-05-27 ENCOUNTER — Encounter (HOSPITAL_COMMUNITY): Payer: 59

## 2022-05-29 ENCOUNTER — Encounter (HOSPITAL_COMMUNITY)
Admission: RE | Admit: 2022-05-29 | Discharge: 2022-05-29 | Disposition: A | Discharge status: Home / Self Care | Payer: 59 | Source: Ambulatory Visit | Attending: Internal Medicine | Admitting: Internal Medicine

## 2022-05-29 DIAGNOSIS — Z955 Presence of coronary angioplasty implant and graft: Secondary | ICD-10-CM

## 2022-05-29 DIAGNOSIS — I214 Non-ST elevation (NSTEMI) myocardial infarction: Secondary | ICD-10-CM | POA: Diagnosis not present

## 2022-05-29 NOTE — Progress Notes (Addendum)
Daily Session Note  Patient Details  Name: Michael Sandoval MRN: [id number] Date of Birth: 15-Nov-1965 Referring Provider:   Flowsheet Row CARDIAC REHAB PHASE II ORIENTATION from 04/25/2022 in [facility] CARDIAC REHABILITATION  Referring Provider Dr. Lynnette Caffey       Encounter Date: 05/29/2022  Check In:  Session Check In - 05/29/22 0815       Check-In   Supervising physician immediately available to respond to emergencies [facility] MD immediately available    Physician(s) Dr. Diona Browner    Location [facility]    Staff Present Ross Ludwig, BS, Exercise Physiologist;Floyd Wade Laural Benes, RN, BSN;Hillary Troutman BSN, RN    Virtual Visit No    Medication changes reported     No    Fall or balance concerns reported    Yes    Comments pt had 1 fall in the last year, but he is having lightheadedness/dizziness intermittently when he stands up or with activity    Tobacco Cessation No Change    Warm-up and Cool-down Performed as group-led instruction    Resistance Training Performed Yes    VAD Patient? No    PAD/SET Patient? No      Pain Assessment   Currently in Pain? No/denies    Multiple Pain Sites No             Capillary Blood Glucose: No results found for this or any previous visit (from the past 24 hour(s)).    Social History   Tobacco Use  Smoking Status Never  Smokeless Tobacco Never    Goals Met:  Independence with exercise equipment Exercise tolerated well No report of concerns or symptoms today Strength training completed today  Goals Unmet:  Not Applicable  Comments: Check out 915.   Dr. Dina Rich is Medical Director for [facility] Cardiac Rehab

## 2022-05-31 ENCOUNTER — Encounter (HOSPITAL_COMMUNITY)
Admission: RE | Admit: 2022-05-31 | Discharge: 2022-05-31 | Disposition: A | Discharge status: Home / Self Care | Payer: 59 | Source: Ambulatory Visit | Attending: Internal Medicine | Admitting: Internal Medicine

## 2022-05-31 DIAGNOSIS — Z955 Presence of coronary angioplasty implant and graft: Secondary | ICD-10-CM

## 2022-05-31 DIAGNOSIS — I214 Non-ST elevation (NSTEMI) myocardial infarction: Secondary | ICD-10-CM

## 2022-05-31 NOTE — Progress Notes (Signed)
Daily Session Note  Patient Details  Name: Michael Sandoval MRN: [id number] Date of Birth: January 25, 1966 Referring Provider:   Flowsheet Row CARDIAC REHAB PHASE II ORIENTATION from 04/25/2022 in [facility] CARDIAC REHABILITATION  Referring Provider Dr. Lynnette Caffey       Encounter Date: 05/31/2022  Check In:  Session Check In - 05/31/22 0815       Check-In   Supervising physician immediately available to respond to emergencies [facility] MD immediately available    Physician(s) Dr. Diona Browner    Location [facility]    Staff Present Ross Ludwig, BS, Exercise Physiologist;Debra Laural Benes, RN, BSN;Other    Virtual Visit No    Medication changes reported     No    Fall or balance concerns reported    Yes    Comments pt had 1 fall in the last year, but he is having lightheadedness/dizziness intermittently when he stands up or with activity    Warm-up and Cool-down Performed as group-led instruction    Resistance Training Performed Yes    VAD Patient? No    PAD/SET Patient? No      Pain Assessment   Currently in Pain? No/denies    Multiple Pain Sites No             Capillary Blood Glucose: No results found for this or any previous visit (from the past 24 hour(s)).    Social History   Tobacco Use  Smoking Status Never  Smokeless Tobacco Never    Goals Met:  Independence with exercise equipment Exercise tolerated well No report of concerns or symptoms today Strength training completed today  Goals Unmet:  Not Applicable  Comments: check out 0915   Dr. Dina Rich is Medical Director for [facility] Cardiac Rehab

## 2022-06-03 ENCOUNTER — Encounter (HOSPITAL_COMMUNITY)
Admission: RE | Admit: 2022-06-03 | Discharge: 2022-06-03 | Disposition: A | Discharge status: Home / Self Care | Payer: 59 | Source: Ambulatory Visit | Attending: Internal Medicine | Admitting: Internal Medicine

## 2022-06-03 VITALS — Wt 249.3 lb

## 2022-06-03 DIAGNOSIS — Z955 Presence of coronary angioplasty implant and graft: Secondary | ICD-10-CM

## 2022-06-03 DIAGNOSIS — I214 Non-ST elevation (NSTEMI) myocardial infarction: Secondary | ICD-10-CM | POA: Diagnosis not present

## 2022-06-03 NOTE — Progress Notes (Signed)
Daily Session Note  Patient Details  Name: Michael Sandoval MRN: [id number] Date of Birth: 1965/05/25 Referring Provider:   Flowsheet Row CARDIAC REHAB PHASE II ORIENTATION from 04/25/2022 in [facility] CARDIAC REHABILITATION  Referring Provider Dr. Lynnette Caffey       Encounter Date: 06/03/2022  Check In:  Session Check In - 06/03/22 0815       Check-In   Supervising physician immediately available to respond to emergencies [facility] MD immediately available    Physician(s) Dr. Jenene Slicker    Location [facility]    Staff Present Ross Ludwig, BS, Exercise Physiologist;Other    Virtual Visit No    Medication changes reported     No    Fall or balance concerns reported    Yes    Comments pt had 1 fall in the last year, but he is having lightheadedness/dizziness intermittently when he stands up or with activity    Tobacco Cessation No Change    Warm-up and Cool-down Performed as group-led instruction    Resistance Training Performed Yes    VAD Patient? No    PAD/SET Patient? No      Pain Assessment   Currently in Pain? No/denies    Multiple Pain Sites No             Capillary Blood Glucose: No results found for this or any previous visit (from the past 24 hour(s)).    Social History   Tobacco Use  Smoking Status Never  Smokeless Tobacco Never    Goals Met:  Independence with exercise equipment Exercise tolerated well No report of concerns or symptoms today Strength training completed today  Goals Unmet:  Not Applicable  Comments: check out 0915   Dr. Dina Rich is Medical Director for [facility] Cardiac Rehab

## 2022-06-05 ENCOUNTER — Encounter (HOSPITAL_COMMUNITY)
Admission: RE | Admit: 2022-06-05 | Discharge: 2022-06-05 | Disposition: A | Discharge status: Home / Self Care | Payer: 59 | Source: Ambulatory Visit | Attending: Internal Medicine | Admitting: Internal Medicine

## 2022-06-05 DIAGNOSIS — Z955 Presence of coronary angioplasty implant and graft: Secondary | ICD-10-CM

## 2022-06-05 DIAGNOSIS — I214 Non-ST elevation (NSTEMI) myocardial infarction: Secondary | ICD-10-CM

## 2022-06-05 NOTE — Progress Notes (Signed)
Cardiac Individual Treatment Plan  Patient Details  Name: Michael Sandoval MRN: [id number] Date of Birth: 1966/01/29 Referring Provider:   Flowsheet Row CARDIAC REHAB PHASE II ORIENTATION from 04/25/2022 in [facility] CARD[facility]AC [facility]ABIL[facility]  Referring Provider Dr. Lynnette Caffey       Initial Encounter Date:  Flowsheet Row CARDIAC REHAB PHASE II ORIENTATION from 04/25/2022 in [place] CARDIAC REHABILITATION  Date 04/25/22       Visit Diagnosis: NSTEMI (non-ST elevated myocardial infarction)  Status post coronary artery stent placement  Patient's Home Medications on Admission:  Current Outpatient Medications:    aspirin EC 81 MG tablet, Take 81 mg by mouth daily. Swallow whole., Disp: , Rfl:    cetirizine (ZYRTEC) 10 MG tablet, Take 10 mg by mouth as needed for allergies., Disp: , Rfl:    clopidogrel (PLAVIX) 75 MG tablet, Take 1 tablet (75 mg total) by mouth daily., Disp: 90 tablet, Rfl: 3   ezetimibe (ZETIA) 10 MG tablet, Take 1 tablet (10 mg total) by mouth daily., Disp: 90 tablet, Rfl: 3   fluticasone (FLONASE) 50 MCG/ACT nasal spray, Place 1 spray into both nostrils daily as needed for allergies., Disp: , Rfl:    glucose blood test strip, 1 each by Other route as needed for other. Use as instructed One Touch Verio (Blood Glucose Test) One Touch Delica Lancets 33G, Disp: , Rfl:    metoprolol succinate (TOPROL XL) 25 MG 24 hr tablet, Take 0.5 tablets (12.5 mg total) by mouth daily., Disp: 45 tablet, Rfl: 3   MOUNJARO 2.5 MG/0.5ML Pen, Inject 2.5 mg into the skin once a week., Disp: , Rfl:    Multiple Vitamins-Minerals (MULTIVITAMIN WITH MINERALS) tablet, Take 1 tablet by mouth daily., Disp: , Rfl:    nitroGLYCERIN (NITROSTAT) 0.4 MG SL tablet, Place 1 tablet (0.4 mg total) under the tongue every 5 (five) minutes as needed for chest pain., Disp: 25 tablet, Rfl: 3   NOVOLIN 70/30 KWIKPEN (70-30) 100 UNIT/ML KwikPen, Inject 15 Units into the skin 2 (two) times daily., Disp: , Rfl:     omeprazole (PRILOSEC OTC) 20 MG tablet, Take 1 tablet by mouth daily as needed (for acid reflux)., Disp: , Rfl:    rosuvastatin (CRESTOR) 10 MG tablet, Take 1 tablet (10 mg total) by mouth daily., Disp: 90 tablet, Rfl: 3   venlafaxine XR (EFFEXOR-XR) 150 MG 24 hr capsule, Take 150 mg by mouth daily., Disp: , Rfl:    XIGDUO XR 06-998 MG TB24, Take 1 tablet by mouth 2 (two) times daily., Disp: , Rfl:   Past Medical History: Past Medical History:  Diagnosis Date   Anxiety    Arthritis    Cancer (HCC)    melanoma skin cancer    Daytime somnolence    Decreased hearing    Depression    Diabetes mellitus, type II (HCC)        Dyslipidemia    Elevated blood pressure reading    Elevated coronary artery calcium score    Excessive daytime sleepiness    GERD (gastroesophageal reflux disease)    HTN (hypertension)    HTN (hypertension)    Hyperlipidemia    Lightheadedness    Non-restorative sleep    Obesity    Osteoarthritis of hip    Pneumonia    1990    Sinusitis    Unspecified disorder of nose and nasal sinuses     Tobacco Use: Social History   Tobacco Use  Smoking Status Never  Smokeless Tobacco Never  Labs: Review Flowsheet       Latest Ref Rng & Units 06/30/2018 08/20/2021 01/07/2022  Labs for ITP Cardiac and Pulmonary Rehab  Cholestrol 100 - 199 mg/dL - 231  102   LDL (calc) 0 - 99 mg/dL - 141  29   HDL-C >39 mg/dL - 48  51   Trlycerides 0 - 149 mg/dL - 232  128   Hemoglobin A1c 4.8 - 5.6 % 6.4  - -    Capillary Blood Glucose: Lab Results  Component Value Date   GLUCAP 155 (H) 09/12/2021   GLUCAP 163 (H) 09/12/2021   GLUCAP 196 (H) 09/11/2021   GLUCAP 215 (H) 09/11/2021   GLUCAP 275 (H) 09/11/2021     Exercise Target Goals: Exercise Program Goal: Individual exercise prescription set using results from initial 6 min walk test and THRR while considering  patient's activity barriers and safety.   Exercise Prescription Goal: Starting with aerobic activity  30 plus minutes a day, 3 days per week for initial exercise prescription. Provide home exercise prescription and guidelines that participant acknowledges understanding prior to discharge.  Activity Barriers & Risk Stratification:  Activity Barriers & Cardiac Risk Stratification - 04/25/22 1259       Activity Barriers & Cardiac Risk Stratification   Activity Barriers Joint Problems;Chest Pain/Angina;Balance Concerns;Right Hip Replacement;Deconditioning;History of Falls;Muscular Weakness;Shortness of Breath    Cardiac Risk Stratification High             6 Minute Walk:  6 Minute Walk     Row Name 04/25/22 1412         6 Minute Walk   Phase Initial     Distance 1525 feet     Walk Time 6 minutes     # of Rest Breaks 0     MPH 2.89     METS 3.47     RPE 11     VO2 Peak 12.15     Symptoms No     Resting HR 70 bpm     Resting BP 112/64     Resting Oxygen Saturation  97 %     Exercise Oxygen Saturation  during 6 min walk 99 %     Max Ex. HR 104 bpm     Max Ex. BP 142/74     2 Minute Post BP 124/70              Oxygen Initial Assessment:   Oxygen Re-Evaluation:   Oxygen Discharge (Final Oxygen Re-Evaluation):   Initial Exercise Prescription:  Initial Exercise Prescription - 04/25/22 1400       Date of Initial Exercise RX and Referring Provider   Date 04/25/22    Referring Provider Dr. Ali Lowe    Expected Discharge Date 07/12/22      Treadmill   MPH 2.4    Grade 0    Minutes 17      Recumbant Elliptical   Level 1    RPM 60    Minutes 22      Prescription Details   Frequency (times per week) 3    Duration Progress to 30 minutes of continuous aerobic without signs/symptoms of physical distress      Intensity   THRR 40-80% of Max Heartrate 65-130    Ratings of Perceived Exertion 11-13    Perceived Dyspnea 0-4      Resistance Training   Training Prescription Yes    Weight 4    Reps 10-15  Perform Capillary Blood Glucose  checks as needed.  Exercise Prescription Changes:   Exercise Prescription Changes     Row Name 05/20/22 1000 06/03/22 1300           Response to Exercise   Blood Pressure (Admit) 120/62 122/70      Blood Pressure (Exercise) 140/62 130/60      Blood Pressure (Exit) 110/66 118/60      Heart Rate (Admit) 85 bpm 87 bpm      Heart Rate (Exercise) 100 bpm 120 bpm      Heart Rate (Exit) 94 bpm 96 bpm      Rating of Perceived Exertion (Exercise) 12 13      Duration Continue with 30 min of aerobic exercise without signs/symptoms of physical distress. Continue with 30 min of aerobic exercise without signs/symptoms of physical distress.      Intensity THRR unchanged THRR unchanged        Progression   Progression Continue to progress workloads to maintain intensity without signs/symptoms of physical distress. Continue to progress workloads to maintain intensity without signs/symptoms of physical distress.        Resistance Training   Training Prescription Yes Yes      Weight 4 3      Reps 10-15 10-15      Time 10 Minutes 10 Minutes        Treadmill   MPH 2.6 2.8      Grade 0 1      Minutes 17 17      METs 2.99 3.53        Recumbant Elliptical   Level 3 4      RPM 55 70      Minutes 22 22      METs 2.8 3.5               Exercise Comments:   Exercise Goals and Review:   Exercise Goals     Row Name 04/25/22 1414 05/06/22 1112 06/03/22 1315         Exercise Goals   Increase Physical Activity Yes Yes Yes     Intervention Provide advice, education, support and counseling about physical activity/exercise needs.;Develop an individualized exercise prescription for aerobic and resistive training based on initial evaluation findings, risk stratification, comorbidities and participant's personal goals. Provide advice, education, support and counseling about physical activity/exercise needs.;Develop an individualized exercise prescription for aerobic and resistive training based  on initial evaluation findings, risk stratification, comorbidities and participant's personal goals. Provide advice, education, support and counseling about physical activity/exercise needs.;Develop an individualized exercise prescription for aerobic and resistive training based on initial evaluation findings, risk stratification, comorbidities and participant's personal goals.     Expected Outcomes Short Term: Attend rehab on a regular basis to increase amount of physical activity.;Long Term: Add in home exercise to make exercise part of routine and to increase amount of physical activity.;Long Term: Exercising regularly at least 3-5 days a week. Short Term: Attend rehab on a regular basis to increase amount of physical activity.;Long Term: Add in home exercise to make exercise part of routine and to increase amount of physical activity.;Long Term: Exercising regularly at least 3-5 days a week. Short Term: Attend rehab on a regular basis to increase amount of physical activity.;Long Term: Add in home exercise to make exercise part of routine and to increase amount of physical activity.;Long Term: Exercising regularly at least 3-5 days a week.     Increase Strength and Stamina Yes Yes  Yes     Intervention Provide advice, education, support and counseling about physical activity/exercise needs.;Develop an individualized exercise prescription for aerobic and resistive training based on initial evaluation findings, risk stratification, comorbidities and participant's personal goals. Provide advice, education, support and counseling about physical activity/exercise needs.;Develop an individualized exercise prescription for aerobic and resistive training based on initial evaluation findings, risk stratification, comorbidities and participant's personal goals. Provide advice, education, support and counseling about physical activity/exercise needs.;Develop an individualized exercise prescription for aerobic and  resistive training based on initial evaluation findings, risk stratification, comorbidities and participant's personal goals.     Expected Outcomes Short Term: Increase workloads from initial exercise prescription for resistance, speed, and METs.;Short Term: Perform resistance training exercises routinely during rehab and add in resistance training at home;Long Term: Improve cardiorespiratory fitness, muscular endurance and strength as measured by increased METs and functional capacity ( ) Short Term: Increase workloads from initial exercise prescription for resistance, speed, and METs.;Short Term: Perform resistance training exercises routinely during rehab and add in resistance training at home;Long Term: Improve cardiorespiratory fitness, muscular endurance and strength as measured by increased METs and functional capacity ( ) Short Term: Increase workloads from initial exercise prescription for resistance, speed, and METs.;Short Term: Perform resistance training exercises routinely during rehab and add in resistance training at home;Long Term: Improve cardiorespiratory fitness, muscular endurance and strength as measured by increased METs and functional capacity ( )     Able to understand and use rate of perceived exertion (RPE) scale Yes Yes Yes     Intervention Provide education and explanation on how to use RPE scale Provide education and explanation on how to use RPE scale Provide education and explanation on how to use RPE scale     Expected Outcomes Short Term: Able to use RPE daily in rehab to express subjective intensity level;Long Term:  Able to use RPE to guide intensity level when exercising independently Short Term: Able to use RPE daily in rehab to express subjective intensity level;Long Term:  Able to use RPE to guide intensity level when exercising independently Short Term: Able to use RPE daily in rehab to express subjective intensity level;Long Term:  Able to use RPE to guide  intensity level when exercising independently     Knowledge and understanding of Target Heart Rate Range (THRR) Yes Yes Yes     Intervention Provide education and explanation of THRR including how the numbers were predicted and where they are located for reference Provide education and explanation of THRR including how the numbers were predicted and where they are located for reference Provide education and explanation of THRR including how the numbers were predicted and where they are located for reference     Expected Outcomes Short Term: Able to state/look up THRR;Long Term: Able to use THRR to govern intensity when exercising independently;Short Term: Able to use daily as guideline for intensity in rehab Short Term: Able to state/look up THRR;Long Term: Able to use THRR to govern intensity when exercising independently;Short Term: Able to use daily as guideline for intensity in rehab Short Term: Able to state/look up THRR;Long Term: Able to use THRR to govern intensity when exercising independently;Short Term: Able to use daily as guideline for intensity in rehab     Able to check pulse independently Yes Yes Yes     Intervention Provide education and demonstration on how to check pulse in carotid and radial arteries.;Review the importance of being able to check your own pulse for safety during independent exercise Provide  education and demonstration on how to check pulse in carotid and radial arteries.;Review the importance of being able to check your own pulse for safety during independent exercise Provide education and demonstration on how to check pulse in carotid and radial arteries.;Review the importance of being able to check your own pulse for safety during independent exercise     Expected Outcomes Long Term: Able to check pulse independently and accurately;Short Term: Able to explain why pulse checking is important during independent exercise Long Term: Able to check pulse independently and  accurately;Short Term: Able to explain why pulse checking is important during independent exercise Long Term: Able to check pulse independently and accurately;Short Term: Able to explain why pulse checking is important during independent exercise     Understanding of Exercise Prescription Yes Yes Yes     Intervention Provide education, explanation, and written materials on patient's individual exercise prescription Provide education, explanation, and written materials on patient's individual exercise prescription Provide education, explanation, and written materials on patient's individual exercise prescription     Expected Outcomes Short Term: Able to explain program exercise prescription;Long Term: Able to explain home exercise prescription to exercise independently Short Term: Able to explain program exercise prescription;Long Term: Able to explain home exercise prescription to exercise independently Short Term: Able to explain program exercise prescription;Long Term: Able to explain home exercise prescription to exercise independently              Exercise Goals Re-Evaluation :  Exercise Goals Re-Evaluation     Row Name 05/06/22 1113 06/03/22 1315           Exercise Goal Re-Evaluation   Exercise Goals Review Increase Physical Activity;Increase Strength and Stamina;Able to understand and use rate of perceived exertion (RPE) scale;Knowledge and understanding of Target Heart Rate Range (THRR);Able to check pulse independently;Understanding of Exercise Prescription Increase Physical Activity;Increase Strength and Stamina;Able to understand and use rate of perceived exertion (RPE) scale;Knowledge and understanding of Target Heart Rate Range (THRR);Able to check pulse independently;Understanding of Exercise Prescription      Comments Pt completed orientation on 04/25/2022, and he will begin the program on 05/13/2022. Unable to assess progress at this time. Pt has completed 10 sessions pf CR. He is  very motivated and egar to increase his workloads. He is currently exercising at 3.5 METs on the treadmill. Will contiunue to monitor and progress as able.      Expected Outcomes Through exercise at rehab and at home, the patient will meet their stated goals. Through exercise at rehab and at home, the patient will meet their stated goals.                Discharge Exercise Prescription (Final Exercise Prescription Changes):  Exercise Prescription Changes - 06/03/22 1300       Response to Exercise   Blood Pressure (Admit) 122/70    Blood Pressure (Exercise) 130/60    Blood Pressure (Exit) 118/60    Heart Rate (Admit) 87 bpm    Heart Rate (Exercise) 120 bpm    Heart Rate (Exit) 96 bpm    Rating of Perceived Exertion (Exercise) 13    Duration Continue with 30 min of aerobic exercise without signs/symptoms of physical distress.    Intensity THRR unchanged      Progression   Progression Continue to progress workloads to maintain intensity without signs/symptoms of physical distress.      Resistance Training   Training Prescription Yes    Weight 3    Reps 10-15  Time 10 Minutes      Treadmill   MPH 2.8    Grade 1    Minutes 17    METs 3.53      Recumbant Elliptical   Level 4    RPM 70    Minutes 22    METs 3.5             Nutrition:  Target Goals: Understanding of nutrition guidelines, daily intake of sodium 1500mg , cholesterol 200mg , calories 30% from fat and 7% or less from saturated fats, daily to have 5 or more servings of fruits and vegetables.  Biometrics:  Pre Biometrics - 04/25/22 1415       Pre Biometrics   Height  (1.753 m)    Weight 116.2 kg    Waist Circumference 49 inches    Hip Circumference 46 inches    Waist to Hip Ratio 1.07 %    BMI (Calculated) 37.81    Triceps Skinfold 34 mm    % Body Fat 38.6 %    Grip Strength 46.6 kg    Flexibility 0 in    Single Leg Stand 15 seconds              Nutrition Therapy Plan and  Nutrition Goals:  Nutrition Therapy & Goals - 04/25/22 1452       Personal Nutrition Goals   Comments Patient scored 104 on his diet assessment. We have educational sessions on heart heathy nutrition with handouts and offer assistance with RD referral if patient is interested.      Intervention Plan   Intervention Nutrition handout(s) given to patient.    Expected Outcomes Short Term Goal: Understand basic principles of dietary content, such as calories, fat, sodium, cholesterol and nutrients.             Nutrition Assessments:  Nutrition Assessments - 04/25/22 1311       MEDFICTS Scores   Pre Score 104            MEDIFICTS Score Key: ?70 Need to make dietary changes  40-70 Heart Healthy Diet ? 40 Therapeutic Level Cholesterol Diet   Picture Your Plate Scores: <16 Unhealthy dietary pattern with much room for improvement. 41-50 Dietary pattern unlikely to meet recommendations for good health and room for improvement. 51-60 More healthful dietary pattern, with some room for improvement.  >60 Healthy dietary pattern, although there may be some specific behaviors that could be improved.    Nutrition Goals Re-Evaluation:   Nutrition Goals Discharge (Final Nutrition Goals Re-Evaluation):   Psychosocial: Target Goals: Acknowledge presence or absence of significant depression and/or stress, maximize coping skills, provide positive support system. Participant is able to verbalize types and ability to use techniques and skills needed for reducing stress and depression.  Initial Review & Psychosocial Screening:  Initial Psych Review & Screening - 05/29/22 1229       Initial Review   Source of Stress Concerns --    Comments --             Quality of Life Scores:  Quality of Life - 04/25/22 1411       Quality of Life   Select Quality of Life      Quality of Life Scores   Health/Function Pre 15.43 %    Socioeconomic Pre 26.56 %    Psych/Spiritual Pre 18.79  %    Family Pre 22.5 %    GLOBAL Pre 19.52 %  Scores of 19 and below usually indicate a poorer quality of life in these areas.  A difference of  2-3 points is a clinically meaningful difference.  A difference of 2-3 points in the total score of the Quality of Life Index has been associated with significant improvement in overall quality of life, self-image, physical symptoms, and general health in studies assessing change in quality of life.  PHQ-9: Review Flowsheet       04/25/2022  Depression screen PHQ 2/9  Decreased Interest 2  Down, Depressed, Hopeless 0  PHQ - 2 Score 2  Altered sleeping 1  Tired, decreased energy 1  Change in appetite 1  Feeling bad or failure about yourself  1  Trouble concentrating 3  Moving slowly or fidgety/restless 0  Suicidal thoughts 0  PHQ-9 Score 9  Difficult doing work/chores Somewhat difficult   Interpretation of Total Score  Total Score Depression Severity:  1-4 = Minimal depression, 5-9 = Mild depression, 10-14 = Moderate depression, 15-19 = Moderately severe depression, 20-27 = Severe depression   Psychosocial Evaluation and Intervention:  Psychosocial Evaluation - 04/25/22 1421       Psychosocial Evaluation & Interventions   Interventions Relaxation education;Encouraged to exercise with the program and follow exercise prescription;Stress management education    Comments Pt has no barriers to completing the CR program.  He has a history of depression/anxiety, but per the pt this is managed well with Venlafaxine.  His job is stressful due to traveling, but he is eager to start the program and get healthier, which he feels will help with stress management.  He does have a good support system with his friends.  He scored a 9 on his PHQ-9 due to his fatigue, sleep problems, concentration issues, and feeling bad about his weight gain.  He mentioned that he used to be in the [employer] and was in very good shape, so he is discouraged with  his current weight.  He has been taking steps over the past few months to become healthier by exercising at home and eating healthier.  Pt is very eager to start the program to reach his goals of losing more weight and being healthier overall.    Expected Outcomes Depression/anxiety will continue to be managed with Venlafaxine, and the pt will continue to manage his stressors.  Pt will continue to have no identifiable psychosocial barriers.    Continue Psychosocial Services  No Follow up required             Psychosocial Re-Evaluation:  Psychosocial Re-Evaluation     Row Name 04/29/22 1435 05/29/22 1229           Psychosocial Re-Evaluation   Current issues with Current Psychotropic Meds;Current Stress Concerns;Current Sleep Concerns;Current Anxiety/Panic;Current Depression Current Psychotropic Meds;Current Stress Concerns;Current Sleep Concerns;Current Anxiety/Panic;Current Depression      Comments Patient has not started the program. He plans to start in April due to his work schedule. We will continue to montior his progress. Patient has completed 7 sessions. He is doing well. His depression, anxiety and stress contiune to be managed with Venlafazine. He seems to enjoy the sessions and demonstrates an interesst in improving his health. We will continue to monitor his progress.      Expected Outcomes Patient will continue to have no psychosocial barriers identified and his Depression/anxiety/stress will continue to be managed with Venlafazxine. Patient will continue to have no psychosocial barriers identified and his Depression/anxiety/stress will continue to be managed with Venlafazxine.  Interventions Stress management education;Encouraged to attend Cardiac Rehabilitation for the exercise;Relaxation education Stress management education;Encouraged to attend Cardiac Rehabilitation for the exercise;Relaxation education      Continue Psychosocial Services  No Follow up required No Follow  up required        Initial Review   Source of Stress Concerns Occupation;Chronic Illness --      Comments pt is having to travel now for his job --               Psychosocial Discharge (Final Psychosocial Re-Evaluation):  Psychosocial Re-Evaluation - 05/29/22 1229       Psychosocial Re-Evaluation   Current issues with Current Psychotropic Meds;Current Stress Concerns;Current Sleep Concerns;Current Anxiety/Panic;Current Depression    Comments Patient has completed 7 sessions. He is doing well. His depression, anxiety and stress contiune to be managed with Venlafazine. He seems to enjoy the sessions and demonstrates an interesst in improving his health. We will continue to monitor his progress.    Expected Outcomes Patient will continue to have no psychosocial barriers identified and his Depression/anxiety/stress will continue to be managed with Venlafazxine.    Interventions Stress management education;Encouraged to attend Cardiac Rehabilitation for the exercise;Relaxation education    Continue Psychosocial Services  No Follow up required             Vocational Rehabilitation: Provide vocational rehab assistance to qualifying candidates.   Vocational Rehab Evaluation & Intervention:  Vocational Rehab - 04/25/22 1325       Initial Vocational Rehab Evaluation & Intervention   Assessment shows need for Vocational Rehabilitation No      Vocational Rehab Re-Evaulation   Comments pt has already returned to work             Education: Education Goals: Education classes will be provided on a weekly basis, covering required topics. Participant will state understanding/return demonstration of topics presented.  Learning Barriers/Preferences:  Learning Barriers/Preferences - 04/25/22 1314       Learning Barriers/Preferences   Learning Barriers Hearing    Learning Preferences Skilled Demonstration             Education Topics: Hypertension, Hypertension  Reduction -Define heart disease and high blood pressure. Discus how high blood pressure affects the body and ways to reduce high blood pressure. Flowsheet Row CARDIAC REHAB PHASE II EXERCISE from 06/05/2022 in [place] CARDIAC REHABILITATION  Date 05/22/22  Educator HB  Instruction Review Code 1- Verbalizes Understanding       Exercise and Your Heart -Discuss why it is important to exercise, the FITT principles of exercise, normal and abnormal responses to exercise, and how to exercise safely. Flowsheet Row CARDIAC REHAB PHASE II EXERCISE from 06/05/2022 in [place] CARDIAC REHABILITATION  Date 05/29/22  Educator HB  Instruction Review Code 1- Verbalizes Understanding       Angina -Discuss definition of angina, causes of angina, treatment of angina, and how to decrease risk of having angina. Flowsheet Row CARDIAC REHAB PHASE II EXERCISE from 06/05/2022 in [place] CARDIAC REHABILITATION  Date 06/05/22  Educator HB  Instruction Review Code 2- Demonstrated Understanding       Cardiac Medications -Review what the following cardiac medications are used for, how they affect the body, and side effects that may occur when taking the medications.  Medications include Aspirin, Beta blockers, calcium channel blockers, ACE Inhibitors, angiotensin receptor blockers, diuretics, digoxin, and antihyperlipidemics.   Congestive Heart Failure -Discuss the definition of CHF, how to live with CHF, the signs and  symptoms of CHF, and how keep track of weight and sodium intake.   Heart Disease and Intimacy -Discus the effect sexual activity has on the heart, how changes occur during intimacy as we age, and safety during sexual activity.   Smoking Cessation / COPD -Discuss different methods to quit smoking, the health benefits of quitting smoking, and the definition of COPD.   Nutrition I: Fats -Discuss the types of cholesterol, what cholesterol does to the heart, and how cholesterol levels  can be controlled.   Nutrition II: Labels -Discuss the different components of food labels and how to read food label   Heart Parts/Heart Disease and PAD -Discuss the anatomy of the heart, the pathway of blood circulation through the heart, and these are affected by heart disease.   Stress I: Signs and Symptoms -Discuss the causes of stress, how stress may lead to anxiety and depression, and ways to limit stress.   Stress II: Relaxation -Discuss different types of relaxation techniques to limit stress.   Warning Signs of Stroke / TIA -Discuss definition of a stroke, what the signs and symptoms are of a stroke, and how to identify when someone is having stroke. Flowsheet Row CARDIAC REHAB PHASE II EXERCISE from 06/05/2022 in [place] CARDIAC REHABILITATION  Date 05/15/22  Educator DF  Instruction Review Code 2- Demonstrated Understanding       Knowledge Questionnaire Score:  Knowledge Questionnaire Score - 04/25/22 1316       Knowledge Questionnaire Score   Pre Score 21/24             Core Components/Risk Factors/Patient Goals at Admission:  Personal Goals and Risk Factors at Admission - 04/25/22 1326       Core Components/Risk Factors/Patient Goals on Admission    Weight Management Yes;Weight Loss    Intervention Weight Management: Develop a combined nutrition and exercise program designed to reach desired caloric intake, while maintaining appropriate intake of nutrient and fiber, sodium and fats, and appropriate energy expenditure required for the weight goal.;Weight Management: Provide education and appropriate resources to help participant work on and attain dietary goals.;Weight Management/Obesity: Establish reasonable short term and long term weight goals.;Obesity: Provide education and appropriate resources to help participant work on and attain dietary goals.    Improve shortness of breath with ADL's Yes    Intervention Provide education, individualized  exercise plan and daily activity instruction to help decrease symptoms of SOB with activities of daily living.    Expected Outcomes Short Term: Improve cardiorespiratory fitness to achieve a reduction of symptoms when performing ADLs;Long Term: Be able to perform more ADLs without symptoms or delay the onset of symptoms    Diabetes Yes    Intervention Provide education about signs/symptoms and action to take for hypo/hyperglycemia.;Provide education about proper nutrition, including hydration, and aerobic/resistive exercise prescription along with prescribed medications to achieve blood glucose in normal ranges: Fasting glucose 65-99 mg/dL    Expected Outcomes Short Term: Participant verbalizes understanding of the signs/symptoms and immediate care of hyper/hypoglycemia, proper foot care and importance of medication, aerobic/resistive exercise and nutrition plan for blood glucose control.;Long Term: Attainment of HbA1C < 7%.    Lipids Yes    Intervention Provide education and support for participant on nutrition & aerobic/resistive exercise along with prescribed medications to achieve LDL 70mg , HDL >40mg .    Expected Outcomes Short Term: Participant states understanding of desired cholesterol values and is compliant with medications prescribed. Participant is following exercise prescription and nutrition guidelines.;Long Term: Cholesterol controlled  with medications as prescribed, with individualized exercise RX and with personalized nutrition plan. Value goals: LDL < 70mg , HDL > 40 mg.    Stress Yes    Intervention Offer individual and/or small group education and counseling on adjustment to heart disease, stress management and health-related lifestyle change. Teach and support self-help strategies.;Refer participants experiencing significant psychosocial distress to appropriate mental health specialists for further evaluation and treatment. When possible, include family members and significant others in  education/counseling sessions.    Expected Outcomes Short Term: Participant demonstrates changes in health-related behavior, relaxation and other stress management skills, ability to obtain effective social support, and compliance with psychotropic medications if prescribed.;Long Term: Emotional wellbeing is indicated by absence of clinically significant psychosocial distress or social isolation.    Personal Goal Other Yes    Personal Goal improve his overall health    Intervention Pt will attend CR 3 times a week and continue to exercise at home.    Expected Outcomes Pt will attend classes and meet both personal and program goals.             Core Components/Risk Factors/Patient Goals Review:   Goals and Risk Factor Review     Row Name 04/29/22 1437 05/29/22 1235           Core Components/Risk Factors/Patient Goals Review   Personal Goals Review Weight Management/Obesity;Improve shortness of breath with ADL's;Diabetes;Lipids;Other;Stress Weight Management/Obesity;Improve shortness of breath with ADL's;Diabetes;Lipids;Other;Stress      Review Patient was referred to CR with NSTEMI. He has multiple risk factors for CAD and is participating in the program for risk modification. He plans to start the program in April. His DM is managed with Insulin and mounjaro. His last A1C on file was 09/05/21 at 10.8%. His personal goals for the program are to lose weight and get healthier overall. We will continue to monitor his progress as he works towards meeting these goals. Patient has completed 7 sessions. His current weight is 254.0 lbs down 2 lbs from his orientation weight. He is doing well in the program with consistent attendance and progressions. His blood pressure is at goal. His DM continues to be managed with Insulin and mounjaro. His last A1C on file was 09/05/21 at 10.8%. His personal goals for the program continue to be to lose weight and get healthier overall. We will continue to monitor his  progress as he works towards meeting these goals.      Expected Outcomes Patient will complete the program meeting both personal and program goals. Patient will complete the program meeting both personal and program goals.               Core Components/Risk Factors/Patient Goals at Discharge (Final Review):   Goals and Risk Factor Review - 05/29/22 1235       Core Components/Risk Factors/Patient Goals Review   Personal Goals Review Weight Management/Obesity;Improve shortness of breath with ADL's;Diabetes;Lipids;Other;Stress    Review Patient has completed 7 sessions. His current weight is 254.0 lbs down 2 lbs from his orientation weight. He is doing well in the program with consistent attendance and progressions. His blood pressure is at goal. His DM continues to be managed with Insulin and mounjaro. His last A1C on file was 09/05/21 at 10.8%. His personal goals for the program continue to be to lose weight and get healthier overall. We will continue to monitor his progress as he works towards meeting these goals.    Expected Outcomes Patient will complete the program meeting both  personal and program goals.             ITP Comments:   Comments: ITP REVIEW Pt is making expected progress toward Cardiac Rehab goals after completing 11 sessions. Recommend continued exercise, life style modification, education, and increased stamina and strength.

## 2022-06-07 ENCOUNTER — Encounter (HOSPITAL_COMMUNITY)
Admission: RE | Admit: 2022-06-07 | Discharge: 2022-06-07 | Disposition: A | Discharge status: Home / Self Care | Payer: 59 | Source: Ambulatory Visit | Attending: Internal Medicine | Admitting: Internal Medicine

## 2022-06-07 DIAGNOSIS — I214 Non-ST elevation (NSTEMI) myocardial infarction: Secondary | ICD-10-CM | POA: Diagnosis not present

## 2022-06-07 DIAGNOSIS — Z955 Presence of coronary angioplasty implant and graft: Secondary | ICD-10-CM

## 2022-06-07 NOTE — Progress Notes (Signed)
Daily Session Note  Patient Details  Name: Michael Sandoval MRN: [id number] Date of Birth: 03-05-1965 Referring Provider:   Flowsheet Row CARDIAC REHAB PHASE II ORIENTATION from 04/25/2022 in [facility] CARDIAC REHABILITATION  Referring Provider Dr. Lynnette Caffey       Encounter Date: 06/07/2022  Check In:  Session Check In - 06/07/22 0815       Check-In   Supervising physician immediately available to respond to emergencies [facility] MD immediately available    Physician(s) Dr. Jenene Slicker    Location [facility]    Staff Present Rodena Medin, RN, BSN;Phyllis Billingsley, RN;Dorthula Bier Daphine Deutscher, RN, BSN;Hillary Troutman BSN, RN    Virtual Visit No    Medication changes reported     No    Fall or balance concerns reported    Yes    Comments pt had 1 fall in the last year, but he is having lightheadedness/dizziness intermittently when he stands up or with activity    Tobacco Cessation No Change    Warm-up and Cool-down Performed as group-led instruction    Resistance Training Performed Yes      Pain Assessment   Currently in Pain? No/denies    Multiple Pain Sites No             Capillary Blood Glucose: No results found for this or any previous visit (from the past 24 hour(s)).    Social History   Tobacco Use  Smoking Status Never  Smokeless Tobacco Never    Goals Met:  Independence with exercise equipment Exercise tolerated well No report of concerns or symptoms today Strength training completed today  Goals Unmet:  Not Applicable  Comments: Checkout at 0915.   Dr. Dina Rich is Medical Director for [facility] Cardiac Rehab

## 2022-06-10 ENCOUNTER — Encounter (HOSPITAL_COMMUNITY)
Admission: RE | Admit: 2022-06-10 | Discharge: 2022-06-10 | Disposition: A | Discharge status: Home / Self Care | Payer: 59 | Source: Ambulatory Visit | Attending: Internal Medicine | Admitting: Internal Medicine

## 2022-06-10 DIAGNOSIS — I214 Non-ST elevation (NSTEMI) myocardial infarction: Secondary | ICD-10-CM

## 2022-06-10 NOTE — Progress Notes (Signed)
Daily Session Note  Patient Details  Name: Michael Sandoval MRN: [id number] Date of Birth: [birth year] Referring Provider:   Flowsheet Row CARDIAC REHAB PHASE II ORIENTATION from 04/25/2022 in [facility] CARDIAC REHABILITATION  Referring Provider Dr. Lynnette Caffey       Encounter Date: 06/10/2022  Check In:  Session Check In - 06/10/22 0815       Check-In   Supervising physician immediately available to respond to emergencies [facility] MD immediately available    Physician(s) Dr Wyline Mood    Location [facility]    Staff Present Rodena Medin, RN, Pleas Koch, RN, BSN    Virtual Visit No    Medication changes reported     No    Fall or balance concerns reported    Yes    Comments pt had 1 fall in the last year, but he is having lightheadedness/dizziness intermittently when he stands up or with activity    Tobacco Cessation No Change    Warm-up and Cool-down Performed as group-led instruction    Resistance Training Performed Yes      Pain Assessment   Currently in Pain? No/denies    Multiple Pain Sites No             Capillary Blood Glucose: No results found for this or any previous visit (from the past 24 hour(s)).    Social History   Tobacco Use  Smoking Status Never  Smokeless Tobacco Never    Goals Met:  Independence with exercise equipment Exercise tolerated well No report of concerns or symptoms today Strength training completed today  Goals Unmet:  Not Applicable  Comments: Checkout at 0915.   Dr. Dina Rich is Medical Director for [facility] Cardiac Rehab

## 2022-06-12 ENCOUNTER — Encounter (HOSPITAL_BASED_OUTPATIENT_CLINIC_OR_DEPARTMENT_OTHER): Payer: Self-pay | Admitting: Emergency Medicine

## 2022-06-12 ENCOUNTER — Emergency Department (HOSPITAL_BASED_OUTPATIENT_CLINIC_OR_DEPARTMENT_OTHER): Payer: 59 | Admitting: Radiology

## 2022-06-12 ENCOUNTER — Other Ambulatory Visit: Payer: Self-pay

## 2022-06-12 ENCOUNTER — Encounter: Payer: Self-pay | Admitting: Internal Medicine

## 2022-06-12 ENCOUNTER — Encounter (HOSPITAL_COMMUNITY)
Admission: RE | Admit: 2022-06-12 | Discharge: 2022-06-12 | Disposition: A | Discharge status: Home / Self Care | Payer: 59 | Source: Ambulatory Visit | Attending: Internal Medicine | Admitting: Internal Medicine

## 2022-06-12 ENCOUNTER — Emergency Department (HOSPITAL_COMMUNITY): Payer: 59

## 2022-06-12 ENCOUNTER — Emergency Department (HOSPITAL_BASED_OUTPATIENT_CLINIC_OR_DEPARTMENT_OTHER)
Admission: EM | Admit: 2022-06-12 | Discharge: 2022-06-12 | Disposition: A | Discharge status: Home / Self Care | Payer: 59 | Attending: Emergency Medicine | Admitting: Emergency Medicine

## 2022-06-12 ENCOUNTER — Emergency Department (HOSPITAL_BASED_OUTPATIENT_CLINIC_OR_DEPARTMENT_OTHER): Payer: 59

## 2022-06-12 DIAGNOSIS — I214 Non-ST elevation (NSTEMI) myocardial infarction: Secondary | ICD-10-CM | POA: Insufficient documentation

## 2022-06-12 DIAGNOSIS — I2510 Atherosclerotic heart disease of native coronary artery without angina pectoris: Secondary | ICD-10-CM | POA: Diagnosis not present

## 2022-06-12 DIAGNOSIS — R0602 Shortness of breath: Secondary | ICD-10-CM | POA: Insufficient documentation

## 2022-06-12 DIAGNOSIS — E119 Type 2 diabetes mellitus without complications: Secondary | ICD-10-CM | POA: Insufficient documentation

## 2022-06-12 DIAGNOSIS — R42 Dizziness and giddiness: Secondary | ICD-10-CM | POA: Diagnosis present

## 2022-06-12 DIAGNOSIS — Z955 Presence of coronary angioplasty implant and graft: Secondary | ICD-10-CM | POA: Insufficient documentation

## 2022-06-12 DIAGNOSIS — Z7982 Long term (current) use of aspirin: Secondary | ICD-10-CM | POA: Diagnosis not present

## 2022-06-12 DIAGNOSIS — Z7984 Long term (current) use of oral hypoglycemic drugs: Secondary | ICD-10-CM | POA: Insufficient documentation

## 2022-06-12 LAB — COMPREHENSIVE METABOLIC PANEL
ALT: 53 U/L — ABNORMAL HIGH (ref 0–44)
AST: 40 U/L (ref 15–41)
Albumin: 4.5 g/dL (ref 3.5–5.0)
Alkaline Phosphatase: 68 U/L (ref 38–126)
Anion gap: 10 (ref 5–15)
BUN: 16 mg/dL (ref 6–20)
CO2: 23 mmol/L (ref 22–32)
Calcium: 9.6 mg/dL (ref 8.9–10.3)
Chloride: 105 mmol/L (ref 98–111)
Creatinine, Ser: 0.87 mg/dL (ref 0.61–1.24)
GFR, Estimated: >60 mL/min (ref >60)
Glucose, Bld: 162 mg/dL — ABNORMAL HIGH (ref 70–99)
Potassium: 4.6 mmol/L (ref 3.5–5.1)
Sodium: 138 mmol/L (ref 135–145)
Total Bilirubin: 0.4 mg/dL (ref 0.3–1.2)
Total Protein: 6.8 g/dL (ref 6.5–8.1)

## 2022-06-12 LAB — URINALYSIS, ROUTINE W REFLEX MICROSCOPIC
Bacteria, UA: NONE SEEN
Bilirubin Urine: NEGATIVE
Glucose, UA: >1000 mg/dL — AB
Hgb urine dipstick: NEGATIVE
Ketones, ur: NEGATIVE mg/dL
Leukocytes,Ua: NEGATIVE
Nitrite: NEGATIVE
Protein, ur: NEGATIVE mg/dL
Specific Gravity, Urine: 1.018 (ref 1.005–1.030)
pH: 5 (ref 5.0–8.0)

## 2022-06-12 LAB — BASIC METABOLIC PANEL
Anion gap: 9 (ref 5–15)
BUN: 15 mg/dL (ref 6–20)
CO2: 23 mmol/L (ref 22–32)
Calcium: 9.3 mg/dL (ref 8.9–10.3)
Chloride: 105 mmol/L (ref 98–111)
Creatinine, Ser: 0.85 mg/dL (ref 0.61–1.24)
GFR, Estimated: >60 mL/min (ref >60)
Glucose, Bld: 159 mg/dL — ABNORMAL HIGH (ref 70–99)
Potassium: 4.4 mmol/L (ref 3.5–5.1)
Sodium: 137 mmol/L (ref 135–145)

## 2022-06-12 LAB — CBC
HCT: 44.7 % (ref 39.0–52.0)
Hemoglobin: 15 g/dL (ref 13.0–17.0)
MCH: 29.9 pg (ref 26.0–34.0)
MCHC: 33.6 g/dL (ref 30.0–36.0)
MCV: 89.2 fL (ref 80.0–100.0)
Platelets: 295 10*3/uL (ref 150–400)
RBC: 5.01 MIL/uL (ref 4.22–5.81)
RDW: 13.8 % (ref 11.5–15.5)
WBC: 13.9 10*3/uL — ABNORMAL HIGH (ref 4.0–10.5)
nRBC: 0 % (ref 0.0–0.2)

## 2022-06-12 LAB — CBC WITH DIFFERENTIAL/PLATELET
Abs Immature Granulocytes: 0.04 10*3/uL (ref 0.00–0.07)
Basophils Absolute: 0.1 10*3/uL (ref 0.0–0.1)
Basophils Relative: 1 %
Eosinophils Absolute: 0.1 10*3/uL (ref 0.0–0.5)
Eosinophils Relative: 1 %
HCT: 44.9 % (ref 39.0–52.0)
Hemoglobin: 15.2 g/dL (ref 13.0–17.0)
Immature Granulocytes: 0 %
Lymphocytes Relative: 26 %
Lymphs Abs: 3.7 10*3/uL (ref 0.7–4.0)
MCH: 30.3 pg (ref 26.0–34.0)
MCHC: 33.9 g/dL (ref 30.0–36.0)
MCV: 89.4 fL (ref 80.0–100.0)
Monocytes Absolute: 0.7 10*3/uL (ref 0.1–1.0)
Monocytes Relative: 5 %
Neutro Abs: 9.5 10*3/uL — ABNORMAL HIGH (ref 1.7–7.7)
Neutrophils Relative %: 67 %
Platelets: 301 10*3/uL (ref 150–400)
RBC: 5.02 MIL/uL (ref 4.22–5.81)
RDW: 14.1 % (ref 11.5–15.5)
WBC: 14.2 10*3/uL — ABNORMAL HIGH (ref 4.0–10.5)
nRBC: 0 % (ref 0.0–0.2)

## 2022-06-12 LAB — DIFFERENTIAL
Abs Immature Granulocytes: 0.05 10*3/uL (ref 0.00–0.07)
Basophils Absolute: 0.1 10*3/uL (ref 0.0–0.1)
Basophils Relative: 1 %
Eosinophils Absolute: 0.1 10*3/uL (ref 0.0–0.5)
Eosinophils Relative: 1 %
Immature Granulocytes: 0 %
Lymphocytes Relative: 26 %
Lymphs Abs: 3.7 10*3/uL (ref 0.7–4.0)
Monocytes Absolute: 0.7 10*3/uL (ref 0.1–1.0)
Monocytes Relative: 5 %
Neutro Abs: 9.7 10*3/uL — ABNORMAL HIGH (ref 1.7–7.7)
Neutrophils Relative %: 67 %

## 2022-06-12 LAB — RAPID URINE DRUG SCREEN, HOSP PERFORMED
Amphetamines: NOT DETECTED
Barbiturates: NOT DETECTED
Benzodiazepines: NOT DETECTED
Cocaine: NOT DETECTED
Opiates: NOT DETECTED
Tetrahydrocannabinol: NOT DETECTED

## 2022-06-12 LAB — TROPONIN I (HIGH SENSITIVITY)
Troponin I (High Sensitivity): 3 ng/L (ref <18)
Troponin I (High Sensitivity): 3 ng/L (ref <18)

## 2022-06-12 LAB — ETHANOL: Alcohol, Ethyl (B): <10 mg/dL (ref <10)

## 2022-06-12 MED ORDER — MECLIZINE HCL 25 MG PO TABS
25.0000 mg | ORAL_TABLET | Freq: Once | ORAL | Status: AC
  Administered 2022-06-12: 25 mg via ORAL
  Filled 2022-06-12: qty 1

## 2022-06-12 MED ORDER — IOHEXOL 350 MG/ML SOLN
100.0000 mL | Freq: Once | INTRAVENOUS | Status: AC | PRN
  Administered 2022-06-12: 75 mL via INTRAVENOUS

## 2022-06-12 NOTE — ED Triage Notes (Signed)
Pt arrives pov, steady gait, c/o increased shob "for awhile", worsening today, dizziness with nausea, diaphoresis and fatigue that started today. Pt also endorses cough congestion x 3 weeks. Pt speaking in complete sentences in triage

## 2022-06-12 NOTE — ED Notes (Signed)
PIV secured for transport.  Patient left with friend via POV.  Informed to go to [facility] ER.

## 2022-06-12 NOTE — ED Provider Notes (Signed)
  Physical Exam  BP 126/76   Pulse 75   Temp 98.1 F (36.7 C) (Oral)   Resp 20   Ht 5\' 9"  (1.753 m)   Wt 109.3 kg   SpO2 100%   BMI 35.59 kg/m   Physical Exam  Procedures  Procedures  ED Course / MDM   Clinical Course as of 06/12/22 1951  Wed Jun 12, 2022  1750 Troponin I (High Sensitivity) [AH]    Clinical Course User Index [AH] Arthor Captain, PA-C   Medical Decision Making Amount and/or Complexity of Data Reviewed Labs: ordered. Decision-making details documented in ED Course. Radiology: ordered.  Risk Prescription drug management.  Assumed care after transfer from [facility] from Arthor Captain, PA-C and Dr. Jacqulyn Bath.  Please see her note for full HPI.  In short, 57 year old male presenting for nausea and diaphoresis prior to cardiac rehab.  He was short of breath during that time as well.  Spoke with his cardiologist who recommends coming to the ED for further evaluation.  States his dizziness was worse this morning than it typically is.  He has been dealing with dizziness for multiple years.  Does not know the cause for this.  Plan at the time of transfer is to obtain MRI due to atypical/worsening vertigo symptoms.  If MRI negative, would likely be stable for discharge home with appropriate follow-up.  2100-MRI without acute abnormalities or evidence of infarct.  Patient reports improvement in his symptoms with meclizine.  States he will follow-up closely with his primary care provider.  His symptoms are likely positional.  He denies other complaints at this time.  He was given strict return precautions.  Stable at discharge.  At this time there does not appear to be any evidence of an acute emergency medical condition and the patient appears stable for discharge with appropriate outpatient follow up. Diagnosis was discussed with patient who verbalizes understanding of care plan and is agreeable to discharge. I have discussed return precautions with patient who verbalizes  understanding. Patient encouraged to follow-up with their PCP within 3 days. All questions answered.  Note: Portions of this report may have been transcribed using voice recognition software. Every effort was made to ensure accuracy; however, inadvertent computerized transcription errors may still be present.    Mora Bellman 06/12/22 2112    Wynetta Fines, MD 06/13/22 [phone]

## 2022-06-12 NOTE — ED Notes (Signed)
ED Provider at bedside. 

## 2022-06-12 NOTE — Progress Notes (Signed)
  He Incomplete Session Note  Patient Details  Name: Michael Sandoval MRN: [id number] Date of Birth: 09/19/1965 Referring Provider:   Flowsheet Row CARDIAC REHAB PHASE II ORIENTATION from 04/25/2022 in [facility] CARDIAC REHABILITATION  Referring Provider Dr. Elon Spanner did not complete his rehab session.  Patient came in complaining of dizziness which is chronic for him he says and nausea. His CBG was 110 and blood pressure 120/70. We advised him not to exercise and to follow up with his PCP or urgent care. He verbalized understanding and said he was going home. He stated he has already complained about these symptoms to his PCP.

## 2022-06-12 NOTE — ED Notes (Signed)
Patient transported to MRI 

## 2022-06-12 NOTE — ED Notes (Signed)
Patient transported to X-ray 

## 2022-06-12 NOTE — ED Provider Notes (Signed)
[place] EMERGENCY DEPARTMENT AT [facility] Provider Note   CSN: [id number] Arrival date & time: 06/12/22  1049     History  Chief Complaint  Patient presents with   Shortness of Breath    Michael Sandoval is a 57 y.o. male who presents here emergency department chief complaint of dizziness, shortness of breath.  He has a past medical history of diabetes, coronary artery disease.  He reports a longstanding history of feeling like he is off balance especially with movement.  This has been going on for about 2 years.  It seems to be progressively more frequent.  He has seen a neurologist and had an MRI/MRA that were negative for acute findings.  He reports that it is worse when he looks upward and he could induce severe dizziness and feeling of off balance if he leaves his head laid back.  He reports that this morning he was getting up to go to cardiac rehab.  He woke up and did his normal routine which is to weigh himself, take his blood pressure and blood sugar.  He states that he felt a little bit more dizzy than normal.  He sat down to take his blood sugar and broke out into a cold sweat and became extremely nauseous.  He states that this stayed present for several minutes and then resolved.  He has had persistent feelings of imbalance since that time.  He is able to walk without holding onto anything but states that he has to concentrate a lot to do so.  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 He denies any chest pain.  He does feel short of breath when this occurs.  He states that he has had no exertional chest pain or dyspnea and has had no vomiting.  He has no active nausea at this time.   Shortness of Breath      Home Medications Prior to Admission medications   Medication Sig Start Date End Date Taking? Authorizing Provider  aspirin EC 81 MG tablet Take 81 mg by mouth daily. Swallow whole.    [provider]   cetirizine (ZYRTEC) 10 MG tablet Take 10 mg by mouth as needed for allergies.    [provider]  clopidogrel (PLAVIX) 75 MG tablet Take 1 tablet (75 mg total) by mouth daily. 03/14/22   Orbie Pyo, MD  ezetimibe (ZETIA) 10 MG tablet Take 1 tablet (10 mg total) by mouth daily. 04/09/22   Orbie Pyo, MD  fluticasone (FLONASE) 50 MCG/ACT nasal spray Place 1 spray into both nostrils daily as needed for allergies.    [provider]  glucose blood test strip 1 each by Other route as needed for other. Use as instructed One Touch Verio (Blood Glucose Test) One Touch Delica Lancets 33G    [provider]  metoprolol succinate (TOPROL XL) 25 MG 24 hr tablet Take 0.5 tablets (12.5 mg total) by mouth daily. 03/14/22   Orbie Pyo, MD  MOUNJARO 2.5 MG/0.5ML Pen Inject 2.5 mg into the skin once a week. 02/12/22   [provider]  Multiple Vitamins-Minerals (MULTIVITAMIN WITH MINERALS) tablet Take 1 tablet by mouth daily.    [provider]  nitroGLYCERIN (NITROSTAT) 0.4 MG SL tablet Place 1 tablet (0.4 mg total) under the tongue every 5 (five) minutes as needed for chest pain. 04/24/22 07/23/22  Ronney Asters, NP  NOVOLIN 70/30 Stephanie Coup (  70-30) 100 UNIT/ML KwikPen Inject 15 Units into the skin 2 (two) times daily. 05/15/21   [provider]  omeprazole (PRILOSEC OTC) 20 MG tablet Take 1 tablet by mouth daily as needed (for acid reflux).    [provider]  rosuvastatin (CRESTOR) 10 MG tablet Take 1 tablet (10 mg total) by mouth daily. 03/14/22   Orbie Pyo, MD  venlafaxine XR (EFFEXOR-XR) 150 MG 24 hr capsule Take 150 mg by mouth daily. 12/24/21   [provider]  XIGDUO XR 06-998 MG TB24 Take 1 tablet by mouth 2 (two) times daily. 07/22/21   [provider]      Allergies    Onion, Atorvastatin, Capsicum, and Lactose intolerance (gi)    Review of Systems   Review of Systems  Respiratory:  Positive for shortness  of breath.     Physical Exam Updated Vital Signs BP (!) 145/88   Pulse 72   Temp 97.7 F (36.5 C) (Oral)   Resp 16   Ht 5\' 9"  (1.753 m)   Wt 109.3 kg   SpO2 98%   BMI 35.59 kg/m  Physical Exam Vitals and nursing note reviewed.  Constitutional:      General: He is not in acute distress.    Appearance: He is well-developed. He is not diaphoretic.  HENT:     Head: Normocephalic and atraumatic.  Eyes:     General: No scleral icterus.    Conjunctiva/sclera: Conjunctivae normal.  Cardiovascular:     Rate and Rhythm: Normal rate and regular rhythm.     Heart sounds: Normal heart sounds.  Pulmonary:     Effort: Pulmonary effort is normal. No respiratory distress.     Breath sounds: Normal breath sounds.  Abdominal:     Palpations: Abdomen is soft.     Tenderness: There is no abdominal tenderness.  Musculoskeletal:     Cervical back: Normal range of motion and neck supple.  Skin:    General: Skin is warm and dry.  Neurological:     Mental Status: He is alert.     Comments: Speech is clear and goal oriented, follows commands Major Cranial nerves without deficit, no facial droop Normal strength in upper and lower extremities bilaterally including dorsiflexion and plantar flexion, strong and equal grip strength Sensation normal to light and sharp touch Moves extremities without ataxia, coordination intact Normal finger to nose and rapid alternating movements Neg romberg, no pronator drift Normal gait Normal heel-shin and balance  Patient gets extremely dizzy when he extends his neck  Psychiatric:        Behavior: Behavior normal.     ED Results / Procedures / Treatments   Labs (all labs ordered are listed, but only abnormal results are displayed) Labs Reviewed  BASIC METABOLIC PANEL - Abnormal; Notable for the following components:      Result Value   Glucose, Bld 159 (*)    All other components within normal limits  CBC - Abnormal; Notable for the following  components:   WBC 13.9 (*)    All other components within normal limits  ETHANOL  DIFFERENTIAL  COMPREHENSIVE METABOLIC PANEL  RAPID URINE DRUG SCREEN, HOSP PERFORMED  URINALYSIS, ROUTINE W REFLEX MICROSCOPIC  HEPATIC FUNCTION PANEL  TROPONIN I (HIGH SENSITIVITY)    EKG EKG Interpretation  Date/Time:  Wednesday Jun 12 2022 10:58:17 EDT Ventricular Rate:  75 PR Interval:  176 QRS Duration: 76 QT Interval:  384 QTC Calculation: 428 R Axis:   12 Text  Interpretation: Normal sinus rhythm with sinus arrhythmia Low voltage QRS no acute ischemic appearance. When compared with ECG of 12-Sep-2021 10:15, Nonspecific T wave abnormality, improved in Anterior leads Confirmed by Arby Barrette [phone]) on 06/12/2022 11:39:31 AM  Radiology DG Chest 2 View  Result Date: 06/12/2022 CLINICAL DATA:  Shortness of breath. EXAM: CHEST - 2 VIEW COMPARISON:  Chest radiograph 09/10/2021 FINDINGS: The cardiomediastinal silhouette is normal There is no focal consolidation or pulmonary edema. There is no pleural effusion or pneumothorax There is no acute osseous abnormality. IMPRESSION: No radiographic evidence of acute cardiopulmonary process. Electronically Signed   By: Lesia Hausen M.D.   On: 06/12/2022 11:43    Procedures Procedures    Medications Ordered in ED Medications - No data to display  ED Course/ Medical Decision Making/ A&P Clinical Course as of 06/13/22 1826  Wed Jun 12, 2022  1750 Troponin I (High Sensitivity) [AH]    Clinical Course User Index [AH] Arthor Captain, PA-C                             Medical Decision Making Amount and/or Complexity of Data Reviewed Labs: ordered. Decision-making details documented in ED Course. Radiology: ordered.  Risk Prescription drug management.   This patient presents to the ED for concern of vertigo and sob, this involves an extensive number of treatment options, and is a complaint that carries with it a high risk of complications and  morbidity.  The emergent differential diagnosis for acute vertigo low includes peripheral causes such as BPPV, barotrauma, ear foreign body, Mnire's disease, infectious causes such as lip bronchitis, vestibular neuritis or Ramsay Hunt syndrome.  Other emergent causes are central such as cerebellar stroke, vertebrobasilar insufficiency, neoplastic causes, vertebral artery dissection, MS, neurosyphilis or tuberculosis, epilepsy or migraine.  Other causes include anemia, hyperviscosity syndrome, alcohol or aminoglycoside use, renal failure, hypoglycemia and thyroid disease.\ The emergent differential diagnosis for shortness of breath includes, but is not limited to, Pulmonary edema, bronchoconstriction, Pneumonia, Pulmonary embolism, Pneumotherax/ Hemothorax, Dysrythmia, ACS.    Co morbidities: CAD, DM, chronic vertigo   Social Determinants of Health:  insured  Additional history:  {Additional history obtained from [employer]   Lab Tests:  I Ordered, and personally interpreted labs.  The pertinent results include:   Wbc 14.2 UDS negative Troponin negative x2 Glu 162  Imaging Studies:  I ordered imaging studies including cta head and neck,ct head, cxr I independently visualized and interpreted imaging which showed no acute findings I agree with the radiologist interpretation  Cardiac Monitoring/ECG:  The patient was maintained on a cardiac monitor.  I personally viewed and interpreted the cardiac monitored which showed an underlying rhythm of: nsr at rate of 75  Medicines ordered and prescription drug management:  I ordered medication including  Medications  iohexol (OMNIPAQUE) 350 MG/ML injection 100 mL (75 mLs Intravenous Contrast Given 06/12/22 1538)  meclizine (ANTIVERT) tablet 25 mg (25 mg Oral Given 06/12/22 1657)   for vertigo Reevaluation of the patient after these medicines showed that the patient improved I have reviewed the patients home medicines and have made adjustments as  needed  Test Considered:    MRI- in shared decision making patient wishes to r/o stroke  Consultations Obtained:  Dr. Adela Lank- er for transfer  Problem List / ED Course:     ICD-10-CM   1. Vertigo  R42       MDM:  patient with vertigo- no signs of acs.  Transfer for MRI   Dispostion:  After consideration of the diagnostic results and the patients response to treatment, I feel that the patent would benefit from transfer..          Final Clinical Impression(s) / ED Diagnoses Final diagnoses:  None    Rx / DC Orders ED Discharge Orders     None         Arthor Captain, PA-C 06/13/22 1853    Maia Plan, MD 06/18/22 1342

## 2022-06-12 NOTE — ED Notes (Addendum)
DB ED to [facility] ED transfer Dr Adela Lank is accecpting at [facility]. Patient going by POV.-ABB(NS)

## 2022-06-12 NOTE — ED Notes (Signed)
Handoff report given to Scientist, product/process development at [facility].  Patient to transfer by POV with friend.

## 2022-06-12 NOTE — Discharge Instructions (Addendum)
You have been seen today for your complaint of dizziness. Your lab work was overall reassuring. Your imaging was reassuring and showed no new abnormalities. Your discharge medications include your home medications. Follow up with: Your primary care provider soon as possible for reevaluation Please seek immediate medical care if you develop any of the following symptoms: You vomit or have diarrhea and are unable to eat or drink anything. You have problems talking, walking, swallowing, or using your arms, hands, or legs. You feel generally weak. You have any bleeding. You are not thinking clearly or you have trouble forming sentences. It may take a friend or family member to notice this. You have chest pain, abdominal pain, shortness of breath, or sweating. Your vision changes or you develop a severe headache. At this time there does not appear to be the presence of an emergent medical condition, however there is always the potential for conditions to change. Please read and follow the below instructions.  Do not take your medicine if  develop an itchy rash, swelling in your mouth or lips, or difficulty breathing; call 911 and seek immediate emergency medical attention if this occurs.  You may review your lab tests and imaging results in their entirety on your MyChart account.  Please discuss all results of fully with your primary care provider and other specialist at your follow-up visit.  Note: Portions of this text may have been transcribed using voice recognition software. Every effort was made to ensure accuracy; however, inadvertent computerized transcription errors may still be present.

## 2022-06-12 NOTE — Telephone Encounter (Signed)
Called pt in regards to my chart message.  Pt reports was sent home from Cardiac Rehab d/t symptoms.  Pt had nausea, sweating, and increased fatigue this morning when woke up.  Took a shower nausea and sweating subsided.  Reports continues to feel more tired than normal.  "Feels off".   Advised pt based on symptoms of nausea, sweating, SOB, increased fatigue and hx of prior NSTEMI it's best to have ED evaluation. Pt reports doesn't want to waste ED time.  Advised pt it's not a waste of time.  Pt reports will go to the ED for evaluation.

## 2022-06-14 ENCOUNTER — Encounter (HOSPITAL_COMMUNITY)
Admission: RE | Admit: 2022-06-14 | Discharge: 2022-06-14 | Disposition: A | Discharge status: Home / Self Care | Payer: 59 | Source: Ambulatory Visit | Attending: Internal Medicine | Admitting: Internal Medicine

## 2022-06-14 DIAGNOSIS — I214 Non-ST elevation (NSTEMI) myocardial infarction: Secondary | ICD-10-CM

## 2022-06-14 DIAGNOSIS — Z955 Presence of coronary angioplasty implant and graft: Secondary | ICD-10-CM | POA: Diagnosis present

## 2022-06-14 NOTE — Progress Notes (Signed)
Daily Session Note  Patient Details  Name: Michael Sandoval MRN: [id number] Date of Birth: [birth year] Referring Provider:   Flowsheet Row CARDIAC REHAB PHASE II ORIENTATION from 04/25/2022 in [facility] CARDIAC REHABILITATION  Referring Provider Dr. Lynnette Caffey       Encounter Date: 06/14/2022  Check In:  Session Check In - 06/14/22 0815       Check-In   Supervising physician immediately available to respond to emergencies [facility] MD immediately available    Physician(s) Dr. Dina Rich    Location [facility]    Staff Present Ross Ludwig, BS, Exercise Physiologist;Manning Luna Laural Benes, RN, Pleas Koch, RN, BSN    Virtual Visit No    Medication changes reported     No    Fall or balance concerns reported    Yes    Comments pt had 1 fall in the last year, but he is having lightheadedness/dizziness intermittently when he stands up or with activity    Tobacco Cessation No Change    Warm-up and Cool-down Performed as group-led instruction    Resistance Training Performed Yes    VAD Patient? No    PAD/SET Patient? No      Pain Assessment   Currently in Pain? No/denies    Pain Score 0-No pain    Multiple Pain Sites No             Capillary Blood Glucose: No results found for this or any previous visit (from the past 24 hour(s)).    Social History   Tobacco Use  Smoking Status Never  Smokeless Tobacco Never    Goals Met:  Independence with exercise equipment Exercise tolerated well No report of concerns or symptoms today Strength training completed today  Goals Unmet:  Not Applicable  Comments: Check 915   Dr. Dina Rich is Medical Director for [facility] Cardiac Rehab

## 2022-06-17 ENCOUNTER — Encounter (HOSPITAL_COMMUNITY)
Admission: RE | Admit: 2022-06-17 | Discharge: 2022-06-17 | Disposition: A | Discharge status: Home / Self Care | Payer: 59 | Source: Ambulatory Visit | Attending: Internal Medicine | Admitting: Internal Medicine

## 2022-06-17 VITALS — Wt 244.7 lb

## 2022-06-17 DIAGNOSIS — I214 Non-ST elevation (NSTEMI) myocardial infarction: Secondary | ICD-10-CM | POA: Diagnosis not present

## 2022-06-17 DIAGNOSIS — Z955 Presence of coronary angioplasty implant and graft: Secondary | ICD-10-CM

## 2022-06-17 NOTE — Progress Notes (Signed)
Daily Session Note  Patient Details  Name: Michael Sandoval MRN: [id number] Date of Birth: 10/06/1965 Referring Provider:   Flowsheet Row CARDIAC REHAB PHASE II ORIENTATION from 04/25/2022 in [facility] CARDIAC REHABILITATION  Referring Provider Dr. Lynnette Caffey       Encounter Date: 06/17/2022  Check In:  Session Check In - 06/17/22 0815       Check-In   Supervising physician immediately available to respond to emergencies [facility] MD immediately available    Physician(s) Dr. Dietrich Pates    Location [facility]    Staff Present Ross Ludwig, BS, Exercise Physiologist;Shereda Graw Laural Benes, RN, BSN;Hillary Troutman BSN, RN;Phyllis Billingsley, RN    Virtual Visit No    Medication changes reported     No    Fall or balance concerns reported    Yes    Comments pt had 1 fall in the last year, but he is having lightheadedness/dizziness intermittently when he stands up or with activity    Tobacco Cessation No Change    Warm-up and Cool-down Performed as group-led instruction    Resistance Training Performed Yes    PAD/SET Patient? No      Pain Assessment   Currently in Pain? No/denies    Pain Score 0-No pain    Multiple Pain Sites No             Capillary Blood Glucose: No results found for this or any previous visit (from the past 24 hour(s)).    Social History   Tobacco Use  Smoking Status Never  Smokeless Tobacco Never    Goals Met:  Independence with exercise equipment Exercise tolerated well No report of concerns or symptoms today Strength training completed today  Goals Unmet:  Not Applicable  Comments: Check out 915.   Dr. Dina Rich is Medical Director for [facility] Cardiac Rehab

## 2022-06-19 ENCOUNTER — Encounter (HOSPITAL_COMMUNITY)
Admission: RE | Admit: 2022-06-19 | Discharge: 2022-06-19 | Disposition: A | Discharge status: Home / Self Care | Payer: 59 | Source: Ambulatory Visit | Attending: Internal Medicine | Admitting: Internal Medicine

## 2022-06-19 DIAGNOSIS — Z955 Presence of coronary angioplasty implant and graft: Secondary | ICD-10-CM

## 2022-06-19 DIAGNOSIS — I214 Non-ST elevation (NSTEMI) myocardial infarction: Secondary | ICD-10-CM

## 2022-06-19 NOTE — Progress Notes (Signed)
I have reviewed a Home Exercise Prescription with [name] . Michael Sandoval is  currently exercising at home.  The patient was advised to walk 3 days a week for 30-45 minutes.  Earl Lites and I discussed how to progress their exercise prescription.  The patient stated that their goals were build his strength and endurance .  The patient stated that they understand the exercise prescription.  We reviewed exercise guidelines, target heart rate during exercise, RPE Scale, weather conditions, NTG use, endpoints for exercise, warmup and cool down.  Patient is encouraged to come to me with any questions. I will continue to follow up with the patient to assist them with progression and safety.

## 2022-06-19 NOTE — Progress Notes (Signed)
Daily Session Note  Patient Details  Name: Michael Sandoval MRN: [id number] Date of Birth: 02-05-1966 Referring Provider:   Flowsheet Row CARDIAC REHAB PHASE II ORIENTATION from 04/25/2022 in [facility] CARDIAC REHABILITATION  Referring Provider Dr. Lynnette Caffey       Encounter Date: 06/19/2022  Check In:  Session Check In - 06/19/22 0811       Check-In   Supervising physician immediately available to respond to emergencies [facility] MD immediately available    Physician(s) Dr. Rennis Golden    Location [facility]    Staff Present Ross Ludwig, BS, Exercise Physiologist;Hani Patnode Laural Benes, RN, BSN;Hillary Troutman BSN, RN    Virtual Visit No    Medication changes reported     No    Fall or balance concerns reported    Yes    Comments pt had 1 fall in the last year, but he is having lightheadedness/dizziness intermittently when he stands up or with activity    Tobacco Cessation No Change    Warm-up and Cool-down Performed as group-led instruction    Resistance Training Performed Yes    VAD Patient? No    PAD/SET Patient? No      Pain Assessment   Pain Score 0-No pain    Multiple Pain Sites No             Capillary Blood Glucose: No results found for this or any previous visit (from the past 24 hour(s)).    Social History   Tobacco Use  Smoking Status Never  Smokeless Tobacco Never    Goals Met:  Independence with exercise equipment Exercise tolerated well No report of concerns or symptoms today Strength training completed today  Goals Unmet:  Not Applicable  Comments: Check out 915.   Dr. Dina Rich is Medical Director for [facility] Cardiac Rehab

## 2022-06-21 ENCOUNTER — Encounter (HOSPITAL_COMMUNITY)
Admission: RE | Admit: 2022-06-21 | Discharge: 2022-06-21 | Disposition: A | Discharge status: Home / Self Care | Payer: 59 | Source: Ambulatory Visit | Attending: Internal Medicine | Admitting: Internal Medicine

## 2022-06-21 DIAGNOSIS — Z955 Presence of coronary angioplasty implant and graft: Secondary | ICD-10-CM

## 2022-06-21 DIAGNOSIS — I214 Non-ST elevation (NSTEMI) myocardial infarction: Secondary | ICD-10-CM | POA: Diagnosis not present

## 2022-06-21 NOTE — Progress Notes (Signed)
Daily Session Note  Patient Details  Name: Michael Sandoval MRN: [id number] Date of Birth: 1965/10/07 Referring Provider:   Flowsheet Row CARDIAC REHAB PHASE II ORIENTATION from 04/25/2022 in [facility] CARDIAC REHABILITATION  Referring Provider Dr. Lynnette Caffey       Encounter Date: 06/21/2022  Check In:  Session Check In - 06/21/22 0815       Check-In   Supervising physician immediately available to respond to emergencies [facility] MD immediately available    Physician(s) Dr. Diona Browner    Location [facility]    Staff Present Ross Ludwig, BS, Exercise Physiologist;Phyllis Billingsley, Margarite Gouge, RN, Pleas Koch, RN, BSN    Virtual Visit No    Medication changes reported     Yes    Comments MD added Fluovouracil 5% cream BID    Fall or balance concerns reported    Yes    Comments pt had 1 fall in the last year, but he is having lightheadedness/dizziness intermittently when he stands up or with activity    Tobacco Cessation No Change    Warm-up and Cool-down Performed as group-led instruction    Resistance Training Performed Yes    VAD Patient? No    PAD/SET Patient? No      Pain Assessment   Currently in Pain? No/denies    Pain Score 0-No pain    Multiple Pain Sites No             Capillary Blood Glucose: No results found for this or any previous visit (from the past 24 hour(s)).    Social History   Tobacco Use  Smoking Status Never  Smokeless Tobacco Never    Goals Met:  Independence with exercise equipment Exercise tolerated well No report of concerns or symptoms today Strength training completed today  Goals Unmet:  Not Applicable  Comments: Check out 915.   Dr. Dina Rich is Medical Director for [facility] Cardiac Rehab

## 2022-06-24 ENCOUNTER — Encounter (HOSPITAL_COMMUNITY)
Admission: RE | Admit: 2022-06-24 | Discharge: 2022-06-24 | Disposition: A | Discharge status: Home / Self Care | Payer: 59 | Source: Ambulatory Visit | Attending: Internal Medicine | Admitting: Internal Medicine

## 2022-06-24 DIAGNOSIS — I214 Non-ST elevation (NSTEMI) myocardial infarction: Secondary | ICD-10-CM | POA: Diagnosis not present

## 2022-06-24 DIAGNOSIS — Z955 Presence of coronary angioplasty implant and graft: Secondary | ICD-10-CM

## 2022-06-24 NOTE — Progress Notes (Signed)
Daily Session Note  Patient Details  Name: Michael Sandoval MRN: [id number] Date of Birth: 05/31/1965 Referring Provider:   Flowsheet Row CARDIAC REHAB PHASE II ORIENTATION from 04/25/2022 in [facility] CARDIAC REHABILITATION  Referring Provider Dr. Lynnette Caffey       Encounter Date: 06/24/2022  Check In:  Session Check In - 06/24/22 0815       Check-In   Supervising physician immediately available to respond to emergencies [facility] MD immediately available    Physician(s) Dr. Jenene Slicker    Location [facility]    Staff Present Ross Ludwig, BS, Exercise Physiologist;Dalton Debbe Mounts, MS, ACSM-CEP;Vernal Hritz Laural Benes, RN, BSN    Virtual Visit No    Medication changes reported     No    Fall or balance concerns reported    Yes    Comments pt had 1 fall in the last year, but he is having lightheadedness/dizziness intermittently when he stands up or with activity    Tobacco Cessation No Change    Warm-up and Cool-down Performed as group-led instruction    Resistance Training Performed Yes    VAD Patient? No    PAD/SET Patient? No      Pain Assessment   Currently in Pain? No/denies    Pain Score 0-No pain    Multiple Pain Sites No             Capillary Blood Glucose: No results found for this or any previous visit (from the past 24 hour(s)).    Social History   Tobacco Use  Smoking Status Never  Smokeless Tobacco Never    Goals Met:  Independence with exercise equipment Exercise tolerated well No report of concerns or symptoms today Strength training completed today  Goals Unmet:  Not Applicable  Comments: Check out 915.   Dr. Dina Rich is Medical Director for [facility] Cardiac Rehab

## 2022-06-26 ENCOUNTER — Encounter (HOSPITAL_COMMUNITY)
Admission: RE | Admit: 2022-06-26 | Discharge: 2022-06-26 | Disposition: A | Discharge status: Home / Self Care | Payer: 59 | Source: Ambulatory Visit | Attending: Internal Medicine | Admitting: Internal Medicine

## 2022-06-26 DIAGNOSIS — I214 Non-ST elevation (NSTEMI) myocardial infarction: Secondary | ICD-10-CM | POA: Diagnosis not present

## 2022-06-26 DIAGNOSIS — Z955 Presence of coronary angioplasty implant and graft: Secondary | ICD-10-CM

## 2022-06-26 NOTE — Progress Notes (Signed)
Daily Session Note  Patient Details  Name: Michael Sandoval MRN: [id number] Date of Birth: Jun 20, 1965 Referring Provider:   Flowsheet Row CARDIAC REHAB PHASE II ORIENTATION from 04/25/2022 in [facility] CARDIAC REHABILITATION  Referring Provider Dr. Lynnette Caffey       Encounter Date: 06/26/2022  Check In:  Session Check In - 06/26/22 0815       Check-In   Supervising physician immediately available to respond to emergencies [facility] MD immediately available    Physician(s) Dr. Jenene Slicker    Location [facility]    Staff Present Ross Ludwig, BS, Exercise Physiologist;Dalton Debbe Mounts, MS, ACSM-CEP    Virtual Visit No    Medication changes reported     No    Fall or balance concerns reported    Yes    Comments pt had 1 fall in the last year, but he is having lightheadedness/dizziness intermittently when he stands up or with activity    Tobacco Cessation No Change    Warm-up and Cool-down Performed as group-led instruction    Resistance Training Performed Yes    VAD Patient? No      Pain Assessment   Currently in Pain? No/denies    Pain Score 0-No pain    Multiple Pain Sites No             Capillary Blood Glucose: No results found for this or any previous visit (from the past 24 hour(s)).    Social History   Tobacco Use  Smoking Status Never  Smokeless Tobacco Never    Goals Met:  Independence with exercise equipment Exercise tolerated well No report of concerns or symptoms today Strength training completed today  Goals Unmet:  Not Applicable  Comments: check out 0915   Dr. Dina Rich is Medical Director for [facility] Cardiac Rehab

## 2022-06-28 ENCOUNTER — Encounter (HOSPITAL_COMMUNITY)
Admission: RE | Admit: 2022-06-28 | Discharge: 2022-06-28 | Disposition: A | Discharge status: Home / Self Care | Payer: 59 | Source: Ambulatory Visit | Attending: Internal Medicine | Admitting: Internal Medicine

## 2022-06-28 DIAGNOSIS — Z955 Presence of coronary angioplasty implant and graft: Secondary | ICD-10-CM

## 2022-06-28 DIAGNOSIS — I214 Non-ST elevation (NSTEMI) myocardial infarction: Secondary | ICD-10-CM

## 2022-06-28 NOTE — Progress Notes (Signed)
Daily Session Note  Patient Details  Name: Michael Sandoval MRN: [id number] Date of Birth: Nov 29, 1965 Referring Provider:   Flowsheet Row CARDIAC REHAB PHASE II ORIENTATION from 04/25/2022 in [facility] CARDIAC REHABILITATION  Referring Provider Dr. Lynnette Caffey       Encounter Date: 06/28/2022  Check In:  Session Check In - 06/28/22 0804       Check-In   Supervising physician immediately available to respond to emergencies [facility] MD immediately available    Physician(s) Dr. Jenene Slicker    Location [facility]    Staff Present Cristopher Peru MHA, MS, ACSM-CEP;Nalin Mazzocco Laural Benes, RN, Pleas Koch, RN, BSN    Virtual Visit No    Medication changes reported     No    Fall or balance concerns reported    Yes    Comments pt had 1 fall in the last year, but he is having lightheadedness/dizziness intermittently when he stands up or with activity    Tobacco Cessation No Change    Warm-up and Cool-down Performed as group-led instruction    Resistance Training Performed Yes    VAD Patient? No    PAD/SET Patient? No      Pain Assessment   Currently in Pain? No/denies    Pain Score 0-No pain    Multiple Pain Sites No             Capillary Blood Glucose: No results found for this or any previous visit (from the past 24 hour(s)).    Social History   Tobacco Use  Smoking Status Never  Smokeless Tobacco Never    Goals Met:  Independence with exercise equipment Exercise tolerated well No report of concerns or symptoms today Strength training completed today  Goals Unmet:  Not Applicable  Comments: Check out 915.   Dr. Dina Rich is Medical Director for [facility]

## 2022-07-01 ENCOUNTER — Encounter (HOSPITAL_COMMUNITY)
Admission: RE | Admit: 2022-07-01 | Discharge: 2022-07-01 | Disposition: A | Discharge status: Home / Self Care | Payer: 59 | Source: Ambulatory Visit | Attending: Internal Medicine | Admitting: Internal Medicine

## 2022-07-01 VITALS — Wt 242.5 lb

## 2022-07-01 DIAGNOSIS — I214 Non-ST elevation (NSTEMI) myocardial infarction: Secondary | ICD-10-CM

## 2022-07-01 DIAGNOSIS — Z955 Presence of coronary angioplasty implant and graft: Secondary | ICD-10-CM

## 2022-07-01 NOTE — Progress Notes (Signed)
Daily Session Note  Patient Details  Name: RULON FREITAG MRN: [id number] Date of Birth: [birth year] Referring Provider:   Flowsheet Row CARDIAC REHAB PHASE II ORIENTATION from 04/25/2022 in [facility] CARDIAC REHABILITATION  Referring Provider Dr. Lynnette Caffey       Encounter Date: 07/01/2022  Check In:  Session Check In - 07/01/22 0815       Check-In   Supervising physician immediately available to respond to emergencies [facility] MD immediately available    Physician(s) Dr. Wyline Mood    Location [facility]    Staff Present Cristopher Peru MHA, MS, ACSM-CEP;Staci Righter, RN, BSN;Heather Fredric Mare, BS, Exercise Physiologist    Virtual Visit No    Medication changes reported     No    Fall or balance concerns reported    Yes    Comments pt had 1 fall in the last year, but he is having lightheadedness/dizziness intermittently when he stands up or with activity    Tobacco Cessation No Change    Warm-up and Cool-down Performed as group-led instruction    Resistance Training Performed Yes    VAD Patient? No    PAD/SET Patient? No      Pain Assessment   Currently in Pain? No/denies    Pain Score 0-No pain    Multiple Pain Sites No             Capillary Blood Glucose: No results found for this or any previous visit (from the past 24 hour(s)).    Social History   Tobacco Use  Smoking Status Never  Smokeless Tobacco Never    Goals Met:  Independence with exercise equipment Exercise tolerated well No report of concerns or symptoms today Strength training completed today  Goals Unmet:  Not Applicable  Comments: checkout time is 0915   Dr. Dina Rich is Medical Director for [facility]

## 2022-07-03 ENCOUNTER — Encounter (HOSPITAL_COMMUNITY)
Admission: RE | Admit: 2022-07-03 | Discharge: 2022-07-03 | Disposition: A | Discharge status: Home / Self Care | Payer: 59 | Source: Ambulatory Visit | Attending: Internal Medicine | Admitting: Internal Medicine

## 2022-07-03 DIAGNOSIS — I214 Non-ST elevation (NSTEMI) myocardial infarction: Secondary | ICD-10-CM

## 2022-07-03 DIAGNOSIS — Z955 Presence of coronary angioplasty implant and graft: Secondary | ICD-10-CM

## 2022-07-03 NOTE — Progress Notes (Signed)
Cardiac Individual Treatment Plan  Patient Details  Name: Michael Sandoval MRN: [id number] Date of Birth: 11/17/1965 Referring Provider:   Flowsheet Row CARDIAC REHAB PHASE II ORIENTATION from 04/25/2022 in [facility] CARDIAC REHABILITATION  Referring Provider Dr. Lynnette Caffey       Initial Encounter Date:  Flowsheet Row CARDIAC REHAB PHASE II ORIENTATION from 04/25/2022 in [place] CARDIAC REHABILITATION  Date 04/25/22       Visit Diagnosis: NSTEMI (non-ST elevated myocardial infarction) [facility])  Status post coronary artery stent placement  Patient's Home Medications on Admission:  Current Outpatient Medications:    aspirin EC 81 MG tablet, Take 81 mg by mouth daily. Swallow whole., Disp: , Rfl:    cetirizine (ZYRTEC) 10 MG tablet, Take 10 mg by mouth as needed for allergies., Disp: , Rfl:    clopidogrel (PLAVIX) 75 MG tablet, Take 1 tablet (75 mg total) by mouth daily., Disp: 90 tablet, Rfl: 3   ezetimibe (ZETIA) 10 MG tablet, Take 1 tablet (10 mg total) by mouth daily., Disp: 90 tablet, Rfl: 3   fluticasone (FLONASE) 50 MCG/ACT nasal spray, Place 1 spray into both nostrils daily as needed for allergies., Disp: , Rfl:    glucose blood test strip, 1 each by Other route as needed for other. Use as instructed One Touch Verio (Blood Glucose Test) One Touch Delica Lancets 33G, Disp: , Rfl:    metoprolol succinate (TOPROL XL) 25 MG 24 hr tablet, Take 0.5 tablets (12.5 mg total) by mouth daily., Disp: 45 tablet, Rfl: 3   MOUNJARO 2.5 MG/0.5ML Pen, Inject 2.5 mg into the skin once a week., Disp: , Rfl:    Multiple Vitamins-Minerals (MULTIVITAMIN WITH MINERALS) tablet, Take 1 tablet by mouth daily., Disp: , Rfl:    nitroGLYCERIN (NITROSTAT) 0.4 MG SL tablet, Place 1 tablet (0.4 mg total) under the tongue every 5 (five) minutes as needed for chest pain., Disp: 25 tablet, Rfl: 3   NOVOLIN 70/30 KWIKPEN (70-30) 100 UNIT/ML KwikPen, Inject 15 Units into the skin 2 (two) times daily., Disp: , Rfl:     omeprazole (PRILOSEC OTC) 20 MG tablet, Take 1 tablet by mouth daily as needed (for acid reflux)., Disp: , Rfl:    rosuvastatin (CRESTOR) 10 MG tablet, Take 1 tablet (10 mg total) by mouth daily., Disp: 90 tablet, Rfl: 3   venlafaxine XR (EFFEXOR-XR) 150 MG 24 hr capsule, Take 150 mg by mouth daily., Disp: , Rfl:    XIGDUO XR 06-998 MG TB24, Take 1 tablet by mouth 2 (two) times daily., Disp: , Rfl:   Past Medical History: Past Medical History:  Diagnosis Date   Anxiety    Arthritis    Cancer (HCC)    melanoma skin cancer    Daytime somnolence    Decreased hearing    Depression    Diabetes mellitus, type II (HCC)        Dyslipidemia    Elevated blood pressure reading    Elevated coronary artery calcium score    Excessive daytime sleepiness    GERD (gastroesophageal reflux disease)    HTN (hypertension)    HTN (hypertension)    Hyperlipidemia    Lightheadedness    Non-restorative sleep    Obesity    Osteoarthritis of hip    Pneumonia    1990    Sinusitis    Unspecified disorder of nose and nasal sinuses     Tobacco Use: Social History   Tobacco Use  Smoking Status Never  Smokeless Tobacco Never  Labs: Review Flowsheet       Latest Ref Rng & Units 06/30/2018 08/20/2021 01/07/2022  Labs for ITP Cardiac and Pulmonary Rehab  Cholestrol 100 - 199 mg/dL - 161  096   LDL (calc) 0 - 99 mg/dL - 045  29   HDL-C >40 mg/dL - 48  51   Trlycerides 0 - 149 mg/dL - 981  191   Hemoglobin A1c 4.8 - 5.6 % 6.4  - -    Capillary Blood Glucose: Lab Results  Component Value Date   GLUCAP 155 (H) 09/12/2021   GLUCAP 163 (H) 09/12/2021   GLUCAP 196 (H) 09/11/2021   GLUCAP 215 (H) 09/11/2021   GLUCAP 275 (H) 09/11/2021     Exercise Target Goals: Exercise Program Goal: Individual exercise prescription set using results from initial 6 min walk test and THRR while considering  patient's activity barriers and safety.   Exercise Prescription Goal: Starting with aerobic activity  30 plus minutes a day, 3 days per week for initial exercise prescription. Provide home exercise prescription and guidelines that participant acknowledges understanding prior to discharge.  Activity Barriers & Risk Stratification:  Activity Barriers & Cardiac Risk Stratification - 04/25/22 1259       Activity Barriers & Cardiac Risk Stratification   Activity Barriers Joint Problems;Chest Pain/Angina;Balance Concerns;Right Hip Replacement;Deconditioning;History of Falls;Muscular Weakness;Shortness of Breath    Cardiac Risk Stratification High             6 Minute Walk:  6 Minute Walk     Row Name 04/25/22 1412         6 Minute Walk   Phase Initial     Distance 1525 feet     Walk Time 6 minutes     # of Rest Breaks 0     MPH 2.89     METS 3.47     RPE 11     VO2 Peak 12.15     Symptoms No     Resting HR 70 bpm     Resting BP 112/64     Resting Oxygen Saturation  97 %     Exercise Oxygen Saturation  during 6 min walk 99 %     Max Ex. HR 104 bpm     Max Ex. BP 142/74     2 Minute Post BP 124/70              Oxygen Initial Assessment:   Oxygen Re-Evaluation:   Oxygen Discharge (Final Oxygen Re-Evaluation):   Initial Exercise Prescription:  Initial Exercise Prescription - 04/25/22 1400       Date of Initial Exercise RX and Referring Provider   Date 04/25/22    Referring Provider Dr. Lynnette Caffey    Expected Discharge Date 07/12/22      Treadmill   MPH 2.4    Grade 0    Minutes 17      Recumbant Elliptical   Level 1    RPM 60    Minutes 22      Prescription Details   Frequency (times per week) 3    Duration Progress to 30 minutes of continuous aerobic without signs/symptoms of physical distress      Intensity   THRR 40-80% of Max Heartrate 65-130    Ratings of Perceived Exertion 11-13    Perceived Dyspnea 0-4      Resistance Training   Training Prescription Yes    Weight 4    Reps 10-15  Perform Capillary Blood Glucose  checks as needed.  Exercise Prescription Changes:   Exercise Prescription Changes     Row Name 05/20/22 1000 06/03/22 1300 06/17/22 1100 06/19/22 1100 07/01/22 1100     Response to Exercise   Blood Pressure (Admit) 120/62 122/70 114/68 -- 114/72   Blood Pressure (Exercise) 140/62 130/60 130/62 -- 112/70   Blood Pressure (Exit) 110/66 118/60 116/62 -- 122/60   Heart Rate (Admit) 85 bpm 87 bpm 91 bpm -- 72 bpm   Heart Rate (Exercise) 100 bpm 120 bpm 107 bpm -- 118 bpm   Heart Rate (Exit) 94 bpm 96 bpm 95 bpm -- 87 bpm   Rating of Perceived Exertion (Exercise) 12 13 12  -- --   Duration Continue with 30 min of aerobic exercise without signs/symptoms of physical distress. Continue with 30 min of aerobic exercise without signs/symptoms of physical distress. Continue with 30 min of aerobic exercise without signs/symptoms of physical distress. -- --   Intensity THRR unchanged THRR unchanged THRR unchanged -- --     Progression   Progression Continue to progress workloads to maintain intensity without signs/symptoms of physical distress. Continue to progress workloads to maintain intensity without signs/symptoms of physical distress. Continue to progress workloads to maintain intensity without signs/symptoms of physical distress. -- --     Paramedic Prescription Yes Yes Yes -- --   Weight 4 3 3  -- --   Reps 10-15 10-15 10-15 -- --   Time 10 Minutes 10 Minutes 10 Minutes -- --     Treadmill   MPH 2.6 2.8 2.6 -- --   Grade 0 1 1 -- --   Minutes 17 17 17  -- --   METs 2.99 3.53 3.35 -- --     Recumbant Elliptical   Level 3 4 5  -- --   RPM 55 70 67 -- --   Minutes 22 22 22  -- --   METs 2.8 3.5 3.6 -- --     Home Exercise Plan   Plans to continue exercise at -- -- -- Home (comment) --   Frequency -- -- -- Add 2 additional days to program exercise sessions. --   Initial Home Exercises Provided -- -- -- 06/19/22 --            Exercise Comments:   Exercise  Comments     Row Name 06/19/22 1137           Exercise Comments home exercise reviewed                Exercise Goals and Review:   Exercise Goals     Row Name 04/25/22 1414 05/06/22 1112 06/03/22 1315         Exercise Goals   Increase Physical Activity Yes Yes Yes     Intervention Provide advice, education, support and counseling about physical activity/exercise needs.;Develop an individualized exercise prescription for aerobic and resistive training based on initial evaluation findings, risk stratification, comorbidities and participant's personal goals. Provide advice, education, support and counseling about physical activity/exercise needs.;Develop an individualized exercise prescription for aerobic and resistive training based on initial evaluation findings, risk stratification, comorbidities and participant's personal goals. Provide advice, education, support and counseling about physical activity/exercise needs.;Develop an individualized exercise prescription for aerobic and resistive training based on initial evaluation findings, risk stratification, comorbidities and participant's personal goals.     Expected Outcomes Short Term: Attend rehab on a regular basis to increase amount of physical activity.;Long Term: Add in  home exercise to make exercise part of routine and to increase amount of physical activity.;Long Term: Exercising regularly at least 3-5 days a week. Short Term: Attend rehab on a regular basis to increase amount of physical activity.;Long Term: Add in home exercise to make exercise part of routine and to increase amount of physical activity.;Long Term: Exercising regularly at least 3-5 days a week. Short Term: Attend rehab on a regular basis to increase amount of physical activity.;Long Term: Add in home exercise to make exercise part of routine and to increase amount of physical activity.;Long Term: Exercising regularly at least 3-5 days a week.     Increase  Strength and Stamina Yes Yes Yes     Intervention Provide advice, education, support and counseling about physical activity/exercise needs.;Develop an individualized exercise prescription for aerobic and resistive training based on initial evaluation findings, risk stratification, comorbidities and participant's personal goals. Provide advice, education, support and counseling about physical activity/exercise needs.;Develop an individualized exercise prescription for aerobic and resistive training based on initial evaluation findings, risk stratification, comorbidities and participant's personal goals. Provide advice, education, support and counseling about physical activity/exercise needs.;Develop an individualized exercise prescription for aerobic and resistive training based on initial evaluation findings, risk stratification, comorbidities and participant's personal goals.     Expected Outcomes Short Term: Increase workloads from initial exercise prescription for resistance, speed, and METs.;Short Term: Perform resistance training exercises routinely during rehab and add in resistance training at home;Long Term: Improve cardiorespiratory fitness, muscular endurance and strength as measured by increased METs and functional capacity ( ) Short Term: Increase workloads from initial exercise prescription for resistance, speed, and METs.;Short Term: Perform resistance training exercises routinely during rehab and add in resistance training at home;Long Term: Improve cardiorespiratory fitness, muscular endurance and strength as measured by increased METs and functional capacity ( ) Short Term: Increase workloads from initial exercise prescription for resistance, speed, and METs.;Short Term: Perform resistance training exercises routinely during rehab and add in resistance training at home;Long Term: Improve cardiorespiratory fitness, muscular endurance and strength as measured by increased METs and functional  capacity ( )     Able to understand and use rate of perceived exertion (RPE) scale Yes Yes Yes     Intervention Provide education and explanation on how to use RPE scale Provide education and explanation on how to use RPE scale Provide education and explanation on how to use RPE scale     Expected Outcomes Short Term: Able to use RPE daily in rehab to express subjective intensity level;Long Term:  Able to use RPE to guide intensity level when exercising independently Short Term: Able to use RPE daily in rehab to express subjective intensity level;Long Term:  Able to use RPE to guide intensity level when exercising independently Short Term: Able to use RPE daily in rehab to express subjective intensity level;Long Term:  Able to use RPE to guide intensity level when exercising independently     Knowledge and understanding of Target Heart Rate Range (THRR) Yes Yes Yes     Intervention Provide education and explanation of THRR including how the numbers were predicted and where they are located for reference Provide education and explanation of THRR including how the numbers were predicted and where they are located for reference Provide education and explanation of THRR including how the numbers were predicted and where they are located for reference     Expected Outcomes Short Term: Able to state/look up THRR;Long Term: Able to use THRR to govern intensity when exercising independently;Short  Term: Able to use daily as guideline for intensity in rehab Short Term: Able to state/look up THRR;Long Term: Able to use THRR to govern intensity when exercising independently;Short Term: Able to use daily as guideline for intensity in rehab Short Term: Able to state/look up THRR;Long Term: Able to use THRR to govern intensity when exercising independently;Short Term: Able to use daily as guideline for intensity in rehab     Able to check pulse independently Yes Yes Yes     Intervention Provide education and  demonstration on how to check pulse in carotid and radial arteries.;Review the importance of being able to check your own pulse for safety during independent exercise Provide education and demonstration on how to check pulse in carotid and radial arteries.;Review the importance of being able to check your own pulse for safety during independent exercise Provide education and demonstration on how to check pulse in carotid and radial arteries.;Review the importance of being able to check your own pulse for safety during independent exercise     Expected Outcomes Long Term: Able to check pulse independently and accurately;Short Term: Able to explain why pulse checking is important during independent exercise Long Term: Able to check pulse independently and accurately;Short Term: Able to explain why pulse checking is important during independent exercise Long Term: Able to check pulse independently and accurately;Short Term: Able to explain why pulse checking is important during independent exercise     Understanding of Exercise Prescription Yes Yes Yes     Intervention Provide education, explanation, and written materials on patient's individual exercise prescription Provide education, explanation, and written materials on patient's individual exercise prescription Provide education, explanation, and written materials on patient's individual exercise prescription     Expected Outcomes Short Term: Able to explain program exercise prescription;Long Term: Able to explain home exercise prescription to exercise independently Short Term: Able to explain program exercise prescription;Long Term: Able to explain home exercise prescription to exercise independently Short Term: Able to explain program exercise prescription;Long Term: Able to explain home exercise prescription to exercise independently              Exercise Goals Re-Evaluation :  Exercise Goals Re-Evaluation     Row Name 05/06/22 1113 06/03/22 1315            Exercise Goal Re-Evaluation   Exercise Goals Review Increase Physical Activity;Increase Strength and Stamina;Able to understand and use rate of perceived exertion (RPE) scale;Knowledge and understanding of Target Heart Rate Range (THRR);Able to check pulse independently;Understanding of Exercise Prescription Increase Physical Activity;Increase Strength and Stamina;Able to understand and use rate of perceived exertion (RPE) scale;Knowledge and understanding of Target Heart Rate Range (THRR);Able to check pulse independently;Understanding of Exercise Prescription      Comments Pt completed orientation on 04/25/2022, and he will begin the program on 05/13/2022. Unable to assess progress at this time. Pt has completed 10 sessions pf CR. He is very motivated and egar to increase his workloads. He is currently exercising at 3.5 METs on the treadmill. Will contiunue to monitor and progress as able.      Expected Outcomes Through exercise at rehab and at home, the patient will meet their stated goals. Through exercise at rehab and at home, the patient will meet their stated goals.                Discharge Exercise Prescription (Final Exercise Prescription Changes):  Exercise Prescription Changes - 07/01/22 1100       Response to Exercise  Blood Pressure (Admit) 114/72    Blood Pressure (Exercise) 112/70    Blood Pressure (Exit) 122/60    Heart Rate (Admit) 72 bpm    Heart Rate (Exercise) 118 bpm    Heart Rate (Exit) 87 bpm             Nutrition:  Target Goals: Understanding of nutrition guidelines, daily intake of sodium 1500mg , cholesterol 200mg , calories 30% from fat and 7% or less from saturated fats, daily to have 5 or more servings of fruits and vegetables.  Biometrics:  Pre Biometrics - 04/25/22 1415       Pre Biometrics   Height 5\' 9"  (1.753 m)    Weight 116.2 kg    Waist Circumference 49 inches    Hip Circumference 46 inches    Waist to Hip Ratio 1.07 %     BMI (Calculated) 37.81    Triceps Skinfold 34 mm    % Body Fat 38.6 %    Grip Strength 46.6 kg    Flexibility 0 in    Single Leg Stand 15 seconds              Nutrition Therapy Plan and Nutrition Goals:  Nutrition Therapy & Goals - 04/25/22 1452       Personal Nutrition Goals   Comments Patient scored 104 on his diet assessment. We have educational sessions on heart heathy nutrition with handouts and offer assistance with RD referral if patient is interested.      Intervention Plan   Intervention Nutrition handout(s) given to patient.    Expected Outcomes Short Term Goal: Understand basic principles of dietary content, such as calories, fat, sodium, cholesterol and nutrients.             Nutrition Assessments:  Nutrition Assessments - 04/25/22 1311       MEDFICTS Scores   Pre Score 104            MEDIFICTS Score Key: ?70 Need to make dietary changes  40-70 Heart Healthy Diet ? 40 Therapeutic Level Cholesterol Diet   Picture Your Plate Scores: <16 Unhealthy dietary pattern with much room for improvement. 41-50 Dietary pattern unlikely to meet recommendations for good health and room for improvement. 51-60 More healthful dietary pattern, with some room for improvement.  >60 Healthy dietary pattern, although there may be some specific behaviors that could be improved.    Nutrition Goals Re-Evaluation:   Nutrition Goals Discharge (Final Nutrition Goals Re-Evaluation):   Psychosocial: Target Goals: Acknowledge presence or absence of significant depression and/or stress, maximize coping skills, provide positive support system. Participant is able to verbalize types and ability to use techniques and skills needed for reducing stress and depression.  Initial Review & Psychosocial Screening:  Initial Psych Review & Screening - 05/29/22 1229       Initial Review   Source of Stress Concerns --    Comments --             Quality of Life Scores:   Quality of Life - 04/25/22 1411       Quality of Life   Select Quality of Life      Quality of Life Scores   Health/Function Pre 15.43 %    Socioeconomic Pre 26.56 %    Psych/Spiritual Pre 18.79 %    Family Pre 22.5 %    GLOBAL Pre 19.52 %            Scores of 19 and below usually indicate a poorer  quality of life in these areas.  A difference of  2-3 points is a clinically meaningful difference.  A difference of 2-3 points in the total score of the Quality of Life Index has been associated with significant improvement in overall quality of life, self-image, physical symptoms, and general health in studies assessing change in quality of life.  PHQ-9: Review Flowsheet       04/25/2022  Depression screen PHQ 2/9  Decreased Interest 2  Down, Depressed, Hopeless 0  PHQ - 2 Score 2  Altered sleeping 1  Tired, decreased energy 1  Change in appetite 1  Feeling bad or failure about yourself  1  Trouble concentrating 3  Moving slowly or fidgety/restless 0  Suicidal thoughts 0  PHQ-9 Score 9  Difficult doing work/chores Somewhat difficult   Interpretation of Total Score  Total Score Depression Severity:  1-4 = Minimal depression, 5-9 = Mild depression, 10-14 = Moderate depression, 15-19 = Moderately severe depression, 20-27 = Severe depression   Psychosocial Evaluation and Intervention:  Psychosocial Evaluation - 04/25/22 1421       Psychosocial Evaluation & Interventions   Interventions Relaxation education;Encouraged to exercise with the program and follow exercise prescription;Stress management education    Comments Pt has no barriers to completing the CR program.  He has a history of depression/anxiety, but per the pt this is managed well with Venlafaxine.  His job is stressful due to traveling, but he is eager to start the program and get healthier, which he feels will help with stress management.  He does have a good support system with his friends.  He scored a 9 on his  PHQ-9 due to his fatigue, sleep problems, concentration issues, and feeling bad about his weight gain.  He mentioned that he used to be in the [employer] and was in very good shape, so he is discouraged with his current weight.  He has been taking steps over the past few months to become healthier by exercising at home and eating healthier.  Pt is very eager to start the program to reach his goals of losing more weight and being healthier overall.    Expected Outcomes Depression/anxiety will continue to be managed with Venlafaxine, and the pt will continue to manage his stressors.  Pt will continue to have no identifiable psychosocial barriers.    Continue Psychosocial Services  No Follow up required             Psychosocial Re-Evaluation:  Psychosocial Re-Evaluation     Row Name 04/29/22 1435 05/29/22 1229 06/26/22 0841         Psychosocial Re-Evaluation   Current issues with Current Psychotropic Meds;Current Stress Concerns;Current Sleep Concerns;Current Anxiety/Panic;Current Depression Current Psychotropic Meds;Current Stress Concerns;Current Sleep Concerns;Current Anxiety/Panic;Current Depression Current Psychotropic Meds;Current Stress Concerns;Current Sleep Concerns;Current Anxiety/Panic;Current Depression     Comments Patient has not started the program. He plans to start in April due to his work schedule. We will continue to montior his progress. Patient has completed 7 sessions. He is doing well. His depression, anxiety and stress contiune to be managed with Venlafazine. He seems to enjoy the sessions and demonstrates an interesst in improving his health. We will continue to monitor his progress. Patient has completed 19 sessions. He is doing well. His depression, anxiety and stress contiune to be managed with Venlafazine. He continues to enjoy the sessions and demonstrates an interesst in improving his health. We will continue to monitor his progress.     Expected Outcomes Patient will  continue to have no psychosocial barriers identified and his Depression/anxiety/stress will continue to be managed with Venlafazxine. Patient will continue to have no psychosocial barriers identified and his Depression/anxiety/stress will continue to be managed with Venlafazxine. Patient will continue to have no psychosocial barriers identified and his Depression/anxiety/stress will continue to be managed with Venlafazxine.     Interventions Stress management education;Encouraged to attend Cardiac Rehabilitation for the exercise;Relaxation education Stress management education;Encouraged to attend Cardiac Rehabilitation for the exercise;Relaxation education Stress management education;Encouraged to attend Cardiac Rehabilitation for the exercise;Relaxation education     Continue Psychosocial Services  No Follow up required No Follow up required No Follow up required       Initial Review   Source of Stress Concerns Occupation;Chronic Illness -- Occupation;Chronic Illness     Comments pt is having to travel now for his job -- pt is having to travel now for his job              Psychosocial Discharge (Final Psychosocial Re-Evaluation):  Psychosocial Re-Evaluation - 06/26/22 0841       Psychosocial Re-Evaluation   Current issues with Current Psychotropic Meds;Current Stress Concerns;Current Sleep Concerns;Current Anxiety/Panic;Current Depression    Comments Patient has completed 19 sessions. He is doing well. His depression, anxiety and stress contiune to be managed with Venlafazine. He continues to enjoy the sessions and demonstrates an interesst in improving his health. We will continue to monitor his progress.    Expected Outcomes Patient will continue to have no psychosocial barriers identified and his Depression/anxiety/stress will continue to be managed with Venlafazxine.    Interventions Stress management education;Encouraged to attend Cardiac Rehabilitation for the exercise;Relaxation  education    Continue Psychosocial Services  No Follow up required      Initial Review   Source of Stress Concerns Occupation;Chronic Illness    Comments pt is having to travel now for his job             Vocational Rehabilitation: Provide vocational rehab assistance to qualifying candidates.   Vocational Rehab Evaluation & Intervention:  Vocational Rehab - 04/25/22 1325       Initial Vocational Rehab Evaluation & Intervention   Assessment shows need for Vocational Rehabilitation No      Vocational Rehab Re-Evaulation   Comments pt has already returned to work             Education: Education Goals: Education classes will be provided on a weekly basis, covering required topics. Participant will state understanding/return demonstration of topics presented.  Learning Barriers/Preferences:  Learning Barriers/Preferences - 04/25/22 1314       Learning Barriers/Preferences   Learning Barriers Hearing    Learning Preferences Skilled Demonstration             Education Topics: Hypertension, Hypertension Reduction -Define heart disease and high blood pressure. Discus how high blood pressure affects the body and ways to reduce high blood pressure. Flowsheet Row CARDIAC REHAB PHASE II EXERCISE from 06/26/2022 in [place] CARDIAC REHABILITATION  Date 05/22/22  Educator HB  Instruction Review Code 1- Verbalizes Understanding       Exercise and Your Heart -Discuss why it is important to exercise, the FITT principles of exercise, normal and abnormal responses to exercise, and how to exercise safely. Flowsheet Row CARDIAC REHAB PHASE II EXERCISE from 06/26/2022 in [place] CARDIAC REHABILITATION  Date 05/29/22  Educator HB  Instruction Review Code 1- Verbalizes Understanding       Angina -Discuss definition of angina, causes  of angina, treatment of angina, and how to decrease risk of having angina. Flowsheet Row CARDIAC REHAB PHASE II EXERCISE from 06/26/2022  in [place] CARDIAC REHABILITATION  Date 06/05/22  Educator HB  Instruction Review Code 2- Demonstrated Understanding       Cardiac Medications -Review what the following cardiac medications are used for, how they affect the body, and side effects that may occur when taking the medications.  Medications include Aspirin, Beta blockers, calcium channel blockers, ACE Inhibitors, angiotensin receptor blockers, diuretics, digoxin, and antihyperlipidemics.   Congestive Heart Failure -Discuss the definition of CHF, how to live with CHF, the signs and symptoms of CHF, and how keep track of weight and sodium intake.   Heart Disease and Intimacy -Discus the effect sexual activity has on the heart, how changes occur during intimacy as we age, and safety during sexual activity. Flowsheet Row CARDIAC REHAB PHASE II EXERCISE from 06/26/2022 in [place] CARDIAC REHABILITATION  Date 06/19/22  Educator HB  Instruction Review Code 1- Verbalizes Understanding       Smoking Cessation / COPD -Discuss different methods to quit smoking, the health benefits of quitting smoking, and the definition of COPD. Flowsheet Row CARDIAC REHAB PHASE II EXERCISE from 06/26/2022 in [place] CARDIAC REHABILITATION  Date 06/26/22  Educator hb  Instruction Review Code 2- Demonstrated Understanding       Nutrition I: Fats -Discuss the types of cholesterol, what cholesterol does to the heart, and how cholesterol levels can be controlled.   Nutrition II: Labels -Discuss the different components of food labels and how to read food label   Heart Parts/Heart Disease and PAD -Discuss the anatomy of the heart, the pathway of blood circulation through the heart, and these are affected by heart disease.   Stress I: Signs and Symptoms -Discuss the causes of stress, how stress may lead to anxiety and depression, and ways to limit stress.   Stress II: Relaxation -Discuss different types of relaxation techniques  to limit stress.   Warning Signs of Stroke / TIA -Discuss definition of a stroke, what the signs and symptoms are of a stroke, and how to identify when someone is having stroke. Flowsheet Row CARDIAC REHAB PHASE II EXERCISE from 06/26/2022 in [place] CARDIAC REHABILITATION  Date 05/15/22  Educator DF  Instruction Review Code 2- Demonstrated Understanding       Knowledge Questionnaire Score:  Knowledge Questionnaire Score - 04/25/22 1316       Knowledge Questionnaire Score   Pre Score 21/24             Core Components/Risk Factors/Patient Goals at Admission:  Personal Goals and Risk Factors at Admission - 04/25/22 1326       Core Components/Risk Factors/Patient Goals on Admission    Weight Management Yes;Weight Loss    Intervention Weight Management: Develop a combined nutrition and exercise program designed to reach desired caloric intake, while maintaining appropriate intake of nutrient and fiber, sodium and fats, and appropriate energy expenditure required for the weight goal.;Weight Management: Provide education and appropriate resources to help participant work on and attain dietary goals.;Weight Management/Obesity: Establish reasonable short term and long term weight goals.;Obesity: Provide education and appropriate resources to help participant work on and attain dietary goals.    Improve shortness of breath with ADL's Yes    Intervention Provide education, individualized exercise plan and daily activity instruction to help decrease symptoms of SOB with activities of daily living.    Expected Outcomes Short Term: Improve cardiorespiratory  fitness to achieve a reduction of symptoms when performing ADLs;Long Term: Be able to perform more ADLs without symptoms or delay the onset of symptoms    Diabetes Yes    Intervention Provide education about signs/symptoms and action to take for hypo/hyperglycemia.;Provide education about proper nutrition, including hydration, and  aerobic/resistive exercise prescription along with prescribed medications to achieve blood glucose in normal ranges: Fasting glucose 65-99 mg/dL    Expected Outcomes Short Term: Participant verbalizes understanding of the signs/symptoms and immediate care of hyper/hypoglycemia, proper foot care and importance of medication, aerobic/resistive exercise and nutrition plan for blood glucose control.;Long Term: Attainment of HbA1C < 7%.    Lipids Yes    Intervention Provide education and support for participant on nutrition & aerobic/resistive exercise along with prescribed medications to achieve LDL 70mg , HDL >40mg .    Expected Outcomes Short Term: Participant states understanding of desired cholesterol values and is compliant with medications prescribed. Participant is following exercise prescription and nutrition guidelines.;Long Term: Cholesterol controlled with medications as prescribed, with individualized exercise RX and with personalized nutrition plan. Value goals: LDL < 70mg , HDL > 40 mg.    Stress Yes    Intervention Offer individual and/or small group education and counseling on adjustment to heart disease, stress management and health-related lifestyle change. Teach and support self-help strategies.;Refer participants experiencing significant psychosocial distress to appropriate mental health specialists for further evaluation and treatment. When possible, include family members and significant others in education/counseling sessions.    Expected Outcomes Short Term: Participant demonstrates changes in health-related behavior, relaxation and other stress management skills, ability to obtain effective social support, and compliance with psychotropic medications if prescribed.;Long Term: Emotional wellbeing is indicated by absence of clinically significant psychosocial distress or social isolation.    Personal Goal Other Yes    Personal Goal improve his overall health    Intervention Pt will attend  CR 3 times a week and continue to exercise at home.    Expected Outcomes Pt will attend classes and meet both personal and program goals.             Core Components/Risk Factors/Patient Goals Review:   Goals and Risk Factor Review     Row Name 04/29/22 1437 05/29/22 1235 06/26/22 0842         Core Components/Risk Factors/Patient Goals Review   Personal Goals Review Weight Management/Obesity;Improve shortness of breath with ADL's;Diabetes;Lipids;Other;Stress Weight Management/Obesity;Improve shortness of breath with ADL's;Diabetes;Lipids;Other;Stress Weight Management/Obesity;Improve shortness of breath with ADL's;Diabetes;Lipids;Other;Stress     Review Patient was referred to CR with NSTEMI. He has multiple risk factors for CAD and is participating in the program for risk modification. He plans to start the program in April. His DM is managed with Insulin and mounjaro. His last A1C on file was 09/05/21 at 10.8%. His personal goals for the program are to lose weight and get healthier overall. We will continue to monitor his progress as he works towards meeting these goals. Patient has completed 7 sessions. His current weight is 254.0 lbs down 2 lbs from his orientation weight. He is doing well in the program with consistent attendance and progressions. His blood pressure is at goal. His DM continues to be managed with Insulin and mounjaro. His last A1C on file was 09/05/21 at 10.8%. His personal goals for the program continue to be to lose weight and get healthier overall. We will continue to monitor his progress as he works towards meeting these goals. Patient has completed 19 sessions. His current weight is  256.0 lbs up 2 lbs since last 30 review. He continues to do well in the program with consistent attendance and progressions. His blood pressure continues to be at goal. His DM continues to be managed with Insulin and mounjaro. His last A1C on file was 09/05/21 at 10.8%. He came to CR 5/1  complaining feeling tired, dizzy, and nauseous. We did not allow him to exercise and advised he see his pcp. He says these symptoms are chronic for him. He did call his cardiologty Dr. Lynnette Caffey and he advised him to go to ED. He did go to ED. Diagnosed with vertigo. No changes made. He followed up with his PCP 5/4. No changes made. He His personal goals for the program continue to be to lose weight and get healthier overall. We will continue to monitor his progress as he works towards meeting these goals.     Expected Outcomes Patient will complete the program meeting both personal and program goals. Patient will complete the program meeting both personal and program goals. Patient will complete the program meeting both personal and program goals.              Core Components/Risk Factors/Patient Goals at Discharge (Final Review):   Goals and Risk Factor Review - 06/26/22 0842       Core Components/Risk Factors/Patient Goals Review   Personal Goals Review Weight Management/Obesity;Improve shortness of breath with ADL's;Diabetes;Lipids;Other;Stress    Review Patient has completed 19 sessions. His current weight is 256.0 lbs up 2 lbs since last 30 review. He continues to do well in the program with consistent attendance and progressions. His blood pressure continues to be at goal. His DM continues to be managed with Insulin and mounjaro. His last A1C on file was 09/05/21 at 10.8%. He came to CR 5/1 complaining feeling tired, dizzy, and nauseous. We did not allow him to exercise and advised he see his pcp. He says these symptoms are chronic for him. He did call his cardiologty Dr. Lynnette Caffey and he advised him to go to ED. He did go to ED. Diagnosed with vertigo. No changes made. He followed up with his PCP 5/4. No changes made. He His personal goals for the program continue to be to lose weight and get healthier overall. We will continue to monitor his progress as he works towards meeting these goals.     Expected Outcomes Patient will complete the program meeting both personal and program goals.             ITP Comments:   Comments: ITP REVIEW Pt is making expected progress toward Cardiac Rehab goals after completing 22 sessions. Recommend continued exercise, life style modification, education, and increased stamina and strength.

## 2022-07-03 NOTE — Progress Notes (Signed)
Daily Session Note  Patient Details  Name: Michael Sandoval MRN: [id number] Date of Birth: 10-28-1965 Referring Provider:   Flowsheet Row CARDIAC REHAB PHASE II ORIENTATION from 04/25/2022 in [facility] C[facility] RE[facility]  Referring Provider Dr. Lynnette Caffey       Encounter Date: 07/03/2022  Check In:  Session Check In - 07/03/22 0815       Check-In   Supervising physician immediately available to respond to emergencies [facility] MD immediately available    Physician(s) Dr. Dietrich Pates    Location [facility]    Staff Present Cristopher Peru MHA, MS, ACSM-CEP;Ross Ludwig, BS, Exercise Physiologist;Kimberly Coye Laural Benes, RN, BSN    Virtual Visit No    Medication changes reported     No    Fall or balance concerns reported    Yes    Comments pt had 1 fall in the last year, but he is having lightheadedness/dizziness intermittently when he stands up or with activity    Tobacco Cessation No Change    Warm-up and Cool-down Performed as group-led instruction    Resistance Training Performed Yes    VAD Patient? No    PAD/SET Patient? No      Pain Assessment   Currently in Pain? No/denies    Pain Score 0-No pain    Multiple Pain Sites No             Capillary Blood Glucose: No results found for this or any previous visit (from the past 24 hour(s)).    Social History   Tobacco Use  Smoking Status Never  Smokeless Tobacco Never    Goals Met:  Independence with exercise equipment Exercise tolerated well No report of concerns or symptoms today Strength training completed today  Goals Unmet:  Not Applicable  Comments: Check out 915.   Dr. Dina Rich is Medical Director for [facility]

## 2022-07-03 NOTE — Progress Notes (Signed)
Synopsis: Referred for DOE by Farris Has, MD  Subjective:   PATIENT ID: Michael Sandoval GENDER: male DOB: Dec 16, 1965, MRN: [id number]  Chief Complaint  Patient presents with   Pulmonary Consult    Referred by Dr. Farris Has.  Pt c/o DOE x 2 years, progressively getting worse to the point he is winded walking room to room at home.    [name] with hsitory of CAD, chronic sinusitis, GERD, DM2, OSA on CPAP  He says he has had gradually worsening dyspnea over the last couple of years. Just walking down a hall he can easily become winded. Has been trying to exercise much more. In July had heart attack and stent placement. He has no orthopnea. Has lost 30 lb on mounjaro. He has not tried any inhalers.   Had covid-19 twice, was not hospitalized.   Otherwise pertinent review of systems is negative.  He has no family history of lung disease  He works in Designer, industrial/product. He has no ongoing exposures to dusts/solvents/particulates without a mask. He never smoked, no vaping/mj. He has no pets.   Past Medical History:  Diagnosis Date   Anxiety    Arthritis    Cancer (HCC)    melanoma skin cancer    Daytime somnolence    Decreased hearing    Depression    Diabetes mellitus, type II (HCC)        Dyslipidemia    Elevated blood pressure reading    Elevated coronary artery calcium score    Excessive daytime sleepiness    GERD (gastroesophageal reflux disease)    HTN (hypertension)    HTN (hypertension)    Hyperlipidemia    Lightheadedness    Non-restorative sleep    Obesity    Osteoarthritis of hip    Pneumonia    1990    Sinusitis    Unspecified disorder of nose and nasal sinuses      Family History  Problem Relation Age of Onset   Heart attack Mother 34   Hypertension Father      Past Surgical History:  Procedure Laterality Date   colonoscopy   2006   CORONARY STENT INTERVENTION N/A 09/11/2021   Procedure: CORONARY STENT INTERVENTION;  Surgeon: Lennette Bihari, MD;   Location: [facility] INVASIVE CV LAB;  Service: Cardiovascular;  Laterality: N/A;   CORONARY ULTRASOUND/IVUS N/A 09/11/2021   Procedure: Intravascular Ultrasound/IVUS;  Surgeon: Lennette Bihari, MD;  Location: [facility] INVASIVE CV LAB;  Service: Cardiovascular;  Laterality: N/A;   HAND SURGERY     crush injury , small screw in place per surgeon    skin cancer   several years ago   TONSILLECTOMY AND ADENOIDECTOMY     TOTAL HIP ARTHROPLASTY Right 07/09/2018   Procedure: TOTAL HIP ARTHROPLASTY ANTERIOR APPROACH;  Surgeon: Samson Frederic, MD;  Location: [facility] ORS;  Service: Orthopedics;  Laterality: Right;    Social History   Socioeconomic History   Marital status: Single    Spouse name: Not on file   Number of children: Not on file   Years of education: Not on file   Highest education level: Not on file  Occupational History   Not on file  Tobacco Use   Smoking status: Never    Passive exposure: Past   Smokeless tobacco: Never  Vaping Use   Vaping Use: Never used  Substance and Sexual Activity   Alcohol use: Yes    Comment: rare   Drug use: No   Sexual activity: Not on  file  Other Topics Concern   Not on file  Social History Narrative   Works Designer, industrial/product.     Right handed    Social Determinants of Health   Financial Resource Strain: Not on file  Food Insecurity: Not on file  Transportation Needs: Not on file  Physical Activity: Not on file  Stress: Not on file  Social Connections: Not on file  Intimate Partner Violence: Not on file     Allergies  Allergen Reactions   Onion Diarrhea   Atorvastatin Diarrhea   Capsicum Diarrhea and Nausea Only    Bell peppers   Lactose Intolerance (Gi) Diarrhea     Outpatient Medications Prior to Visit  Medication Sig Dispense Refill   aspirin EC 81 MG tablet Take 81 mg by mouth daily. Swallow whole.     cetirizine (ZYRTEC) 10 MG tablet Take 10 mg by mouth as needed for allergies.     clopidogrel (PLAVIX) 75 MG tablet Take 1 tablet (75 mg  total) by mouth daily. 90 tablet 3   ezetimibe (ZETIA) 10 MG tablet Take 1 tablet (10 mg total) by mouth daily. 90 tablet 3   fluoruracil (CARAC) 0.5 % cream Apply topically daily.     fluticasone (FLONASE) 50 MCG/ACT nasal spray Place 1 spray into both nostrils daily as needed for allergies.     glucose blood test strip 1 each by Other route as needed for other. Use as instructed One Touch Verio (Blood Glucose Test) One Touch Delica Lancets 33G     metoprolol succinate (TOPROL XL) 25 MG 24 hr tablet Take 0.5 tablets (12.5 mg total) by mouth daily. 45 tablet 3   Multiple Vitamins-Minerals (MULTIVITAMIN WITH MINERALS) tablet Take 1 tablet by mouth daily.     nitroGLYCERIN (NITROSTAT) 0.4 MG SL tablet Place 1 tablet (0.4 mg total) under the tongue every 5 (five) minutes as needed for chest pain. 25 tablet 3   NOVOLIN 70/30 KWIKPEN (70-30) 100 UNIT/ML KwikPen Inject 15 Units into the skin 2 (two) times daily.     omeprazole (PRILOSEC OTC) 20 MG tablet Take 1 tablet by mouth daily as needed (for acid reflux).     rosuvastatin (CRESTOR) 10 MG tablet Take 1 tablet (10 mg total) by mouth daily. 90 tablet 3   venlafaxine XR (EFFEXOR-XR) 150 MG 24 hr capsule Take 150 mg by mouth daily.     XIGDUO XR 06-998 MG TB24 Take 1 tablet by mouth 2 (two) times daily.     MOUNJARO 2.5 MG/0.5ML Pen Inject 2.5 mg into the skin once a week.     No facility-administered medications prior to visit.       Objective:   Physical Exam:  General appearance: 57 y.o., male, NAD, conversant  Eyes: anicteric sclerae; PERRL, tracking appropriately HENT: NCAT; MMM Neck: Trachea midline; no lymphadenopathy, no JVD Lungs: CTAB, no crackles, no wheeze, with normal respiratory effort CV: RRR, no murmur  Abdomen: Soft, non-tender; non-distended, BS present  Extremities: No peripheral edema, warm Skin: Normal turgor and texture; no rash Psych: Appropriate affect Neuro: Alert and oriented to person and place, no focal  deficit     Vitals:   07/04/22 1529  BP: 110/68  Pulse: 69  Temp: 98.3 F (36.8 C)  TempSrc: Oral  SpO2: 99%  Weight: 246 lb (111.6 kg)  Height: 5\' 9"  (1.753 m)   99% on RA BMI Readings from Last 3 Encounters:  07/04/22 36.33 kg/m  07/01/22 35.81 kg/m  06/17/22 36.14 kg/m  Wt Readings from Last 3 Encounters:  07/04/22 246 lb (111.6 kg)  07/01/22 242 lb 8.1 oz (110 kg)  06/17/22 244 lb 11.4 oz (111 kg)     CBC    Component Value Date/Time   WBC 14.2 (H) 06/12/2022 1510   RBC 5.02 06/12/2022 1510   HGB 15.2 06/12/2022 1510   HCT 44.9 06/12/2022 1510   PLT 301 06/12/2022 1510   MCV 89.4 06/12/2022 1510   MCH 30.3 06/12/2022 1510   MCHC 33.9 06/12/2022 1510   RDW 14.1 06/12/2022 1510   LYMPHSABS 3.7 06/12/2022 1510   MONOABS 0.7 06/12/2022 1510   EOSABS 0.1 06/12/2022 1510   BASOSABS 0.1 06/12/2022 1510    Chest Imaging: CXR 06/12/22 unremarkable  CT lung bases 2017 unremarkable  Pulmonary Functions Testing Results:     No data to display          CPEX 10/03/21: 75% pred max VO2 with maximal effort though adjusted for body weight 84% pred. There was adequate ventilatory reserve and good O2 pulse. Habitus and deconditioning probably playing chief roles in dyspnea.     Echocardiogram:    1. Left ventricular ejection fraction, by estimation, is 55 to 60%. The  left ventricle has normal function. The left ventricle has no regional  wall motion abnormalities. There is mild left ventricular hypertrophy.  Left ventricular diastolic parameters  are consistent with Grade I diastolic dysfunction (impaired relaxation).   2. Right ventricular systolic function is normal. The right ventricular  size is normal. Tricuspid regurgitation signal is inadequate for assessing  PA pressure.   3. The mitral valve is normal in structure. No evidence of mitral valve  regurgitation. No evidence of mitral stenosis.   4. The aortic valve is tricuspid. Aortic valve  regurgitation is not  visualized. No aortic stenosis is present.   5. The inferior vena cava is normal in size with <50% respiratory  variability, suggesting right atrial pressure of 8 mmHg.      Assessment & Plan:   # DOE Unclear etiology. CPEX most supportive of deconditioning and there's no hint of underlying ventilatory issue - BR ok, appropriate decrease in ETCO2, gas exchange ok. Cardiac indices ok as well. Review of his CPAP download indicates excellent adherence and minimal residual AHI. However his DOE has persisted despite his effort at exercise. He is concerned about some sort of long haul covid issue.   Plan: - full PFT and clinic visit in 4 weeks  I spent 46 minutes dedicated to the care of this patient on the date of this encounter to include pre-visit review of records, face-to-face time with the patient discussing conditions above, post visit ordering of testing, clinical documentation with the electronic health record, making appropriate referrals as documented, and communicating necessary findings to members of the patients care team.     Omar Person, MD [facility] 07/04/2022 3:57 PM

## 2022-07-04 ENCOUNTER — Encounter: Payer: Self-pay | Admitting: Student

## 2022-07-04 ENCOUNTER — Ambulatory Visit (INDEPENDENT_AMBULATORY_CARE_PROVIDER_SITE_OTHER): Payer: 59 | Admitting: Student

## 2022-07-04 VITALS — BP 110/68 | HR 69 | Temp 98.3°F | Ht 69.0 in | Wt 246.0 lb

## 2022-07-04 DIAGNOSIS — R0609 Other forms of dyspnea: Secondary | ICD-10-CM | POA: Diagnosis not present

## 2022-07-04 NOTE — Patient Instructions (Signed)
-   you'll be called to schedule PFTs and I'll see you in a month to discuss results

## 2022-07-05 ENCOUNTER — Encounter (HOSPITAL_COMMUNITY)
Admission: RE | Admit: 2022-07-05 | Discharge: 2022-07-05 | Disposition: A | Discharge status: Home / Self Care | Payer: 59 | Source: Ambulatory Visit | Attending: Internal Medicine | Admitting: Internal Medicine

## 2022-07-05 VITALS — Ht 69.0 in | Wt 243.8 lb

## 2022-07-05 DIAGNOSIS — Z955 Presence of coronary angioplasty implant and graft: Secondary | ICD-10-CM

## 2022-07-05 DIAGNOSIS — I214 Non-ST elevation (NSTEMI) myocardial infarction: Secondary | ICD-10-CM | POA: Diagnosis not present

## 2022-07-05 NOTE — Progress Notes (Signed)
Daily Session Note  Patient Details  Name: Michael Sandoval MRN: [id number] Date of Birth: [birth year] Referring Provider:   Flowsheet Row CARDIAC REHAB PHASE II ORIENTATION from 04/25/2022 in [facility] CARDIAC REHABILITATION  Referring Provider Dr. Lynnette Caffey       Encounter Date: 07/05/2022  Check In:  Session Check In - 07/05/22 0815       Check-In   Supervising physician immediately available to respond to emergencies [facility] MD immediately available    Physician(s) Dr. Rennis Golden    Location [facility]    Staff Present Ross Ludwig, BS, Exercise Physiologist;Dalton Debbe Mounts, MS, ACSM-CEP;Cotina Freedman Laural Benes, RN, Pleas Koch, RN, BSN    Virtual Visit No    Medication changes reported     No    Fall or balance concerns reported    Yes    Comments pt had 1 fall in the last year, but he is having lightheadedness/dizziness intermittently when he stands up or with activity    Tobacco Cessation No Change    Warm-up and Cool-down Performed as group-led instruction    Resistance Training Performed Yes    VAD Patient? No    PAD/SET Patient? No      Pain Assessment   Currently in Pain? No/denies    Pain Score 0-No pain    Multiple Pain Sites No             Capillary Blood Glucose: No results found for this or any previous visit (from the past 24 hour(s)).    Social History   Tobacco Use  Smoking Status Never   Passive exposure: Past  Smokeless Tobacco Never    Goals Met:  Independence with exercise equipment Exercise tolerated well No report of concerns or symptoms today Strength training completed today  Goals Unmet:  Not Applicable  Comments: Check out 915.   Dr. Dina Rich is Medical Director for [facility] Cardiac Rehab

## 2022-07-09 ENCOUNTER — Telehealth: Payer: Self-pay

## 2022-07-09 DIAGNOSIS — R0600 Dyspnea, unspecified: Secondary | ICD-10-CM

## 2022-07-09 DIAGNOSIS — Z6841 Body Mass Index (BMI) 40.0 and over, adult: Secondary | ICD-10-CM

## 2022-07-09 DIAGNOSIS — I10 Essential (primary) hypertension: Secondary | ICD-10-CM

## 2022-07-09 DIAGNOSIS — R400 Somnolence: Secondary | ICD-10-CM

## 2022-07-09 NOTE — Telephone Encounter (Signed)
**Note De-Identified Olon Russ Obfuscation** I started a Split Night sleep study PA through the [facility] provider portal-Pending. Decision ID: [id number]

## 2022-07-16 ENCOUNTER — Ambulatory Visit (HOSPITAL_COMMUNITY)
Admission: RE | Admit: 2022-07-16 | Discharge: 2022-07-16 | Disposition: A | Discharge status: Home / Self Care | Payer: 59 | Source: Ambulatory Visit | Attending: Student | Admitting: Student

## 2022-07-16 DIAGNOSIS — R0609 Other forms of dyspnea: Secondary | ICD-10-CM | POA: Diagnosis present

## 2022-07-16 LAB — PULMONARY FUNCTION TEST
DL/VA % pred: 120 %
DL/VA: 5.13 ml/min/mmHg/L
DLCO unc % pred: 95 %
DLCO unc: 25.82 ml/min/mmHg
FEF 25-75 Post: 5.36 L/sec
FEF 25-75 Pre: 4.77 L/sec
FEF2575-%Change-Post: 12 %
FEF2575-%Pred-Post: 182 %
FEF2575-%Pred-Pre: 162 %
FEV1-%Change-Post: 2 %
FEV1-%Pred-Post: 92 %
FEV1-%Pred-Pre: 89 %
FEV1-Post: 3.25 L
FEV1-Pre: 3.16 L
FEV1FVC-%Change-Post: -1 %
FEV1FVC-%Pred-Pre: 115 %
FEV6-%Change-Post: 4 %
FEV6-%Pred-Post: 85 %
FEV6-%Pred-Pre: 81 %
FEV6-Post: 3.76 L
FEV6-Pre: 3.59 L
FEV6FVC-%Pred-Post: 104 %
FEV6FVC-%Pred-Pre: 104 %
FVC-%Change-Post: 4 %
FVC-%Pred-Post: 81 %
FVC-%Pred-Pre: 77 %
FVC-Post: 3.76 L
FVC-Pre: 3.61 L
Post FEV1/FVC ratio: 87 %
Post FEV6/FVC ratio: 100 %
Pre FEV1/FVC ratio: 88 %
Pre FEV6/FVC Ratio: 100 %
RV % pred: 74 %
RV: 1.62 L
TLC % pred: 81 %
TLC: 5.54 L

## 2022-07-16 MED ORDER — ALBUTEROL SULFATE (2.5 MG/3ML) 0.083% IN NEBU
2.5000 mg | INHALATION_SOLUTION | Freq: Once | RESPIRATORY_TRACT | Status: AC
  Administered 2022-07-16: 2.5 mg via RESPIRATORY_TRACT

## 2022-07-18 NOTE — Progress Notes (Signed)
Discharge Progress Report  Patient Details  Name: Michael Sandoval MRN: [id number] Date of Birth: [birth year] Referring Provider:   Flowsheet Row CARDIAC REHAB PHASE II ORIENTATION from 04/25/2022 in [facility] CARDIAC REHABILITATION  Referring Provider Dr. Lynnette Caffey        Number of Visits: 23  Reason for Discharge:  Patient reached a stable level of exercise. Patient independent in their exercise. Patient has met program and personal goals.  Smoking History:  Social History   Tobacco Use  Smoking Status Never   Passive exposure: Past  Smokeless Tobacco Never    Diagnosis:  NSTEMI (non-ST elevated myocardial infarction) (HCC)  Status post coronary artery stent placement  ADL [facility]:   Initial Exercise Prescription:  Initial Exercise Prescription - 04/25/22 1400       Date of Initial Exercise RX and Referring Provider   Date 04/25/22    Referring Provider Dr. Lynnette Caffey    Expected Discharge Date 07/12/22      Treadmill   MPH 2.4    Grade 0    Minutes 17      Recumbant Elliptical   Level 1    RPM 60    Minutes 22      Prescription Details   Frequency (times per week) 3    Duration Progress to 30 minutes of continuous aerobic without signs/symptoms of physical distress      Intensity   THRR 40-80% of Max Heartrate 65-130    Ratings of Perceived Exertion 11-13    Perceived Dyspnea 0-4      Resistance Training   Training Prescription Yes    Weight 4    Reps 10-15             Discharge Exercise Prescription (Final Exercise Prescription Changes):  Exercise Prescription Changes - 07/01/22 1100       Response to Exercise   Blood Pressure (Admit) 114/72    Blood Pressure (Exercise) 112/70    Blood Pressure (Exit) 122/60    Heart Rate (Admit) 72 bpm    Heart Rate (Exercise) 118 bpm    Heart Rate (Exit) 87 bpm             Functional Capacity:  6 Minute Walk     Row Name 04/25/22 1412 07/05/22 1038       6 Minute Walk   Phase Initial  Discharge    Distance 1525 feet 1800 feet    Distance % Change -- 18 %    Walk Time 6 minutes 6 minutes    # of Rest Breaks 0 0    MPH 2.89 3.4    METS 3.47 4.33    RPE 11 13    VO2 Peak 12.15 15.16    Symptoms No No    Resting HR 70 bpm 72 bpm    Resting BP 112/64 102/74    Resting Oxygen Saturation  97 % 99 %    Exercise Oxygen Saturation  during 6 min walk 99 % 98 %    Max Ex. HR 104 bpm 115 bpm    Max Ex. BP 142/74 160/70    2 Minute Post BP 124/70 128/70             Psychological, QOL, Others - Outcomes: PHQ 2/9:    04/25/2022   12:51 PM  Depression screen PHQ 2/9  Decreased Interest 2  Down, Depressed, Hopeless 0  PHQ - 2 Score 2  Altered sleeping 1  Tired, decreased energy 1  Change in  appetite 1  Feeling bad or failure about yourself  1  Trouble concentrating 3  Moving slowly or fidgety/restless 0  Suicidal thoughts 0  PHQ-9 Score 9  Difficult doing work/chores Somewhat difficult    Quality of Life:  Quality of Life - 04/25/22 1411       Quality of Life   Select Quality of Life      Quality of Life Scores   Health/Function Pre 15.43 %    Socioeconomic Pre 26.56 %    Psych/Spiritual Pre 18.79 %    Family Pre 22.5 %    GLOBAL Pre 19.52 %             Personal Goals: Goals established at orientation with interventions provided to work toward goal.  Personal Goals and Risk Factors at Admission - 04/25/22 1326       Core Components/Risk Factors/Patient Goals on Admission    Weight Management Yes;Weight Loss    Intervention Weight Management: Develop a combined nutrition and exercise program designed to reach desired caloric intake, while maintaining appropriate intake of nutrient and fiber, sodium and fats, and appropriate energy expenditure required for the weight goal.;Weight Management: Provide education and appropriate resources to help participant work on and attain dietary goals.;Weight Management/Obesity: Establish reasonable short term  and long term weight goals.;Obesity: Provide education and appropriate resources to help participant work on and attain dietary goals.    Improve shortness of breath with ADL's Yes    Intervention Provide education, individualized exercise plan and daily activity instruction to help decrease symptoms of SOB with activities of daily living.    Expected Outcomes Short Term: Improve cardiorespiratory fitness to achieve a reduction of symptoms when performing ADLs;Long Term: Be able to perform more ADLs without symptoms or delay the onset of symptoms    Diabetes Yes    Intervention Provide education about signs/symptoms and action to take for hypo/hyperglycemia.;Provide education about proper nutrition, including hydration, and aerobic/resistive exercise prescription along with prescribed medications to achieve blood glucose in normal ranges: Fasting glucose 65-99 mg/dL    Expected Outcomes Short Term: Participant verbalizes understanding of the signs/symptoms and immediate care of hyper/hypoglycemia, proper foot care and importance of medication, aerobic/resistive exercise and nutrition plan for blood glucose control.;Long Term: Attainment of HbA1C < 7%.    Lipids Yes    Intervention Provide education and support for participant on nutrition & aerobic/resistive exercise along with prescribed medications to achieve LDL 70mg , HDL >40mg .    Expected Outcomes Short Term: Participant states understanding of desired cholesterol values and is compliant with medications prescribed. Participant is following exercise prescription and nutrition guidelines.;Long Term: Cholesterol controlled with medications as prescribed, with individualized exercise RX and with personalized nutrition plan. Value goals: LDL < 70mg , HDL > 40 mg.    Stress Yes    Intervention Offer individual and/or small group education and counseling on adjustment to heart disease, stress management and health-related lifestyle change. Teach and  support self-help strategies.;Refer participants experiencing significant psychosocial distress to appropriate mental health specialists for further evaluation and treatment. When possible, include family members and significant others in education/counseling sessions.    Expected Outcomes Short Term: Participant demonstrates changes in health-related behavior, relaxation and other stress management skills, ability to obtain effective social support, and compliance with psychotropic medications if prescribed.;Long Term: Emotional wellbeing is indicated by absence of clinically significant psychosocial distress or social isolation.    Personal Goal Other Yes    Personal Goal improve his overall health  Intervention Pt will attend CR 3 times a week and continue to exercise at home.    Expected Outcomes Pt will attend classes and meet both personal and program goals.              Personal Goals Discharge:  Goals and Risk Factor Review     Row Name 04/29/22 1437 05/29/22 1235 06/26/22 0842 07/18/22 1405       Core Components/Risk Factors/Patient Goals Review   Personal Goals Review Weight Management/Obesity;Improve shortness of breath with ADL's;Diabetes;Lipids;Other;Stress Weight Management/Obesity;Improve shortness of breath with ADL's;Diabetes;Lipids;Other;Stress Weight Management/Obesity;Improve shortness of breath with ADL's;Diabetes;Lipids;Other;Stress Weight Management/Obesity;Improve shortness of breath with ADL's;Diabetes;Lipids;Other;Stress    Review Patient was referred to CR with NSTEMI. He has multiple risk factors for CAD and is participating in the program for risk modification. He plans to start the program in April. His DM is managed with Insulin and mounjaro. His last A1C on file was 09/05/21 at 10.8%. His personal goals for the program are to lose weight and get healthier overall. We will continue to monitor his progress as he works towards meeting these goals. Patient has  completed 7 sessions. His current weight is 254.0 lbs down 2 lbs from his orientation weight. He is doing well in the program with consistent attendance and progressions. His blood pressure is at goal. His DM continues to be managed with Insulin and mounjaro. His last A1C on file was 09/05/21 at 10.8%. His personal goals for the program continue to be to lose weight and get healthier overall. We will continue to monitor his progress as he works towards meeting these goals. Patient has completed 19 sessions. His current weight is 256.0 lbs up 2 lbs since last 30 review. He continues to do well in the program with consistent attendance and progressions. His blood pressure continues to be at goal. His DM continues to be managed with Insulin and mounjaro. His last A1C on file was 09/05/21 at 10.8%. He came to CR 5/1 complaining feeling tired, dizzy, and nauseous. We did not allow him to exercise and advised he see his pcp. He says these symptoms are chronic for him. He did call his cardiologty Dr. Lynnette Caffey and he advised him to go to ED. He did go to ED. Diagnosed with vertigo. No changes made. He followed up with his PCP 5/4. No changes made. He His personal goals for the program continue to be to lose weight and get healthier overall. We will continue to monitor his progress as he works towards meeting these goals. Patient completed the program with 23 sessions. His discharge weight was 244 lbs losing 10 lbs overall. His exit walk test improved by 18%. His blood pressure was at goal. He did very well in the program. He did not complete discharge documentation so all program outcome goals could not be measured. He did meet the outcome goals we were able to measure. He says he feels like he did meet his personal goals. He plans to continue exercising at a local gym.    Expected Outcomes Patient will complete the program meeting both personal and program goals. Patient will complete the program meeting both personal and  program goals. Patient will complete the program meeting both personal and program goals. Patient will continue to exercise at a local gym and continue to meet his personal goals.             Exercise Goals and Review:  Exercise Goals     Row Name 04/25/22 1414 05/06/22  1112 06/03/22 1315         Exercise Goals   Increase Physical Activity Yes Yes Yes     Intervention Provide advice, education, support and counseling about physical activity/exercise needs.;Develop an individualized exercise prescription for aerobic and resistive training based on initial evaluation findings, risk stratification, comorbidities and participant's personal goals. Provide advice, education, support and counseling about physical activity/exercise needs.;Develop an individualized exercise prescription for aerobic and resistive training based on initial evaluation findings, risk stratification, comorbidities and participant's personal goals. Provide advice, education, support and counseling about physical activity/exercise needs.;Develop an individualized exercise prescription for aerobic and resistive training based on initial evaluation findings, risk stratification, comorbidities and participant's personal goals.     Expected Outcomes Short Term: Attend rehab on a regular basis to increase amount of physical activity.;Long Term: Add in home exercise to make exercise part of routine and to increase amount of physical activity.;Long Term: Exercising regularly at least 3-5 days a week. Short Term: Attend rehab on a regular basis to increase amount of physical activity.;Long Term: Add in home exercise to make exercise part of routine and to increase amount of physical activity.;Long Term: Exercising regularly at least 3-5 days a week. Short Term: Attend rehab on a regular basis to increase amount of physical activity.;Long Term: Add in home exercise to make exercise part of routine and to increase amount of physical  activity.;Long Term: Exercising regularly at least 3-5 days a week.     Increase Strength and Stamina Yes Yes Yes     Intervention Provide advice, education, support and counseling about physical activity/exercise needs.;Develop an individualized exercise prescription for aerobic and resistive training based on initial evaluation findings, risk stratification, comorbidities and participant's personal goals. Provide advice, education, support and counseling about physical activity/exercise needs.;Develop an individualized exercise prescription for aerobic and resistive training based on initial evaluation findings, risk stratification, comorbidities and participant's personal goals. Provide advice, education, support and counseling about physical activity/exercise needs.;Develop an individualized exercise prescription for aerobic and resistive training based on initial evaluation findings, risk stratification, comorbidities and participant's personal goals.     Expected Outcomes Short Term: Increase workloads from initial exercise prescription for resistance, speed, and METs.;Short Term: Perform resistance training exercises routinely during rehab and add in resistance training at home;Long Term: Improve cardiorespiratory fitness, muscular endurance and strength as measured by increased METs and functional capacity ( ) Short Term: Increase workloads from initial exercise prescription for resistance, speed, and METs.;Short Term: Perform resistance training exercises routinely during rehab and add in resistance training at home;Long Term: Improve cardiorespiratory fitness, muscular endurance and strength as measured by increased METs and functional capacity ( ) Short Term: Increase workloads from initial exercise prescription for resistance, speed, and METs.;Short Term: Perform resistance training exercises routinely during rehab and add in resistance training at home;Long Term: Improve cardiorespiratory  fitness, muscular endurance and strength as measured by increased METs and functional capacity ( )     Able to understand and use rate of perceived exertion (RPE) scale Yes Yes Yes     Intervention Provide education and explanation on how to use RPE scale Provide education and explanation on how to use RPE scale Provide education and explanation on how to use RPE scale     Expected Outcomes Short Term: Able to use RPE daily in rehab to express subjective intensity level;Long Term:  Able to use RPE to guide intensity level when exercising independently Short Term: Able to use RPE daily in rehab to express subjective intensity level;Long  Term:  Able to use RPE to guide intensity level when exercising independently Short Term: Able to use RPE daily in rehab to express subjective intensity level;Long Term:  Able to use RPE to guide intensity level when exercising independently     Knowledge and understanding of Target Heart Rate Range (THRR) Yes Yes Yes     Intervention Provide education and explanation of THRR including how the numbers were predicted and where they are located for reference Provide education and explanation of THRR including how the numbers were predicted and where they are located for reference Provide education and explanation of THRR including how the numbers were predicted and where they are located for reference     Expected Outcomes Short Term: Able to state/look up THRR;Long Term: Able to use THRR to govern intensity when exercising independently;Short Term: Able to use daily as guideline for intensity in rehab Short Term: Able to state/look up THRR;Long Term: Able to use THRR to govern intensity when exercising independently;Short Term: Able to use daily as guideline for intensity in rehab Short Term: Able to state/look up THRR;Long Term: Able to use THRR to govern intensity when exercising independently;Short Term: Able to use daily as guideline for intensity in rehab     Able to  check pulse independently Yes Yes Yes     Intervention Provide education and demonstration on how to check pulse in carotid and radial arteries.;Review the importance of being able to check your own pulse for safety during independent exercise Provide education and demonstration on how to check pulse in carotid and radial arteries.;Review the importance of being able to check your own pulse for safety during independent exercise Provide education and demonstration on how to check pulse in carotid and radial arteries.;Review the importance of being able to check your own pulse for safety during independent exercise     Expected Outcomes Long Term: Able to check pulse independently and accurately;Short Term: Able to explain why pulse checking is important during independent exercise Long Term: Able to check pulse independently and accurately;Short Term: Able to explain why pulse checking is important during independent exercise Long Term: Able to check pulse independently and accurately;Short Term: Able to explain why pulse checking is important during independent exercise     Understanding of Exercise Prescription Yes Yes Yes     Intervention Provide education, explanation, and written materials on patient's individual exercise prescription Provide education, explanation, and written materials on patient's individual exercise prescription Provide education, explanation, and written materials on patient's individual exercise prescription     Expected Outcomes Short Term: Able to explain program exercise prescription;Long Term: Able to explain home exercise prescription to exercise independently Short Term: Able to explain program exercise prescription;Long Term: Able to explain home exercise prescription to exercise independently Short Term: Able to explain program exercise prescription;Long Term: Able to explain home exercise prescription to exercise independently              Exercise Goals  Re-Evaluation:  Exercise Goals Re-Evaluation     Row Name 05/06/22 1113 06/03/22 1315           Exercise Goal Re-Evaluation   Exercise Goals Review Increase Physical Activity;Increase Strength and Stamina;Able to understand and use rate of perceived exertion (RPE) scale;Knowledge and understanding of Target Heart Rate Range (THRR);Able to check pulse independently;Understanding of Exercise Prescription Increase Physical Activity;Increase Strength and Stamina;Able to understand and use rate of perceived exertion (RPE) scale;Knowledge and understanding of Target Heart Rate Range (THRR);Able to check  pulse independently;Understanding of Exercise Prescription      Comments Pt completed orientation on 04/25/2022, and he will begin the program on 05/13/2022. Unable to assess progress at this time. Pt has completed 10 sessions pf CR. He is very motivated and egar to increase his workloads. He is currently exercising at 3.5 METs on the treadmill. Will contiunue to monitor and progress as able.      Expected Outcomes Through exercise at rehab and at home, the patient will meet their stated goals. Through exercise at rehab and at home, the patient will meet their stated goals.               Nutrition & Weight - Outcomes:  Pre Biometrics - 04/25/22 1415       Pre Biometrics   Height 5\' 9"  (1.753 m)    Weight 116.2 kg    Waist Circumference 49 inches    Hip Circumference 46 inches    Waist to Hip Ratio 1.07 %    BMI (Calculated) 37.81    Triceps Skinfold 34 mm    % Body Fat 38.6 %    Grip Strength 46.6 kg    Flexibility 0 in    Single Leg Stand 15 seconds             Post Biometrics - 07/05/22 1039        Post  Biometrics   Height 5\' 9"  (1.753 m)    Weight 110.6 kg    Waist Circumference 46 inches    Hip Circumference 45 inches    Waist to Hip Ratio 1.02 %    BMI (Calculated) 35.99    Triceps Skinfold 34 mm    % Body Fat 36.4 %    Grip Strength 43.6 kg    Flexibility 0 in     Single Leg Stand 12.36 seconds             Nutrition:  Nutrition Therapy & Goals - 07/18/22 1404       Nutrition Therapy   RD appointment deferred Yes      Personal Nutrition Goals   Comments Patient did not completed discharge documentation.             Nutrition Discharge:  Nutrition Assessments - 04/25/22 1311       MEDFICTS Scores   Pre Score 104             Education Questionnaire Score:  Knowledge Questionnaire Score - 04/25/22 1316       Knowledge Questionnaire Score   Pre Score 21/24           Patient completed the program with 23 sessions. His discharge weight was 244 lbs losing 10 lbs overall. His exit walk test improved by 18%. His blood pressure was at goal. He did very well in the program. He did not complete discharge documentation so all program outcome goals could not be measured. He did meet the outcome goals we were able to measure. He says he feels like he did meet his personal goals. He plans to continue exercising at a local gym.   Goals reviewed with patient; copy given to patient.

## 2022-07-25 NOTE — Telephone Encounter (Signed)
**Note De-Identified Olivene Cookston Obfuscation** Decision is still "Pending" in [facility] Portal so I called [employer] and s/w Joni Reining who advised me that this Split Night PA has been denied due to "Lack of medical necessity".  I used DX and ICD Codes: Somnolence (R40.0)                                            Dyspnea (R06.00)                                            Obesity (E66.01)  Forwarding this decision to Dr Lynnette Caffey and his nurse.

## 2022-07-27 ENCOUNTER — Encounter: Payer: Self-pay | Admitting: Internal Medicine

## 2022-07-29 NOTE — Telephone Encounter (Signed)
Changed split night study to home sleep test per Dr. Lynnette Caffey.

## 2022-07-29 NOTE — Telephone Encounter (Signed)
**Note De-Identified Kynli Chou Obfuscation** Notification or Prior Authorization is not required for the requested services.  Decision ID #: [id number]  The number above acknowledges your inquiry and our response. Please write this number down and refer to it for future inquiries. Coverage and payment for an item or service is governed by the member's benefit plan document, and, if applicable, the provider's participation agreement with the Health Plan.

## 2022-08-08 ENCOUNTER — Telehealth: Payer: Self-pay | Admitting: *Deleted

## 2022-08-08 NOTE — Telephone Encounter (Signed)
   Pre-operative Risk Assessment    Patient Name: Michael Sandoval  DOB: 11-04-1965 MRN: [id number]    PT HAS APPT 09/17/22 WITH DR. Lynnette Caffey, WILL ADD NEED PRE OP CLEARANCE TO APPT NOTES   Request for Surgical Clearance    Procedure:   COLONOSCOPY  Date of Surgery:  Clearance 10/07/22                                 Surgeon:  DR. Ewing Schlein Surgeon's Group or Practice Name:  [facility] Phone number:  [phone] Fax number:  [phone]   Type of Clearance Requested:   - Medical  - Pharmacy:  Hold Clopidogrel (Plavix)     Type of Anesthesia:   PROPOFOL   Additional requests/questions:    Elpidio Anis   08/08/2022, 5:56 PM

## 2022-08-12 ENCOUNTER — Encounter: Payer: Self-pay | Admitting: Internal Medicine

## 2022-08-29 ENCOUNTER — Ambulatory Visit: Payer: 59 | Admitting: Nurse Practitioner

## 2022-08-30 ENCOUNTER — Telehealth: Payer: 59 | Admitting: Nurse Practitioner

## 2022-08-30 NOTE — Telephone Encounter (Signed)
Pt aware sleep study cx Per Dr Gaylan Gerold

## 2022-09-12 NOTE — Progress Notes (Signed)
Cardiology Office Note:  .   Date:  09/17/2022  ID:  [name], DOB October 03, 1965, MRN [id number] PCP: Farris Has, MD   [facility] Providers Cardiologist:  Orbie Pyo, MD    Patient Profile: .      PMH CAD LHC 09/11/21 PCI/DES x 1 to proximal-mid LAD Marginal lesion 20% Second marginal lesion 85% TTE 09/12/21 with LVEF 55-65%, G1DD, no significant valvular abnormalities OSA on CPAP Hyperlipidemia Hypertension Type 2 DM Morbid obesity GERD  Seen by Dr. Lynnette Caffey on 08/20/2021 for evaluation of chest pain.  EKG was unremarkable.  History of elevated coronary calcium on CT in 2017.  He reported chest discomfort once every few weeks.  Shortness of breath with exertion as well.  Has been intolerant of statins. He underwent diagnostic catheterization at [facility] on September 04, 2021.  CT images were reviewed and revealed a long 85 to 90% LAD stenosis in proximal to mid LAD but not including diagonal vessel, minimal narrowing in high marginal vessel with focal 85% stenosis in very small 2nd marginal branch of Cx.  He had normal dominant RCA.  Subsequent surgical consultation with Dr. Maren Beach who felt patient best treated with PCI.  Was advised to undergo cardiopulmonary exercise test.  Report from Dr. Gala Romney included overall suspicion that body habitus is major limiting factor in his perceived exercise intolerance.   Seen in clinic 09/19/21 for BP of 85/52 with associated dizziness at endocrinology office.  He had not been monitoring BP consistently at home.  He had been more physically active, losing weight, and was on weight loss medications. BP at our office was 100/76.  He was advised to hold metoprolol for SBP < 100.  Last cardiology clinic visit was 04/24/2022 with Edd Fabian, NP. BP was stable at 122/72. His dual antiplatelet therapy was planned to be continued through 08/2022 at which time he could discontinue aspirin and continue Plavix monotherapy. He had no specific  cardiac symptoms. F/u 4-6 months.      History of Present Illness: Michael Sandoval   ZAKARIYE Sandoval is a very pleasant 57 y.o. male who is here today for preoperative cardiac evaluation for colonoscopy scheduled 10/07/22. Reports occasional chest tightness that he feels is secondary to anxiety/stress. Noticed less discomfort during time off from work  He is exercising on a consistent basis with 30 min elliptical daily. No chest pain like prior to stent. Breathing is better since he has increased exercise and is losing weight. Average home BP 121/78 mmHg. He gets lightheaded on occasion, no presyncope or syncope.  No edema, orthopnea, PND, palpitations. He would like to decrease pill burden. Lengthy discussion about medications.  He completed cardiac rehab in May.   ROS: See HPI       Studies Reviewed: Michael Sandoval   EKG Interpretation Date/Time:  Tuesday September 17 2022 11:06:59 EDT Ventricular Rate:  68 PR Interval:  186 QRS Duration:  74 QT Interval:  416 QTC Calculation: 442 R Axis:   68  Text Interpretation: Normal sinus rhythm with sinus arrhythmia Normal ECG When compared with ECG of 12-Jun-2022 10:58, Minimal criteria for Anterior infarct are no longer Present Nonspecific T wave abnormality Confirmed by Eligha Bridegroom [phone]) on 09/17/2022 11:13:39 AM     Risk Assessment/Calculations:             Physical Exam:   VS:  BP 120/80   Pulse 68   Ht 5\' 9"  (1.753 m)   Wt 236 lb 3.2 oz (107.1 kg)  SpO2 96%   BMI 34.88 kg/m    Wt Readings from Last 3 Encounters:  09/17/22 236 lb 3.2 oz (107.1 kg)  07/05/22 243 lb 13.3 oz (110.6 kg)  07/04/22 246 lb (111.6 kg)    GEN: Well nourished, well developed in no acute distress NECK: No JVD; No carotid bruits CARDIAC: RRR, no murmurs, rubs, gallops RESPIRATORY:  Clear to auscultation without rales, wheezing or rhonchi  ABDOMEN: Soft, non-tender, non-distended EXTREMITIES:  No edema; No deformity     ASSESSMENT AND PLAN: .    Preoperative cardiac  evaluation: According to the Revised Cardiac Risk Index (RCRI), his Perioperative Risk of Major Cardiac Event is (%): 6.6 based on history of ischemic heart disease and current use of insulin. His Functional Capacity in METs is: 7.59 according to the Duke Activity Status Index (DASI). The patient is doing well from a cardiac perspective. Therefore, based on ACC/AHA guidelines, the patient would be at acceptable risk for the planned procedure without further cardiovascular testing.  He may hold Plavix for 5 days prior to procedure and should resume as soon as hemodynamically stable.  I will forward clearance to requesting provider.  CAD without angina: S/p PCI/DES to prox-mid LAD with mild stenosis in 1st marginal and 95% stenosis in 2nd marginal. He denies chest pain, dyspnea, or other symptoms concerning for angina.  No indication for further ischemic evaluation at this time.  He is exercising without any concerning cardiac symptoms.  No bleeding concerns. Advised he may d/c aspirin as recommended by Dr. Lynnette Caffey, primary cardiologist, and continue clopidogrel monotherapy. Cholesterol is well controlled. Continue metoprolol, ezetimibe, and rosuvastatin.  Hypertension: BP is well controlled. No episodes of hypotension. Renal function stable on labs completed 06/2022.  Continue metoprolol.  Hyperlipidemia LDL goal < 70: LDL 26 on 610/24.  Continue rosuvastatin and ezetimibe.  Type 2 DM: A1c 7.4 on 03/22/2022.  He recently started semaglutide for weight loss. He continues to work on weight loss, regular exercise, and healthy diet. Management per PCP.   Medication management: He would like to decrease his pill burden. We had a lengthy discussion about current medications.  We will go ahead and discontinue aspirin as it has been 1 year post stent.  He will continue clopidogrel monotherapy. Advised he could discontinue ezetimibe due to LDL well below goal.  He elected to continue all current medications with the  exception of aspirin.       Dispo: 6 months with Dr. Lynnette Caffey  Signed, Eligha Bridegroom, NP-C

## 2022-09-17 ENCOUNTER — Encounter: Payer: Self-pay | Admitting: Nurse Practitioner

## 2022-09-17 ENCOUNTER — Ambulatory Visit: Payer: 59 | Admitting: Internal Medicine

## 2022-09-17 ENCOUNTER — Ambulatory Visit: Payer: 59 | Attending: Internal Medicine | Admitting: Nurse Practitioner

## 2022-09-17 VITALS — BP 120/80 | HR 68 | Ht 69.0 in | Wt 236.2 lb

## 2022-09-17 DIAGNOSIS — E118 Type 2 diabetes mellitus with unspecified complications: Secondary | ICD-10-CM

## 2022-09-17 DIAGNOSIS — I10 Essential (primary) hypertension: Secondary | ICD-10-CM | POA: Diagnosis not present

## 2022-09-17 DIAGNOSIS — I2510 Atherosclerotic heart disease of native coronary artery without angina pectoris: Secondary | ICD-10-CM | POA: Diagnosis not present

## 2022-09-17 DIAGNOSIS — E785 Hyperlipidemia, unspecified: Secondary | ICD-10-CM | POA: Diagnosis not present

## 2022-09-17 DIAGNOSIS — Z794 Long term (current) use of insulin: Secondary | ICD-10-CM

## 2022-09-17 DIAGNOSIS — Z79899 Other long term (current) drug therapy: Secondary | ICD-10-CM

## 2022-09-17 DIAGNOSIS — Z01810 Encounter for preprocedural cardiovascular examination: Secondary | ICD-10-CM

## 2022-09-17 NOTE — Patient Instructions (Signed)
Medication Instructions:   DISCONTINUE Asprin  *If you need a refill on your cardiac medications before your next appointment, please call your pharmacy*   Lab Work:  None ordered.  If you have labs (blood work) drawn today and your tests are completely normal, you will receive your results only by: MyChart Message (if you have MyChart) OR A paper copy in the mail If you have any lab test that is abnormal or we need to change your treatment, we will call you to review the results.   Testing/Procedures:  None ordered.   Follow-Up: At [facility], you and your health needs are our priority.  As part of our continuing mission to provide you with exceptional heart care, we have created designated Provider Care Teams.  These Care Teams include your primary Cardiologist (physician) and Advanced Practice Providers (APPs -  Physician Assistants and Nurse Practitioners) who all work together to provide you with the care you need, when you need it.  We recommend signing up for the patient portal called "MyChart".  Sign up information is provided on this After Visit Summary.  MyChart is used to connect with patients for Virtual Visits (Telemedicine).  Patients are able to view lab/test results, encounter notes, upcoming appointments, etc.  Non-urgent messages can be sent to your provider as well.   To learn more about what you can do with MyChart, go to [email or web address].    Your next appointment:   6 month(s)  Provider:   Orbie Pyo, MD     Other Instructions  Your physician wants you to follow-up in: 6 months with Dr. Lynnette Caffey.  You will receive a reminder letter in the mail two months in advance. If you don't receive a letter, please call our office to schedule the follow-up appointment.

## 2022-11-15 ENCOUNTER — Other Ambulatory Visit (HOSPITAL_COMMUNITY): Payer: Self-pay | Admitting: Family Medicine

## 2022-11-15 ENCOUNTER — Ambulatory Visit (HOSPITAL_COMMUNITY)
Admission: RE | Admit: 2022-11-15 | Discharge: 2022-11-15 | Disposition: A | Discharge status: Home / Self Care | Payer: 59 | Source: Ambulatory Visit | Attending: Family Medicine | Admitting: Family Medicine

## 2022-11-15 DIAGNOSIS — R109 Unspecified abdominal pain: Secondary | ICD-10-CM

## 2023-02-24 ENCOUNTER — Other Ambulatory Visit: Payer: Self-pay | Admitting: Internal Medicine

## 2023-03-22 ENCOUNTER — Other Ambulatory Visit: Payer: Self-pay | Admitting: Internal Medicine

## 2023-08-03 IMAGING — MR MR MRA HEAD W/O CM
1 of 2 series · 10 of 48 positions shown · non-contrast
Comparison: None Available.

CLINICAL DATA: Dizziness.

EXAM:
MRI HEAD WITHOUT CONTRAST
MRA HEAD WITHOUT CONTRAST
TECHNIQUE: Multiplanar, multi-echo pulse sequences of the brain and surrounding
structures were acquired without intravenous contrast. Angiographic
images of the Circle of Willis were acquired using MRA technique
without intravenous contrast.

[Series 5: [id number] · axial · 0.6mm · 0.26mm/px · z∈[-89,-11]mm · 10 of 149 slices shown]
[im 8/149]
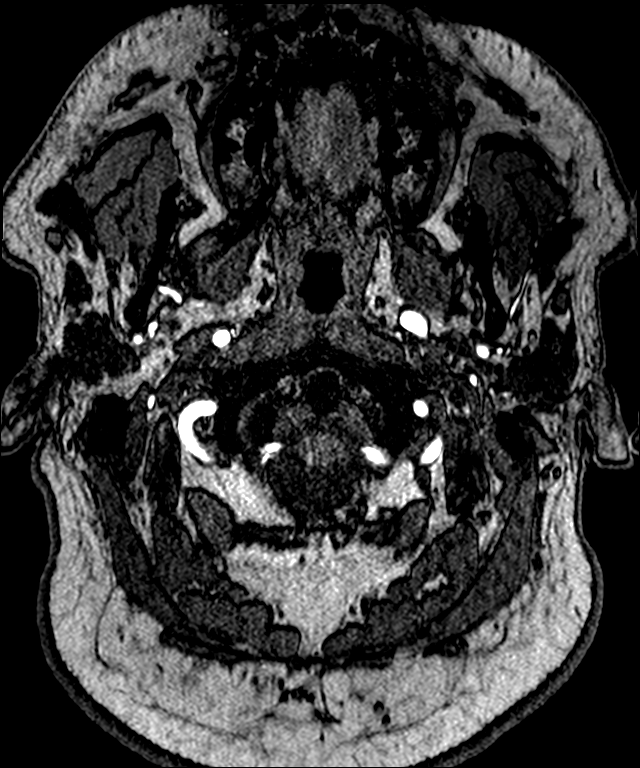
[im 26/149]
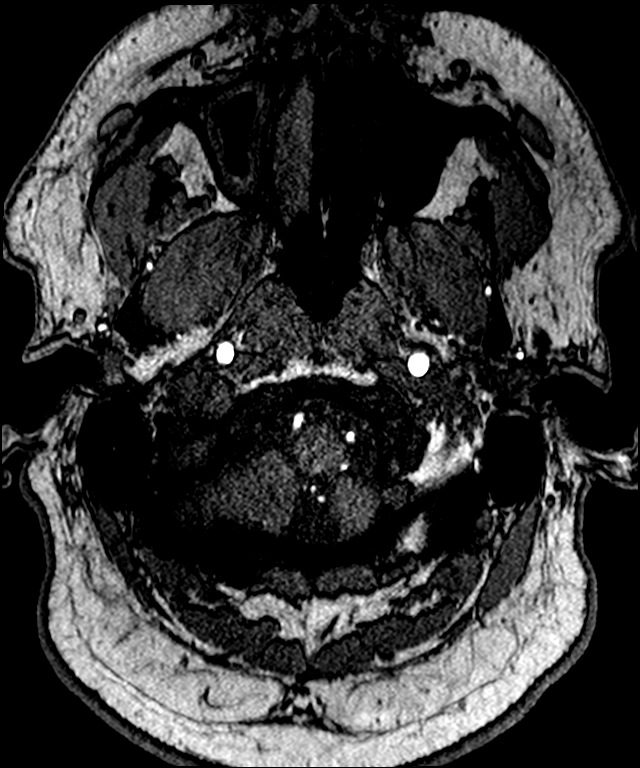
[im 47/149]
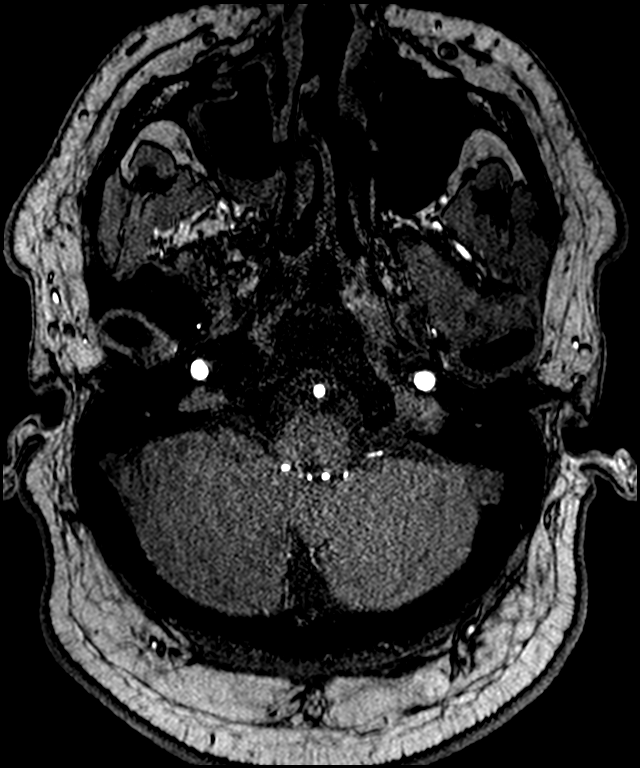
[im 65/149]
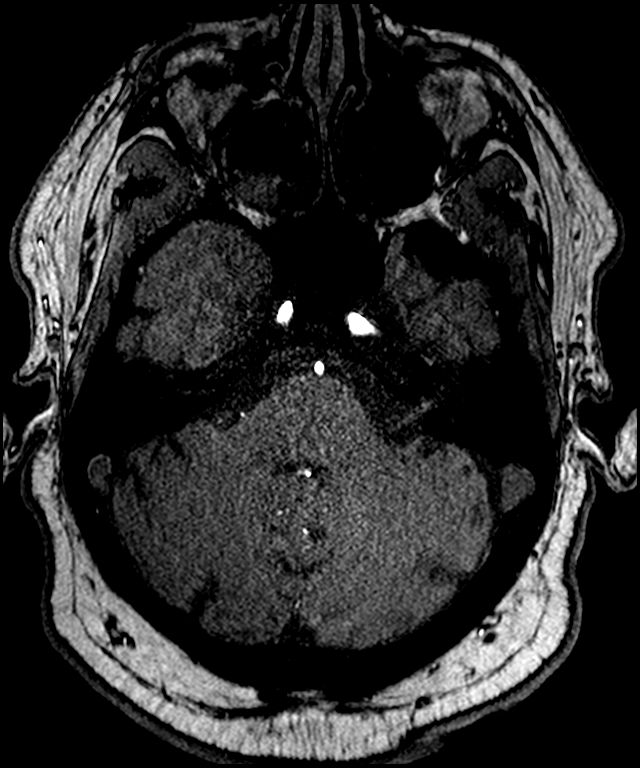
[im 76/149]
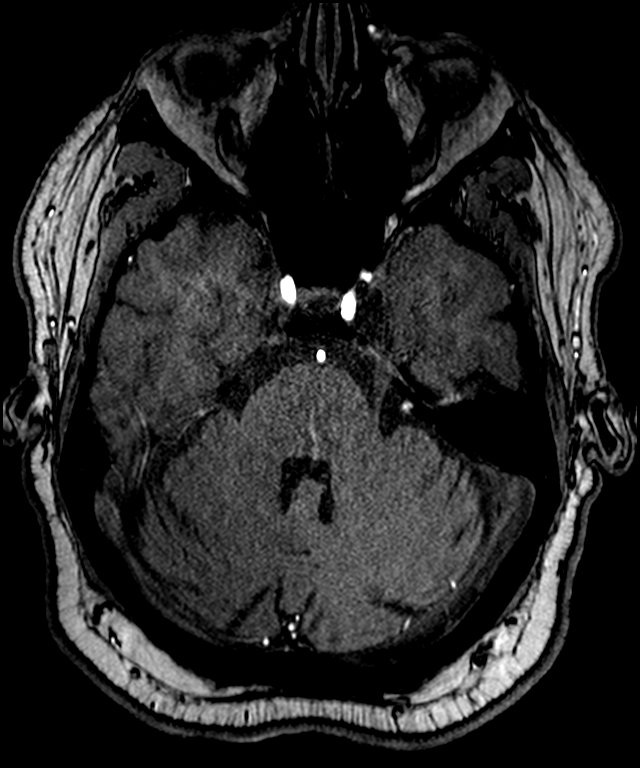
[im 84/149]
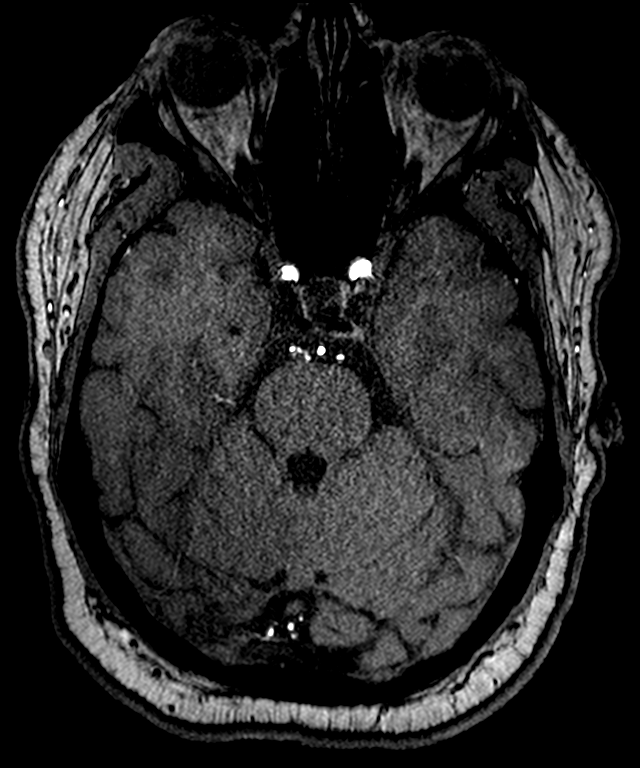
[im 102/149]
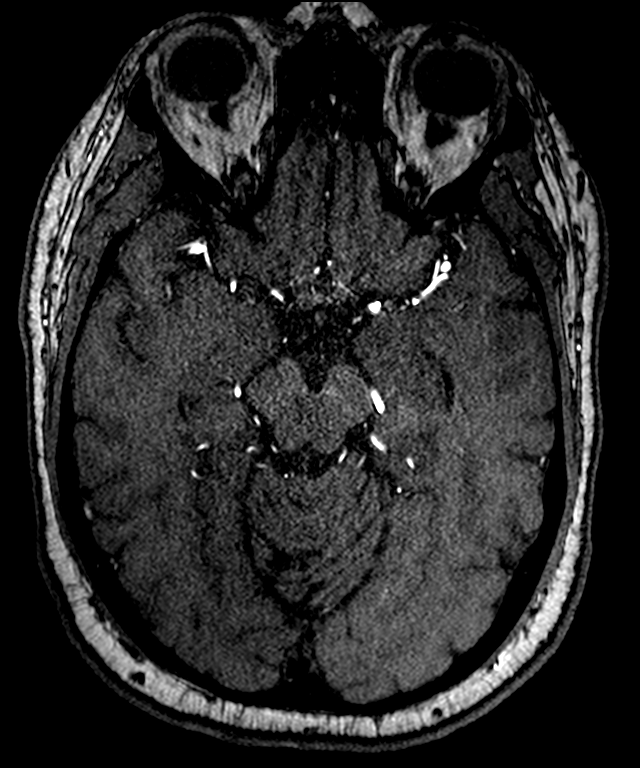
[im 123/149]
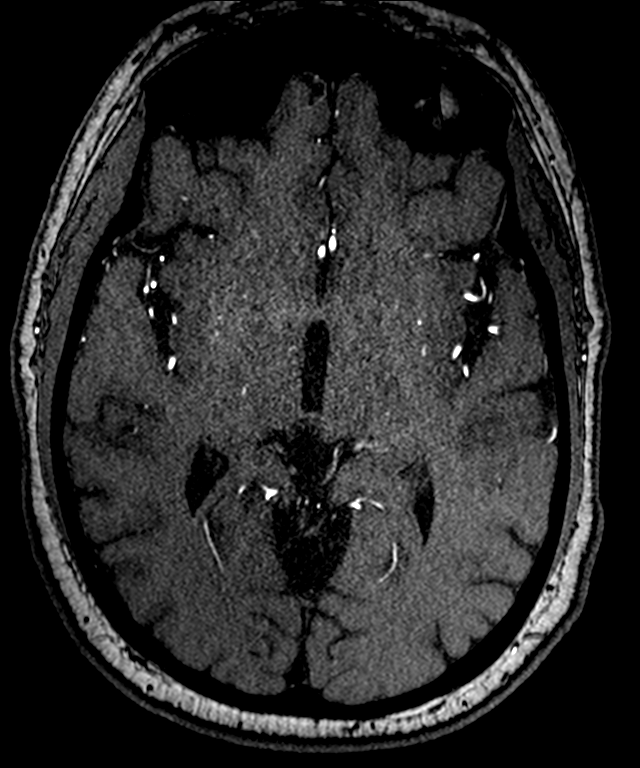
[im 127/149]
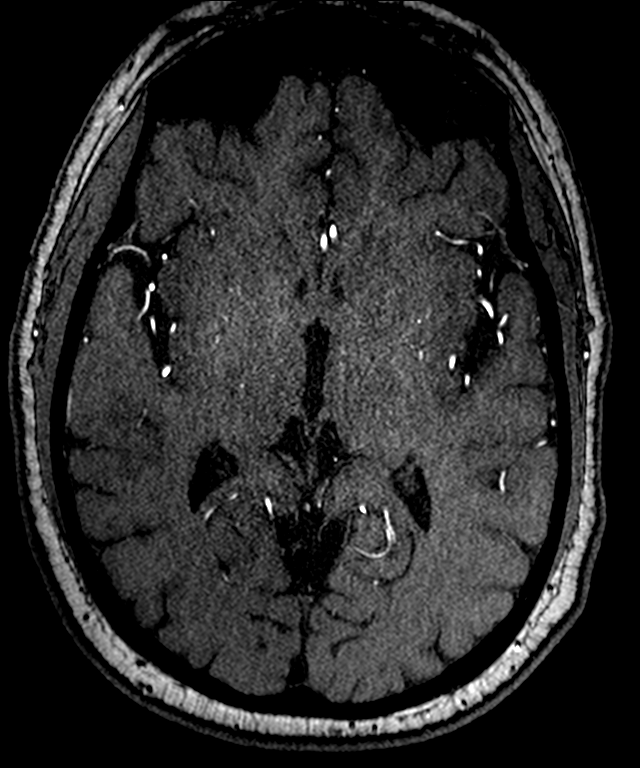
[im 141/149]
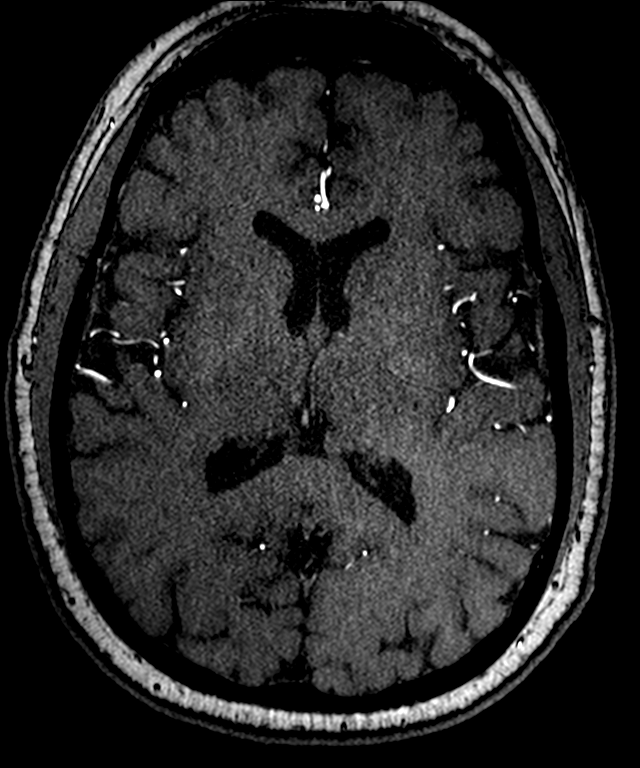

[10 of 48 positions shown; findings below may reference images not displayed]

FINDINGS: MRI HEAD FINDINGS

Brain: No acute infarction, hemorrhage, hydrocephalus, extra-axial
collection or mass lesion. Rare foci of T2 hyperintensity are seen
within the white matter of the cerebral hemispheres, nonspecific,
most likely related to chronic small vessel ischemia. Mild
parenchymal volume loss.

An empty sella is noted with associated prominence of the bilateral
Meckel's caves as well as CSF prominence along the cavernous
sinuses, likely along the third cranial nerves (series 16, image [DATE] be incidental. However, correlation for increased intracranial
pressure is suggested.

Vascular: Normal flow voids.

Skull and upper cervical spine: No focal marrow lesion.

Sinuses/Orbits: Mild mucosal thickening throughout the paranasal
sinuses with fluid level in the right maxillary sinus.

Other: None.

MRA HEAD FINDINGS

Anterior circulation: The visualized portions of the distal cervical
and intracranial internal carotid arteries are widely patent with
normal flow related enhancement. The bilateral anterior cerebral
arteries and middle cerebral arteries are widely patent with
anterograde flow without high-grade flow-limiting stenosis or
proximal branch occlusion. No intracranial aneurysm within the
anterior circulation.

Posterior circulation: The vertebral arteries are widely patent with
anterograde flow. Vertebrobasilar junction and basilar artery are
widely patent with anterograde flow without evidence of basilar
stenosis or aneurysm. Posterior cerebral arteries are normal
bilaterally, noting that right posterior cerebral artery origin is
directly from the right ICA (fetal PCA). No intracranial aneurysm
within the posterior circulation.

Anatomic variants: Hypoplastic right A1/ACA.  Right fetal PCA.
IMPRESSION: 1. Empty sella with associated prominence of the bilateral Meckel's
caves and CSF prominence in the cavernous sinuses, likely along the
third cranial nerves may be incidental. However, correlation for
increased intracranial pressure is suggested.
2. Minimal amount of nonspecific T2 hyperintense lesions of the
white matter, may represent early chronic microangiopathy.
3. Unremarkable MR angiogram of the brain.

## 2023-11-03 ENCOUNTER — Encounter (HOSPITAL_COMMUNITY): Payer: Self-pay

## 2023-11-03 ENCOUNTER — Emergency Department (HOSPITAL_COMMUNITY)

## 2023-11-03 ENCOUNTER — Observation Stay (HOSPITAL_COMMUNITY)
Admission: EM | Admit: 2023-11-03 | Discharge: 2023-11-04 | Disposition: A | Discharge status: Home / Self Care | Attending: Emergency Medicine | Admitting: Emergency Medicine

## 2023-11-03 ENCOUNTER — Other Ambulatory Visit: Payer: Self-pay

## 2023-11-03 ENCOUNTER — Encounter: Payer: Self-pay | Admitting: Internal Medicine

## 2023-11-03 DIAGNOSIS — K219 Gastro-esophageal reflux disease without esophagitis: Secondary | ICD-10-CM | POA: Insufficient documentation

## 2023-11-03 DIAGNOSIS — R079 Chest pain, unspecified: Secondary | ICD-10-CM | POA: Diagnosis not present

## 2023-11-03 DIAGNOSIS — I2510 Atherosclerotic heart disease of native coronary artery without angina pectoris: Principal | ICD-10-CM | POA: Insufficient documentation

## 2023-11-03 DIAGNOSIS — I10 Essential (primary) hypertension: Secondary | ICD-10-CM | POA: Diagnosis not present

## 2023-11-03 DIAGNOSIS — Z79899 Other long term (current) drug therapy: Secondary | ICD-10-CM | POA: Insufficient documentation

## 2023-11-03 DIAGNOSIS — E119 Type 2 diabetes mellitus without complications: Secondary | ICD-10-CM | POA: Insufficient documentation

## 2023-11-03 DIAGNOSIS — E11621 Type 2 diabetes mellitus with foot ulcer: Secondary | ICD-10-CM | POA: Diagnosis not present

## 2023-11-03 DIAGNOSIS — R0602 Shortness of breath: Secondary | ICD-10-CM

## 2023-11-03 DIAGNOSIS — Z7982 Long term (current) use of aspirin: Secondary | ICD-10-CM | POA: Insufficient documentation

## 2023-11-03 DIAGNOSIS — E785 Hyperlipidemia, unspecified: Secondary | ICD-10-CM | POA: Diagnosis present

## 2023-11-03 DIAGNOSIS — Z794 Long term (current) use of insulin: Secondary | ICD-10-CM | POA: Diagnosis not present

## 2023-11-03 DIAGNOSIS — I25709 Atherosclerosis of coronary artery bypass graft(s), unspecified, with unspecified angina pectoris: Secondary | ICD-10-CM

## 2023-11-03 DIAGNOSIS — R0789 Other chest pain: Principal | ICD-10-CM

## 2023-11-03 LAB — BASIC METABOLIC PANEL WITH GFR
Anion gap: 9 (ref 5–15)
BUN: 12 mg/dL (ref 6–20)
CO2: 25 mmol/L (ref 22–32)
Calcium: 8.6 mg/dL — ABNORMAL LOW (ref 8.9–10.3)
Chloride: 101 mmol/L (ref 98–111)
Creatinine, Ser: 1.01 mg/dL (ref 0.61–1.24)
GFR, Estimated: >60 mL/min (ref >60)
Glucose, Bld: 302 mg/dL — ABNORMAL HIGH (ref 70–99)
Potassium: 4.8 mmol/L (ref 3.5–5.1)
Sodium: 135 mmol/L (ref 135–145)

## 2023-11-03 LAB — CBC
HCT: 44.2 % (ref 39.0–52.0)
Hemoglobin: 14.7 g/dL (ref 13.0–17.0)
MCH: 30.3 pg (ref 26.0–34.0)
MCHC: 33.3 g/dL (ref 30.0–36.0)
MCV: 91.1 fL (ref 80.0–100.0)
Platelets: 272 K/uL (ref 150–400)
RBC: 4.85 MIL/uL (ref 4.22–5.81)
RDW: 12.8 % (ref 11.5–15.5)
WBC: 11.6 K/uL — ABNORMAL HIGH (ref 4.0–10.5)
nRBC: 0 % (ref 0.0–0.2)

## 2023-11-03 LAB — TROPONIN I (HIGH SENSITIVITY)
Troponin I (High Sensitivity): 4 ng/L (ref <18)
Troponin I (High Sensitivity): 4 ng/L (ref <18)

## 2023-11-03 LAB — D-DIMER, QUANTITATIVE: D-Dimer, Quant: 0.33 ug{FEU}/mL (ref 0.00–0.50)

## 2023-11-03 NOTE — ED Provider Notes (Signed)
 [place] EMERGENCY DEPARTMENT AT [facility] Provider Note   CSN: [id number] Arrival date & time: 11/03/23  1318     Patient presents with: Shortness of Breath and Fatigue   Michael Sandoval is a 58 y.o. male.    Shortness of Breath  Patient is a 58 year old male with PMH of CAD s/p PCI with DES to proximal/mid LAD (08/23), HTN, HLD, and T2DM without insulin  dependence, presenting to the ED via POV with a chief complaint of x 4 days of shortness of breath and chest  pressure/tightness, worse with exertion and conversation.  Patient further endorses generalized fatigue/weakness without focality.  He denies recent headache, vision changes, palpitations, back pain, fevers, chills, nausea, vomiting, cough, congestion, abdominal pain, changes in urination, and changes in bowel movements.  No syncope.  Prior to Admission medications   Medication Sig Start Date End Date Taking? Authorizing Provider  Accu-Chek FastClix Lancets MISC  06/18/22   [provider]  cetirizine (ZYRTEC) 10 MG tablet Take 10 mg by mouth as needed for allergies.    [provider]  clopidogrel  (PLAVIX ) 75 MG tablet TAKE 1 TABLET DAILY 02/25/23   Thukkani, Arun K, MD  ezetimibe  (ZETIA ) 10 MG tablet TAKE 1 TABLET DAILY 03/24/23   Thukkani, Arun K, MD  fluticasone  (FLONASE ) 50 MCG/ACT nasal spray Place 1 spray into both nostrils daily as needed for allergies.    [provider]  glucose blood test strip 1 each by Other route as needed for other. Use as instructed One Touch Verio (Blood Glucose Test) One Touch Delica Lancets 33G    [provider]  metoprolol  succinate (TOPROL  XL) 25 MG 24 hr tablet Take 0.5 tablets (12.5 mg total) by mouth daily. 03/14/22   Thukkani, Arun K, MD  MOUNJARO 7.5 MG/0.5ML Pen Inject 7.5 mg into the skin once a week. 08/25/22   [provider]  Multiple Vitamins-Minerals (MULTIVITAMIN WITH MINERALS) tablet Take 1 tablet by mouth daily.     [provider]  nitroGLYCERIN  (NITROSTAT ) 0.4 MG SL tablet Place 1 tablet (0.4 mg total) under the tongue every 5 (five) minutes as needed for chest pain. 04/24/22 07/23/22  Emelia Josefa HERO, NP  NOVOLIN 70/30 KWIKPEN (70-30) 100 UNIT/ML KwikPen Inject 15 Units into the skin 2 (two) times daily. 05/15/21   [provider]  omeprazole (PRILOSEC OTC) 20 MG tablet Take 1 tablet by mouth daily as needed (for acid reflux).    [provider]  rosuvastatin  (CRESTOR ) 10 MG tablet TAKE 1 TABLET DAILY 02/25/23   Thukkani, Arun K, MD  venlafaxine  XR (EFFEXOR -XR) 150 MG 24 hr capsule Take 150 mg by mouth daily. 12/24/21   [provider]  XIGDUO  XR 06-998 MG TB24 Take 1 tablet by mouth 2 (two) times daily. 07/22/21   [provider]    Allergies: Onion, Atorvastatin, Capsicum, and Lactose intolerance (gi)    Review of Systems  Respiratory:  Positive for shortness of breath.     Updated Vital Signs BP (!) 142/95 (BP Location: Right Arm)   Pulse 65   Temp 98.1 F (36.7 C) (Oral)   Resp 16   Ht 5' 9 (1.753 m)   Wt 105.7 kg   SpO2 100%   BMI 34.41 kg/m   Physical Exam Constitutional:      Appearance: He is well-developed.  HENT:     Head: Normocephalic and atraumatic.     Mouth/Throat:     Mouth: Mucous membranes are moist.  Eyes:  Extraocular Movements: Extraocular movements intact.     Pupils: Pupils are equal, round, and reactive to light.  Cardiovascular:     Rate and Rhythm: Normal rate and regular rhythm.  Pulmonary:     Effort: Pulmonary effort is normal.     Breath sounds: Normal breath sounds.  Musculoskeletal:        General: Normal range of motion.     Cervical back: Normal range of motion and neck supple.  Skin:    General: Skin is warm and dry.     Capillary Refill: Capillary refill takes less than 2 seconds.  Neurological:     General: No focal deficit present.     Mental Status: He is alert.     (all labs ordered are  listed, but only abnormal results are displayed) Labs Reviewed  BASIC METABOLIC PANEL WITH GFR - Abnormal; Notable for the following components:      Result Value   Glucose, Bld 302 (*)    Calcium  8.6 (*)    All other components within normal limits  CBC - Abnormal; Notable for the following components:   WBC 11.6 (*)    All other components within normal limits  D-DIMER, QUANTITATIVE  TROPONIN I (HIGH SENSITIVITY)  TROPONIN I (HIGH SENSITIVITY)    EKG: EKG Interpretation Date/Time:  Monday November 03 2023 13:52:14 EDT Ventricular Rate:  83 PR Interval:  162 QRS Duration:  72 QT Interval:  390 QTC Calculation: 458 R Axis:   2  Text Interpretation: Normal sinus rhythm Cannot rule out Anterior infarct , age undetermined Abnormal ECG When compared with ECG of 17-Sep-2022 11:06, No significant change since last tracing Confirmed by Randol Simmonds [phone]) on 11/03/2023 7:18:57 PM  Radiology: DG Chest 2 View Result Date: 11/03/2023 EXAM: 2 VIEW(S) XRAY OF THE CHEST 11/03/2023 02:35:00 PM COMPARISON: 06/12/2022 CLINICAL HISTORY: SOB. Reason for Exam: SOB; Triage Note: Michael Sandoval , a 58 y.o. male was evaluated in triage. Pt complains of fatigue and shortness of breath that has been worse over the past 4 days. Patient was unable to finish his job due to his symptoms. He does have some pressure in ; the chest. History of a MI and coronary stent. Not currently on antiplatelet. No fevers or cough. No unilateral or bilateral leg swelling. He does not get short of breath while in fact. He denies any bleeding symptoms. FINDINGS: LUNGS AND PLEURA: No focal pulmonary opacity. No pulmonary edema. No pleural effusion. No pneumothorax. HEART AND MEDIASTINUM: No acute abnormality of the cardiac and mediastinal silhouettes. BONES AND SOFT TISSUES: No acute osseous abnormality. IMPRESSION: 1. No acute cardiopulmonary process. Electronically signed by: Waddell Calk MD 11/03/2023 03:07 PM EDT RP Workstation:  [id number]     Procedures   Medications Ordered in the ED - No data to display  Clinical Course as of 11/03/23 2306  Mon Nov 03, 2023  2110 [ ]  Cardiology recs [WB]    Clinical Course User Index [WB] Dorcus Fallow, MD    Medical Decision Making Amount and/or Complexity of Data Reviewed Labs: ordered. Radiology: ordered.   58 year old male with PMH of above, significant for CAD with PCI to LAD 09/2021, presenting to the ED with x 4 days of exertional dyspnea and chest pressure without accompanying nausea or diaphoresis.  Patient reports that his symptoms have improved today.  Upon arrival, patient hemodynamically stable in no acute distress.  Normotensive.  Afebrile.  No tachycardia or tachypnea.  Saturating 99% RA.  CXR without evidence of  acute cardiopulmonary abnormality, specifically including pulmonary consolidation or cardiomegaly.  EKG indicative of normal sinus rhythm with ventricular rate of 83 bpm.  No TWI.  No STEMI.  No evidence of preexcitation.  Initial troponin 4.  Patient in appropriate for [facility] given age.  No tachycardia.  Saturating appropriately on room air.  No unilateral leg swelling or hemoptysis.  No recent surgery or trauma.  No history of VTE.  Patient low risk by revised Geneva scoring, therefore low pretest probability, D-dimer ordered to rule out VTE.  D-dimer 0.33 -low pretest probability for VTE at this time.  Metabolic panel significant for hyperglycemia with glucose 302, however no accompanying clinical or laboratory evidence of DKA with bicarbonate 25.  Cardiology team consulted -case discussed and cardiology team plan to evaluate patient at bedside.  Admission for CTA in the a.m. discussed.  Patient to be admitted to cardiology service for for coronary angiography.  Final diagnoses:  Other chest pain    ED Discharge Orders     None          Virgil Slinger, Elsie, MD 11/03/23 7693    Randol Simmonds, MD 11/04/23 1253

## 2023-11-03 NOTE — H&P (Signed)
 Cardiology Admission History and Physical   Patient ID: Michael Sandoval MRN: [id number]; DOB: 12/14/1965   Admission date: 11/03/2023  PCP:  Kip Righter, MD   [facility] Providers Cardiologist:  Lurena MARLA Red, MD    Chief Complaint:  Shortness of breath and chest tightness   Patient Profile: Michael Sandoval is a 58 y.o. male with a hx of CAD s/p PCI of proximal and mid LAD in 09/2021, known 85% OM3 stenosis, HLD, HLD, T2DM, OSA on CPAP, GERD, and chronic sinuisitis who is being seen 11/03/2023 for the evaluation of SOB and chest tightness.  History of Present Illness: Mr. Memmott reports that he's been having exertional shortness of breath for years and has never gotten answers for it. He however reports that on Thursday he started to notice worsening in his SOB especially with exertion. He reported no chest pain; however, when asked more about it, he reported some non-exertional, non-radiating chest tightness. No nausea, vomiting, diaphoresis, palpitations, lightheadedness, or dizziness. He reports that these symptoms improved a few hours ago prior to his presentation to the ED, but he was worried about these symptoms and that's why he presented. He reports that these symptoms are different compared to the symptoms he had with his PCI in 2023 as at that time he was having crushing chest pain.    EKG with sinus rhythm and no acute ischemic changes. Troponin was 4 x2. CXR with no acute processes.   Past Medical History:  Diagnosis Date   Anxiety    Arthritis    Cancer (HCC)    melanoma skin cancer    Daytime somnolence    Decreased hearing    Depression    Diabetes mellitus, type II (HCC)        Dyslipidemia    Elevated blood pressure reading    Elevated coronary artery calcium  score    Excessive daytime sleepiness    GERD (gastroesophageal reflux disease)    HTN (hypertension)    HTN (hypertension)    Hyperlipidemia    Lightheadedness    Non-restorative sleep     Obesity    Osteoarthritis of hip    Pneumonia    1990    Sinusitis    Unspecified disorder of nose and nasal sinuses    Past Surgical History:  Procedure Laterality Date   colonoscopy   2006   CORONARY STENT INTERVENTION N/A 09/11/2021   Procedure: CORONARY STENT INTERVENTION;  Surgeon: Burnard Debby LABOR, MD;  Location: [facility] INVASIVE CV LAB;  Service: Cardiovascular;  Laterality: N/A;   CORONARY ULTRASOUND/IVUS N/A 09/11/2021   Procedure: Intravascular Ultrasound/IVUS;  Surgeon: Burnard Debby LABOR, MD;  Location: [facility] INVASIVE CV LAB;  Service: Cardiovascular;  Laterality: N/A;   HAND SURGERY     crush injury , small screw in place per surgeon    skin cancer   several years ago   TONSILLECTOMY AND ADENOIDECTOMY     TOTAL HIP ARTHROPLASTY Right 07/09/2018   Procedure: TOTAL HIP ARTHROPLASTY ANTERIOR APPROACH;  Surgeon: Fidel Rogue, MD;  Location: [facility] ORS;  Service: Orthopedics;  Laterality: Right;     Medications Prior to Admission: Prior to Admission medications   Medication Sig Start Date End Date Taking? Authorizing Provider  Accu-Chek FastClix Lancets MISC  06/18/22   [provider]  cetirizine (ZYRTEC) 10 MG tablet Take 10 mg by mouth as needed for allergies.    [provider]  clopidogrel  (PLAVIX ) 75 MG tablet TAKE 1 TABLET DAILY 02/25/23   Thukkani,  Arun K, MD  ezetimibe  (ZETIA ) 10 MG tablet TAKE 1 TABLET DAILY 03/24/23   Thukkani, Arun K, MD  fluticasone  (FLONASE ) 50 MCG/ACT nasal spray Place 1 spray into both nostrils daily as needed for allergies.    [provider]  glucose blood test strip 1 each by Other route as needed for other. Use as instructed One Touch Verio (Blood Glucose Test) One Touch Delica Lancets 33G    [provider]  metoprolol  succinate (TOPROL  XL) 25 MG 24 hr tablet Take 0.5 tablets (12.5 mg total) by mouth daily. 03/14/22   Thukkani, Arun K, MD  MOUNJARO 7.5 MG/0.5ML Pen Inject 7.5 mg into the skin once a week. 08/25/22   [provider]  Multiple Vitamins-Minerals (MULTIVITAMIN WITH MINERALS) tablet Take 1 tablet by mouth daily.    [provider]  nitroGLYCERIN  (NITROSTAT ) 0.4 MG SL tablet Place 1 tablet (0.4 mg total) under the tongue every 5 (five) minutes as needed for chest pain. 04/24/22 07/23/22  Emelia Josefa HERO, NP  NOVOLIN 70/30 KWIKPEN (70-30) 100 UNIT/ML KwikPen Inject 15 Units into the skin 2 (two) times daily. 05/15/21   [provider]  omeprazole (PRILOSEC OTC) 20 MG tablet Take 1 tablet by mouth daily as needed (for acid reflux).    [provider]  rosuvastatin  (CRESTOR ) 10 MG tablet TAKE 1 TABLET DAILY 02/25/23   Thukkani, Arun K, MD  venlafaxine  XR (EFFEXOR -XR) 150 MG 24 hr capsule Take 150 mg by mouth daily. 12/24/21   [provider]  XIGDUO  XR 06-998 MG TB24 Take 1 tablet by mouth 2 (two) times daily. 07/22/21   [provider]     Allergies:    Allergies  Allergen Reactions   Onion Diarrhea   Atorvastatin Diarrhea   Capsicum Diarrhea and Nausea Only    Bell peppers   Lactose Intolerance (Gi) Diarrhea    Social History:   Social History   Socioeconomic History   Marital status: Single    Spouse name: Not on file   Number of children: Not on file   Years of education: Not on file   Highest education level: Not on file  Occupational History   Not on file  Tobacco Use   Smoking status: Never    Passive exposure: Past   Smokeless tobacco: Never  Vaping Use   Vaping status: Never Used  Substance and Sexual Activity   Alcohol  use: Yes    Comment: rare   Drug use: No   Sexual activity: Not on file  Other Topics Concern   Not on file  Social History Narrative   Works Scientist, product/process development support.     Right handed    Social Drivers of Health   Financial Resource Strain: Low Risk  (09/04/2021)   Received from [facility]   Overall Financial Resource Strain (CARDIA)    Difficulty of Paying Living Expenses: Not hard at all  Food Insecurity:  Not on file  Transportation Needs: Not on file  Physical Activity: Not on file  Stress: No Stress Concern Present (09/04/2021)   Received from [facility] Occupational Health - Occupational Stress Questionnaire    Feeling of Stress : Only a little  Social Connections: Unknown (09/04/2021)   Received from [facility]   Social Network    Social Network: Not on file  Intimate Partner Violence: Unknown (09/04/2021)   Received from [facility]   HITS    Physically Hurt: Not on file  Insult or Talk Down To: Not on file    Threaten Physical Harm: Not on file    Scream or Curse: Not on file     Family History:   The patient's family history includes Heart attack (age of onset: 45) in his mother; Hypertension in his father.    ROS:  Please see the history of present illness.  All other ROS reviewed and negative.     Physical Exam/Data: Vitals:   11/03/23 1321 11/03/23 1352 11/03/23 1937  BP: (!) 138/91  (!) 142/95  Pulse: 71  65  Resp: 16  16  Temp: 97.6 F (36.4 C)  98.1 F (36.7 C)  TempSrc:   Oral  SpO2: 99%  100%  Weight:  105.7 kg   Height:  5' 9 (1.753 m)    No intake or output data in the 24 hours ending 11/03/23 2314    11/03/2023    1:52 PM 09/17/2022   11:02 AM 07/05/2022   10:39 AM  Last 3 Weights  Weight (lbs) 233 lb 236 lb 3.2 oz 243 lb 13.3 oz  Weight (kg) 105.688 kg 107.14 kg 110.6 kg     Body mass index is 34.41 kg/m.  General:  Well nourished, well developed, in no acute distress HEENT: normal Neck: no JVD Vascular: Distal pulses 2+ bilaterally   Cardiac:  normal S1, S2; RRR; no murmur  Lungs:  clear to auscultation bilaterally, no wheezing, rhonchi or rales  Abd: soft, nontender, no hepatomegaly  Ext: no edema Musculoskeletal:  No deformities, BUE and BLE strength normal and equal Skin: warm and dry  Neuro:  CNs 2-12 intact, no focal abnormalities noted Psych:  Normal affect   EKG:  The ECG that was done on 9/22 was  personally reviewed and demonstrates sinus rhythm and no acute ST/T wave changes.  Relevant CV Studies: TTE 09/2021:  1. Left ventricular ejection fraction, by estimation, is 55 to 60%. The  left ventricle has normal function. The left ventricle has no regional  wall motion abnormalities. There is mild left ventricular hypertrophy.  Left ventricular diastolic parameters  are consistent with Grade I diastolic dysfunction (impaired relaxation).   2. Right ventricular systolic function is normal. The right ventricular  size is normal. Tricuspid regurgitation signal is inadequate for assessing  PA pressure.   3. The mitral valve is normal in structure. No evidence of mitral valve  regurgitation. No evidence of mitral stenosis.   4. The aortic valve is tricuspid. Aortic valve regurgitation is not  visualized. No aortic stenosis is present.   5. The inferior vena cava is normal in size with <50% respiratory  variability, suggesting right atrial pressure of 8 mmHg.   LHC 09/11/2021   2nd Mrg lesion is 85% stenosed.   1st Mrg lesion is 20% stenosed.   Mid LAD lesion is 90% stenosed.   A drug-eluting stent was successfully placed.   Post intervention, there is a 0% residual stenosis.   The patient had undergone prior diagnostic catheterization at [facility]) on September 04, 2021.  CD images were reviewed and revealed a long 85 to 90% LAD stenosis in the proximal to mid LAD but not including the large diagonal vessel and he had minimal narrowing in the high marginal vessel with focal 85% stenosis in a very small second marginal branch of the circumflex.  He had a normal dominant RCA. Subsequent surgical consultation with Dr. Fleeta Brooke was done yesterday who felt the patient would be best treated  with PCI.  He was admitted last evening with recurrent chest pain and presents now for PCI to his LAD   Intravascular ultrasound showed mild to moderate calcification with the LAD vessel size at  3.75 mm.   PCI was performed with predilatation with a 2.0 x 20 mm balloon, Score-flex 2.5 x 15 mm cutting balloon with DES stenting with a 3.5 x 24 mm Synergy stent postdilated to 3.75 mm with the stenoses being reduced to 0% and brisk TIMI-3 flow.   CPET 09/2021: Exercise testing with gas exchange demonstrates mild functional impairment when compared to matched sedentary norms. There is no cardiopulmonary abnormality. Patient appears primarily limited due to body habitus and deconditioning. VE/VCO2 slope is elevated and could indicate increased pulmonary pressures, however there is a hyper-ventilatory component playing a role in that elevated VE/VCO2 slope.   Laboratory Data: High Sensitivity Troponin:   Recent Labs  Lab 11/03/23 1423 11/03/23 1618  TROPONINIHS 4 4      Chemistry Recent Labs  Lab 11/03/23 1423  NA 135  K 4.8  CL 101  CO2 25  GLUCOSE 302*  BUN 12  CREATININE 1.01  CALCIUM  8.6*  GFRNONAA >60  ANIONGAP 9    No results for input(s): PROT, ALBUMIN, AST, ALT, ALKPHOS, BILITOT in the last 168 hours. Lipids No results for input(s): CHOL, TRIG, HDL, LABVLDL, LDLCALC, CHOLHDL in the last 168 hours. Hematology Recent Labs  Lab 11/03/23 1423  WBC 11.6*  RBC 4.85  HGB 14.7  HCT 44.2  MCV 91.1  MCH 30.3  MCHC 33.3  RDW 12.8  PLT 272   Thyroid  No results for input(s): TSH, FREET4 in the last 168 hours. BNPNo results for input(s): BNP, PROBNP in the last 168 hours.  DDimer  Recent Labs  Lab 11/03/23 2001  DDIMER 0.33    Radiology/Studies:  DG Chest 2 View Result Date: 11/03/2023 EXAM: 2 VIEW(S) XRAY OF THE CHEST 11/03/2023 02:35:00 PM COMPARISON: 06/12/2022 CLINICAL HISTORY: SOB. Reason for Exam: SOB; Triage Note: EDWARDO WOJNAROWSKI , a 58 y.o. male was evaluated in triage. Pt complains of fatigue and shortness of breath that has been worse over the past 4 days. Patient was unable to finish his job due to his symptoms. He does  have some pressure in ; the chest. History of a MI and coronary stent. Not currently on antiplatelet. No fevers or cough. No unilateral or bilateral leg swelling. He does not get short of breath while in fact. He denies any bleeding symptoms. FINDINGS: LUNGS AND PLEURA: No focal pulmonary opacity. No pulmonary edema. No pleural effusion. No pneumothorax. HEART AND MEDIASTINUM: No acute abnormality of the cardiac and mediastinal silhouettes. BONES AND SOFT TISSUES: No acute osseous abnormality. IMPRESSION: 1. No acute cardiopulmonary process. Electronically signed by: Waddell Calk MD 11/03/2023 03:07 PM EDT RP Workstation: [id number]     Assessment and Plan: Shortness of breath and chest tightness  Patient presents with atypical symptoms of non-exertional SOB and chest tightness that has been present for years but have gotten worse over the past 5 days. He however reports that these symptoms improved prior to presentation to the ED. EKG with no acute ischemic changes, and troponin values were negative. He does have known disease in OM3; however, vessel was small; and thus, no intervention was done. Given no symptoms at the moment and low concern for unstable angina, would proceed with non-invasive testing with treadmill stress echo.    2. CAD - Continue Plavix  monotherapy  - Continue Zetia   and rosuvastatin  10 mg daily - Continue metoprolol  succinate 12.5 mg daily  3. T2DM - SSI  4. GERD - Continue omeprazole    Risk Assessment/Risk Scores:    Code Status: Full Code  Severity of Illness: The appropriate patient status for this patient is OBSERVATION. Observation status is judged to be reasonable and necessary in order to provide the required intensity of service to ensure the patient's safety. The patient's presenting symptoms, physical exam findings, and initial radiographic and laboratory data in the context of their medical condition is felt to place them at decreased risk for further  clinical deterioration. Furthermore, it is anticipated that the patient will be medically stable for discharge from the hospital within 2 midnights of admission.   For questions or updates, please contact [facility] Please consult [email or web address] for contact info under       Signed, Gillian CHRISTELLA Cass, MD  11/03/2023 11:14 PM

## 2023-11-03 NOTE — ED Provider Triage Note (Signed)
 Emergency Medicine Provider Triage Evaluation Note  Michael Sandoval , a 58 y.o. male  was evaluated in triage.  Pt complains of fatigue and shortness of breath that has been worse over the past 4 days.  Patient was unable to finish his job due to his symptoms.  He does have some pressure in the chest.  History of a MI and coronary stent.  Not currently on antiplatelet.  No fevers or cough.  No unilateral or bilateral leg swelling.  He does not get short of breath while in fact.  He denies any bleeding symptoms.  Review of Systems  Positive: Shortness of breath, fatigue Negative: Fever  Physical Exam  BP (!) 138/91 (BP Location: Right Arm)   Pulse 71   Temp 97.6 F (36.4 C)   Resp 16   Ht 5' 9 (1.753 m)   Wt 105.7 kg   SpO2 99%   BMI 34.41 kg/m  Gen:   Awake, no distress   Resp:  Normal effort  MSK:   Moves extremities without difficulty  Other:  Heart regular rate and rhythm  Medical Decision Making  Medically screening exam initiated at 2:08 PM.  Appropriate orders placed.  Michael Sandoval was informed that the remainder of the evaluation will be completed by another provider, this initial triage assessment does not replace that evaluation, and the importance of remaining in the ED until their evaluation is complete.     Desiderio Chew, PA-C 11/03/23 1409

## 2023-11-03 NOTE — Consult Note (Incomplete)
 Cardiology Consultation   Patient ID: Michael Sandoval MRN: [id number]; DOB: 10-25-1965  Admit date: 11/03/2023 Date of Consult: 11/03/2023  PCP:  Kip Righter, MD    [facility] Providers Cardiologist:  Arun K Thukkani, MD   { Click here to update MD or APP on Care Team, Refresh:1}     Patient Profile: Michael Sandoval is a 58 y.o. male with a hx of CAD s/p PCI of proximal and mid LAD in 09/2021, known 85% OM3 stenosis, HLD, HLD, T2DM, OSA on CPAP, GERD, and chronic sinuisitis who is being seen 11/03/2023 for the evaluation of SOB and chest pain.     History of Present Illness: Michael Sandoval reports that he's been having exertional shortness of breath for years and has never gotten answers for it. He however reports that on Thursday he started to notice worsening in his SOB especially with exertion. He reported no chest pain; however, when asked more about it, he reported some non-exertional, non-radiating chest tightness. No nausea, vomiting, diaphoresis, palpitations, lightheadedness, or dizziness. He reports that these symptoms improved a few hours ago prior to his presentation to the ED, but he was worried about these symptoms and that's why he presented. He reports that these symptoms are different compared to the symptoms he had with his PCI in 2023 as at that time he was having crushing chest pain.   EKG with sinus rhythm and no acute ischemic changes. Troponin was 4 x2. CXR with no acute processes.   Past Medical History:  Diagnosis Date   Anxiety    Arthritis    Cancer (HCC)    melanoma skin cancer    Daytime somnolence    Decreased hearing    Depression    Diabetes mellitus, type II (HCC)        Dyslipidemia    Elevated blood pressure reading    Elevated coronary artery calcium  score    Excessive daytime sleepiness    GERD (gastroesophageal reflux disease)    HTN (hypertension)    HTN (hypertension)    Hyperlipidemia    Lightheadedness    Non-restorative sleep     Obesity    Osteoarthritis of hip    Pneumonia    1990    Sinusitis    Unspecified disorder of nose and nasal sinuses     Past Surgical History:  Procedure Laterality Date   colonoscopy   2006   CORONARY STENT INTERVENTION N/A 09/11/2021   Procedure: CORONARY STENT INTERVENTION;  Surgeon: Burnard Debby LABOR, MD;  Location: [facility] INVASIVE CV LAB;  Service: Cardiovascular;  Laterality: N/A;   CORONARY ULTRASOUND/IVUS N/A 09/11/2021   Procedure: Intravascular Ultrasound/IVUS;  Surgeon: Burnard Debby LABOR, MD;  Location: [facility] INVASIVE CV LAB;  Service: Cardiovascular;  Laterality: N/A;   HAND SURGERY     crush injury , small screw in place per surgeon    skin cancer   several years ago   TONSILLECTOMY AND ADENOIDECTOMY     TOTAL HIP ARTHROPLASTY Right 07/09/2018   Procedure: TOTAL HIP ARTHROPLASTY ANTERIOR APPROACH;  Surgeon: Fidel Rogue, MD;  Location: [facility] ORS;  Service: Orthopedics;  Laterality: Right;     {Home Medications (Optional):21181}  Scheduled Meds:  Continuous Infusions:  PRN Meds:   Allergies:    Allergies  Allergen Reactions   Onion Diarrhea   Atorvastatin Diarrhea   Capsicum Diarrhea and Nausea Only    Bell peppers   Lactose Intolerance (Gi) Diarrhea    Social History:   Social History  Socioeconomic History   Marital status: Single    Spouse name: Not on file   Number of children: Not on file   Years of education: Not on file   Highest education level: Not on file  Occupational History   Not on file  Tobacco Use   Smoking status: Never    Passive exposure: Past   Smokeless tobacco: Never  Vaping Use   Vaping status: Never Used  Substance and Sexual Activity   Alcohol  use: Yes    Comment: rare   Drug use: No   Sexual activity: Not on file  Other Topics Concern   Not on file  Social History Narrative   Works Scientist, product/process development support.     Right handed    Social Drivers of Health   Financial Resource Strain: Low Risk  (09/04/2021)   Received from [facility]   Overall Financial Resource Strain (CARDIA)    Difficulty of Paying Living Expenses: Not hard at all  Food Insecurity: Not on file  Transportation Needs: Not on file  Physical Activity: Not on file  Stress: No Stress Concern Present (09/04/2021)   Received from [facility] - Occupational Stress Questionnaire    Feeling of Stress : Only a little  Social Connections: Unknown (09/04/2021)   Received from [facility]   Social Network    Social Network: Not on file  Intimate Partner Violence: Unknown (09/04/2021)   Received from [facility]   HITS    Physically Hurt: Not on file    Insult or Talk Down To: Not on file    Threaten Physical Harm: Not on file    Scream or Curse: Not on file    Family History:   *** Family History  Problem Relation Age of Onset   Heart attack Mother 15   Hypertension Father      ROS:  Please see the history of present illness.  *** All other ROS reviewed and negative.     Physical Exam/Data: Vitals:   11/03/23 1321 11/03/23 1352 11/03/23 1937  BP: (!) 138/91  (!) 142/95  Pulse: 71  65  Resp: 16  16  Temp: 97.6 F (36.4 C)  98.1 F (36.7 C)  TempSrc:   Oral  SpO2: 99%  100%  Weight:  105.7 kg   Height:  5' 9 (1.753 m)    No intake or output data in the 24 hours ending 11/03/23 2218    11/03/2023    1:52 PM 09/17/2022   11:02 AM 07/05/2022   10:39 AM  Last 3 Weights  Weight (lbs) 233 lb 236 lb 3.2 oz 243 lb 13.3 oz  Weight (kg) 105.688 kg 107.14 kg 110.6 kg     Body mass index is 34.41 kg/m.  General:  Well nourished, well developed, in no acute distress*** HEENT: normal Neck: no JVD Vascular: No carotid bruits; Distal pulses 2+ bilaterally Cardiac:  normal S1, S2; RRR; no murmur *** Lungs:  clear to auscultation bilaterally, no wheezing, rhonchi or rales  Abd: soft, nontender, no hepatomegaly  Ext: no edema Musculoskeletal:  No deformities, BUE and BLE strength normal and  equal Skin: warm and dry  Neuro:  CNs 2-12 intact, no focal abnormalities noted Psych:  Normal affect   EKG:  The EKG was personally reviewed and demonstrates:  *** Telemetry:  Telemetry was personally reviewed and demonstrates:  ***  Relevant CV Studies: ***  Laboratory Data: High Sensitivity Troponin:   Recent  Labs  Lab 11/03/23 1423 11/03/23 1618  TROPONINIHS 4 4     Chemistry Recent Labs  Lab 11/03/23 1423  NA 135  K 4.8  CL 101  CO2 25  GLUCOSE 302*  BUN 12  CREATININE 1.01  CALCIUM  8.6*  GFRNONAA >60  ANIONGAP 9    No results for input(s): PROT, ALBUMIN, AST, ALT, ALKPHOS, BILITOT in the last 168 hours. Lipids No results for input(s): CHOL, TRIG, HDL, LABVLDL, LDLCALC, CHOLHDL in the last 168 hours.  Hematology Recent Labs  Lab 11/03/23 1423  WBC 11.6*  RBC 4.85  HGB 14.7  HCT 44.2  MCV 91.1  MCH 30.3  MCHC 33.3  RDW 12.8  PLT 272   Thyroid  No results for input(s): TSH, FREET4 in the last 168 hours.  BNPNo results for input(s): BNP, PROBNP in the last 168 hours.  DDimer  Recent Labs  Lab 11/03/23 2001  DDIMER 0.33    Radiology/Studies:  DG Chest 2 View Result Date: 11/03/2023 EXAM: 2 VIEW(S) XRAY OF THE CHEST 11/03/2023 02:35:00 PM COMPARISON: 06/12/2022 CLINICAL HISTORY: SOB. Reason for Exam: SOB; Triage Note: Michael Sandoval , a 58 y.o. male was evaluated in triage. Pt complains of fatigue and shortness of breath that has been worse over the past 4 days. Patient was unable to finish his job due to his symptoms. He does have some pressure in ; the chest. History of a MI and coronary stent. Not currently on antiplatelet. No fevers or cough. No unilateral or bilateral leg swelling. He does not get short of breath while in fact. He denies any bleeding symptoms. FINDINGS: LUNGS AND PLEURA: No focal pulmonary opacity. No pulmonary edema. No pleural effusion. No pneumothorax. HEART AND MEDIASTINUM: No acute abnormality of  the cardiac and mediastinal silhouettes. BONES AND SOFT TISSUES: No acute osseous abnormality. IMPRESSION: 1. No acute cardiopulmonary process. Electronically signed by: Waddell Calk MD 11/03/2023 03:07 PM EDT RP Workstation: [id number]     Assessment and Plan: ***   Risk Assessment/Risk Scores: {Complete the following score calculators/questions to meet required metrics.  Press F2         :[id number]}   {Is the patient being seen for unstable angina, ACS, NSTEMI or STEMI?:[phone]} {Does this patient have CHF or CHF symptoms?      :[id number]} {Does this patient have ATRIAL FIBRILLATION?:[phone]}  {Are we signing off today?:[id number]}  For questions or updates, please contact [facility] Please consult [email or web address] for contact info under    {TIP  Split Shared Billing  Do NOT delete any part of this including brackets If split shared billing is based upon MDM, disregard If billing will be based upon TIME you MUST document the number of minutes and a detailed list of what was done in that time in the following format Example - I spent ** minutes seeing this patient. During that time I reviewed their history, evaluated their symptoms, reviewed available labs, EKGs, studies, performed an exam and formulated an assessment and plan   :1} {Select this only if you need to document critical care time (Optional):[phone]} Signed, Gillian CHRISTELLA Cass, MD  11/03/2023 10:18 PM

## 2023-11-03 NOTE — ED Triage Notes (Signed)
 Pt c.o sob and fatigue since thursday. Pt c.o intermittent chest tightness. Some nausea.

## 2023-11-04 LAB — CBG MONITORING, ED: Glucose-Capillary: 275 mg/dL — ABNORMAL HIGH (ref 70–99)

## 2023-11-04 MED ORDER — ASPIRIN 81 MG PO TBEC: 81.0000 mg | DELAYED_RELEASE_TABLET | Freq: Every day | ORAL | Status: DC

## 2023-11-04 MED ORDER — ONDANSETRON HCL 4 MG/2ML IJ SOLN: 4.0000 mg | Freq: Four times a day (QID) | INTRAMUSCULAR | Status: DC | PRN

## 2023-11-04 MED ORDER — NITROGLYCERIN 0.4 MG SL SUBL: 0.4000 mg | SUBLINGUAL_TABLET | SUBLINGUAL | Status: DC | PRN

## 2023-11-04 MED ORDER — ACETAMINOPHEN 325 MG PO TABS: 650.0000 mg | ORAL_TABLET | ORAL | Status: DC | PRN

## 2023-11-04 MED ORDER — PANTOPRAZOLE SODIUM 40 MG PO TBEC: 40.0000 mg | DELAYED_RELEASE_TABLET | Freq: Every day | ORAL | Status: DC | PRN

## 2023-11-04 MED ORDER — CLOPIDOGREL BISULFATE 75 MG PO TABS: 75.0000 mg | ORAL_TABLET | Freq: Every day | ORAL | Status: DC

## 2023-11-04 MED ORDER — METOPROLOL SUCCINATE ER 25 MG PO TB24: 12.5000 mg | ORAL_TABLET | Freq: Every day | ORAL | Status: DC

## 2023-11-04 MED ORDER — EZETIMIBE 10 MG PO TABS: 10.0000 mg | ORAL_TABLET | Freq: Every day | ORAL | Status: DC

## 2023-11-04 MED ORDER — ENOXAPARIN SODIUM 40 MG/0.4ML IJ SOSY: 40.0000 mg | PREFILLED_SYRINGE | Freq: Every day | INTRAMUSCULAR | Status: DC

## 2023-11-04 MED ORDER — VENLAFAXINE HCL ER 75 MG PO CP24: 150.0000 mg | ORAL_CAPSULE | Freq: Every day | ORAL | Status: DC

## 2023-11-04 MED ORDER — INSULIN ASPART 100 UNIT/ML IJ SOLN: 0.0000 [IU] | Freq: Three times a day (TID) | INTRAMUSCULAR | Status: DC

## 2023-11-04 MED ORDER — ROSUVASTATIN CALCIUM 5 MG PO TABS: 10.0000 mg | ORAL_TABLET | Freq: Every day | ORAL | Status: DC

## 2023-11-04 NOTE — ED Notes (Signed)
 CCMD called.

## 2023-11-04 NOTE — ED Notes (Signed)
 Cardiology at bedside.

## 2023-11-04 NOTE — Discharge Summary (Signed)
 Discharge Summary   Patient ID: Michael Sandoval MRN: [id number]; DOB: [birth year]  Admit date: 11/03/2023 Discharge date: 11/04/2023  PCP:  Kip Righter, MD   [facility] Providers Cardiologist:  Lurena MARLA Red, MD     Discharge Diagnoses  Principal Problem:   Chest pain Active Problems:   Hyperlipidemia   Coronary artery disease involving coronary bypass graft of native heart with angina pectoris   Type 2 diabetes mellitus with foot ulcer, without long-term current use of insulin  [facility])   Diagnostic Studies/Procedures   N/a  _____________   History of Present Illness   Michael Sandoval is a 58 y.o. male with a hx of CAD s/p PCI of proximal and mid LAD in 09/2021, known 85% OM3 stenosis, HLD, HLD, T2DM, OSA on CPAP, GERD, and chronic sinuisitis who was seen 11/03/2023 for the evaluation of SOB and chest tightness. Michael Sandoval reported that he's been having exertional shortness of breath for years and has never gotten answers for it. He however reports that on Thursday he started to notice worsening in his SOB especially with exertion. He reported no chest pain; however, when asked more about it, he reported some non-exertional, non-radiating chest tightness. No nausea, vomiting, diaphoresis, palpitations, lightheadedness, or dizziness. He reported that these symptoms improved a few hours ago prior to his presentation to the ED, but he was worried about these symptoms and that's why he presented. He reported that these symptoms are different compared to the symptoms he had with his PCI in 2023 as at that time he was having crushing chest pain.    EKG with sinus rhythm and no acute ischemic changes. Troponin was 4 x2. CXR with no acute processes. Admitted for further management.    Hospital Course    Shortness of breath Chest tightness CAD status post PCI of proximal/mid LAD '23 -- Last cardiac catheterization 09/2021 with stenting of the proximal/mid LAD with known 85% OM 3  stenosis which was noted to be small caliber vessel. -- High-sensitivity troponin negative x 2, EKG shows sinus rhythm without acute ST/T wave abnormalities. -- he reports symptoms have been present for 6-7 years not having much improvement after stenting of the LAD back in 2023. Had been off of all cardiac meds for about 6 months 2/2 issues with his pharmacy (this has caused him significant anxiety) -- He has ruled out at this time, agreeable to resume ASA 81mg  daily along with home statin. Did attempt to add metoprolol /losartan but he declines at this time -- will schedule for outpatient follow up in the office in 2 weeks with consideration of cardiac PET vs exercise treadmill  Hypertension -- blood pressures elevated in the ED, attempted to add metoprolol /losartan but he declines at this time -- will bring his medications/list to follow up appt   HLD -- agreeable to resume his home Crestor   DM -- continue home regimen   Did the patient have an acute coronary syndrome (MI, NSTEMI, STEMI, etc) this admission?:  No                               Did the patient have a percutaneous coronary intervention (stent / angioplasty)?:  No.    _____________  Discharge Vitals Blood pressure 138/77, pulse 62, temperature 98.3 F (36.8 C), resp. rate 14, height 5' 9 (1.753 m), weight 105.7 kg, SpO2 96%.  Filed Weights   11/03/23 1352  Weight: 105.7 kg  Labs & Radiologic Studies  CBC Recent Labs    11/03/23 1423  WBC 11.6*  HGB 14.7  HCT 44.2  MCV 91.1  PLT 272   Basic Metabolic Panel Recent Labs    90/77/74 1423  NA 135  K 4.8  CL 101  CO2 25  GLUCOSE 302*  BUN 12  CREATININE 1.01  CALCIUM  8.6*   Liver Function Tests No results for input(s): AST, ALT, ALKPHOS, BILITOT, PROT, ALBUMIN in the last 72 hours. No results for input(s): LIPASE, AMYLASE in the last 72 hours. High Sensitivity Troponin:   Recent Labs  Lab 11/03/23 1423 11/03/23 1618  TROPONINIHS  4 4    No results for input(s): TRNPT in the last 720 hours.  BNP Invalid input(s): POCBNP No results for input(s): PROBNP in the last 72 hours.  No results for input(s): BNP in the last 72 hours.  D-Dimer Recent Labs    11/03/23 2001  DDIMER 0.33   Hemoglobin A1C No results for input(s): HGBA1C in the last 72 hours. Fasting Lipid Panel No results for input(s): CHOL, HDL, LDLCALC, TRIG, CHOLHDL, LDLDIRECT in the last 72 hours. Lipoprotein (a)  Date/Time Value Ref Range Status  09/12/2021 02:19 AM 8.4 <75.0 nmol/L Final    Comment:    (NOTE) Note:  Values greater than or equal to 75.0 nmol/L may       indicate an independent risk factor for CHD,       but must be evaluated with caution when applied       to non-Caucasian populations due to the       influence of genetic factors on Lp(a) across       ethnicities. Performed At: [facility] [address] [place], [place] [id number] Jennette Shorter MD Ey:[id number]     Thyroid  Function Tests No results for input(s): TSH, T4TOTAL, T3FREE, THYROIDAB in the last 72 hours.  Invalid input(s): FREET3 _____________  DG Chest 2 View Result Date: 11/03/2023 EXAM: 2 VIEW(S) XRAY OF THE CHEST 11/03/2023 02:35:00 PM COMPARISON: 06/12/2022 CLINICAL HISTORY: SOB. Reason for Exam: SOB; Triage Note: Michael Sandoval , a 58 y.o. male was evaluated in triage. Pt complains of fatigue and shortness of breath that has been worse over the past 4 days. Patient was unable to finish his job due to his symptoms. He does have some pressure in ; the chest. History of a MI and coronary stent. Not currently on antiplatelet. No fevers or cough. No unilateral or bilateral leg swelling. He does not get short of breath while in fact. He denies any bleeding symptoms. FINDINGS: LUNGS AND PLEURA: No focal pulmonary opacity. No pulmonary edema. No pleural effusion. No pneumothorax. HEART AND MEDIASTINUM: No acute abnormality of  the cardiac and mediastinal silhouettes. BONES AND SOFT TISSUES: No acute osseous abnormality. IMPRESSION: 1. No acute cardiopulmonary process. Electronically signed by: Waddell Calk MD 11/03/2023 03:07 PM EDT RP Workstation: [id number]    Disposition Pt is being discharged home today in good condition.  Follow-up Plans & Appointments  Discharge Instructions     Diet - low sodium heart healthy   Complete by: As directed    Increase activity slowly   Complete by: As directed        Discharge Medications Allergies as of 11/04/2023       Reactions   Onion Diarrhea   Atorvastatin Diarrhea   Capsicum Diarrhea, Nausea Only   Bell peppers   Lactose Intolerance (gi) Diarrhea  Medication List     TAKE these medications    acetaminophen  650 MG CR tablet Commonly known as: TYLENOL  Take 1,300 mg by mouth every 8 (eight) hours as needed for pain.   aspirin  EC 81 MG tablet Take 1 tablet (81 mg total) by mouth daily. Swallow whole.   cetirizine 10 MG tablet Commonly known as: ZYRTEC Take 10 mg by mouth as needed for allergies.   fluticasone  50 MCG/ACT nasal spray Commonly known as: FLONASE  Place 1 spray into both nostrils daily as needed for allergies.   hydrocortisone cream 1 % Apply 1 Application topically daily as needed for itching (Rash).   ketoconazole 2 % cream Commonly known as: NIZORAL Apply 1 Application topically 2 (two) times daily.   Mounjaro 5 MG/0.5ML Pen Generic drug: tirzepatide Inject 5 mg into the skin once a week. Inject 5mg  subcutaneously weekly on Sunday.   nitroGLYCERIN  0.4 MG SL tablet Commonly known as: NITROSTAT  Place 1 tablet (0.4 mg total) under the tongue every 5 (five) minutes as needed for chest pain.   NovoLOG  Mix 70/30 FlexPen (70-30) 100 UNIT/ML FlexPen Generic drug: insulin  aspart protamine - aspart Inject 10-15 Units into the skin 2 (two) times daily before a meal.   PriLOSEC OTC 20 MG tablet Generic drug:  omeprazole Take 1 tablet by mouth daily as needed (for acid reflux).   rosuvastatin  10 MG tablet Commonly known as: CRESTOR  TAKE 1 TABLET DAILY   venlafaxine  XR 150 MG 24 hr capsule Commonly known as: EFFEXOR -XR Take 150 mg by mouth at bedtime.   Xigduo  XR 06-998 MG Tb24 Generic drug: Dapagliflozin  Pro-metFORMIN  ER Take 1 tablet by mouth 2 (two) times daily.         Outstanding Labs/Studies  N/a   Duration of Discharge Encounter: APP Time: 25 minutes   Signed, Manuelita Rummer, NP 11/04/2023, 8:43 AM

## 2023-11-04 NOTE — ED Notes (Signed)
 Pt verbalized discharge instructions. Opportunity given to ask questions.

## 2023-11-12 NOTE — Progress Notes (Unsigned)
 Cardiology Clinic Note   Patient Name: Michael Sandoval Date of Encounter: 11/14/2023  Primary Care Provider:  Kip Righter, MD Primary Cardiologist:  Arun K Thukkani, MD  Patient Profile    Michael Sandoval 58 year old male presents the clinic today for follow-up evaluation of his chest pain.  Past Medical History    Past Medical History:  Diagnosis Date   Anxiety    Arthritis    Cancer (HCC)    melanoma skin cancer    Daytime somnolence    Decreased hearing    Depression    Diabetes mellitus, type II (HCC)        Dyslipidemia    Elevated blood pressure reading    Elevated coronary artery calcium  score    Excessive daytime sleepiness    GERD (gastroesophageal reflux disease)    HTN (hypertension)    HTN (hypertension)    Hyperlipidemia    Lightheadedness    Non-restorative sleep    Obesity    Osteoarthritis of hip    Pneumonia    1990    Sinusitis    Unspecified disorder of nose and nasal sinuses    Past Surgical History:  Procedure Laterality Date   colonoscopy   2006   CORONARY STENT INTERVENTION N/A 09/11/2021   Procedure: CORONARY STENT INTERVENTION;  Surgeon: Burnard Debby LABOR, MD;  Location: [facility] INVASIVE CV LAB;  Service: Cardiovascular;  Laterality: N/A;   CORONARY ULTRASOUND/IVUS N/A 09/11/2021   Procedure: Intravascular Ultrasound/IVUS;  Surgeon: Burnard Debby LABOR, MD;  Location: [facility] INVASIVE CV LAB;  Service: Cardiovascular;  Laterality: N/A;   HAND SURGERY     crush injury , small screw in place per surgeon    skin cancer   several years ago   TONSILLECTOMY AND ADENOIDECTOMY     TOTAL HIP ARTHROPLASTY Right 07/09/2018   Procedure: TOTAL HIP ARTHROPLASTY ANTERIOR APPROACH;  Surgeon: Fidel Rogue, MD;  Location: [facility];  Service: Orthopedics;  Laterality: Right;    Allergies  Allergies  Allergen Reactions   Onion Diarrhea   Atorvastatin Diarrhea   Capsicum Diarrhea and Nausea Only    Bell peppers   Lactose Intolerance (Gi) Diarrhea    History  of Present Illness    Michael Sandoval has a PMH of HTN, NSTEMI, chronic sinusitis, GERD, type 2 diabetes, right hand fracture, daytime somnolence, hyperlipidemia, morbid obesity, anxiety, depression, and sleep disorder.  He underwent cardiopulmonary exercise stress test 10/03/21 which showed mild functional impairment when compared to sedentary norms.  No cardiopulmonary abnormality was noted.  It was felt that his limitations were due to body habitus and deconditioning.  His VE/VCO2 slope was elevated and could indicate increased pulmonary pressures however there was hyperventilatory component which played a role in his elevated VE/VCO2 slope.  He underwent cardiac catheterization in 2023 with PCI to his proximal-mid LAD.  He received DES x 1.  He was also noted to have first marginal lesion 20% and second marginal lesion 85% stenosed.  He was placed on dual antiplatelet therapy.  Echocardiogram 8/23 showed LVEF of 55-65%, G1 DD and no significant valvular abnormalities.   He was seen in follow-up by Dr. Wendel on 01/07/2022.  During that time his cardiopulmonary exercise test was reassuring.  He was instructed to continue exercise to help with his deconditioning.  It was recommended that he discuss GLP-1 with his endocrinologist.  His blood pressure was well-controlled.  He was awaiting results from sleep study.  His dual antiplatelet therapy was planned to  be continued through 7/24.  At that time plan was to discontinue aspirin  and continue Plavix  monotherapy.  He presented to the clinic 03/22/22 for follow-up evaluation and stated he was at his endocrinologist earlier that day and his blood pressure was 85/52.  He reported dizziness for the last few weeks.  His dizziness would go away after minutes.  He had not been checking his blood pressure regularly.  He reported that it was normally in the low 100s over 60s-80s.  We reviewed his cardiac catheterization and echocardiogram.  He expressed  understanding.  He reported that he was more physically active, continued to try to lose weight, and was on new medication for weight loss.  I encouraged him to increase his p.o. hydration, change positions slowly, planned follow-up in 1 to 2 months.  I asked him to maintain a blood pressure log.  He presented to the clinic 04/24/22 for follow-up evaluation and stated his blood pressure had been in the 120s-130s.  He brought in an [facility] log which showed an average systolic blood pressure of 129.  He also indicated that his monitor showed him that he had occasional irregular heartbeats.  He did not feel irregular heartbeats.  His weight was improved at 260.2 pounds.  He did notice some dizziness and occasional pulse type sensation in his ears.  He was previously evaluated by neurology and ENT.  I  gave him Epley maneuvers to practice.  Cardiac rehab was recommended.  I will refill his nitroglycerin  and plan follow-up in 6 months.  He followed up with [name] on 09/17/22.  Preoperative cardiac evaluation was completed.  He remained stable from a cardiac standpoint.  His blood pressure was well-controlled.  His aspirin  was discontinued.  He was continued on clopidogrel .  He was seen in the emergency department on 11/02/2021 and discharged on 11/03/2021.  He reported shortness of breath and chest tightness.  He noted worsening shortness of breath earlier in the week as well as nonexertional chest tightness.  His BMP showed a glucose of 302.  His CBC showed slightly elevated white blood cell count.  His cardiac troponins were 4 and 4.  His chest x-ray showed no acute cardiopulmonary findings.  His D-dimer was unremarkable.  Cardiology was consulted.  His EKG showed sinus rhythm 63 bpm.  It was recommended that he follow-up as an outpatient for exercise stress test or cardiac PET stress due to his history of coronary disease with prior PCI.  He presents to the clinic today for follow-up evaluation and states he  continues to have fatigue and shortness of breath.  We reviewed his recent emergency department visit.  He reports that prior to going to the emergency department he went to the walk-in clinic and was told that he had an anomaly on his EKG.  He reports that he just refilled his rosuvastatin  but has not been taking his Plavix .  He previously stopped his aspirin .  He is also not taking metoprolol  or ezetimibe .  I will refill these for 90 days.  I will order cardiac PET/CT for further evaluation and have him follow-up in 2 months.  Today he denies chest pain, shortness of breath, lower extremity edema, palpitations, melena, hematuria, hemoptysis, diaphoresis, weakness, presyncope, syncope, orthopnea, and PND.    Home Medications    Prior to Admission medications   Medication Sig Start Date End Date Taking? Authorizing Provider  aspirin  EC 81 MG tablet Take 81 mg by mouth daily. Swallow whole.    [provider]  cetirizine (ZYRTEC) 10 MG tablet Take 10 mg by mouth as needed for allergies.    [provider]  clopidogrel  (PLAVIX ) 75 MG tablet Take 1 tablet (75 mg total) by mouth daily. 03/14/22   Thukkani, Arun K, MD  ezetimibe  (ZETIA ) 10 MG tablet Take 1 tablet (10 mg total) by mouth daily. 11/20/21   Thukkani, Arun K, MD  fluticasone  (FLONASE ) 50 MCG/ACT nasal spray Place 1 spray into both nostrils daily as needed for allergies.    [provider]  glucose blood test strip 1 each by Other route as needed for other. Use as instructed One Touch Verio (Blood Glucose Test) One Touch Delica Lancets 33G    [provider]  insulin  aspart protamine - aspart (NOVOLOG  MIX 70/30 FLEXPEN) (70-30) 100 UNIT/ML FlexPen Inject 35 Units into the skin 2 (two) times daily with a meal. 07/13/21   [provider]  lisinopril  (PRINIVIL ,ZESTRIL ) 10 MG tablet Take 10 mg by mouth daily. 07/20/15   [provider]  metoprolol  succinate (TOPROL  XL) 25 MG 24 hr tablet Take 0.5  tablets (12.5 mg total) by mouth daily. 03/14/22   Thukkani, Arun K, MD  Multiple Vitamins-Minerals (MULTIVITAMIN WITH MINERALS) tablet Take 1 tablet by mouth daily.    [provider]  nitroGLYCERIN  (NITROSTAT ) 0.4 MG SL tablet Place 1 tablet (0.4 mg total) under the tongue every 5 (five) minutes as needed for chest pain. 08/20/21 11/18/21  Thukkani, Arun K, MD  NOVOLIN 70/30 KWIKPEN (70-30) 100 UNIT/ML KwikPen Inject 35 Units into the skin 2 (two) times daily. 05/15/21   [provider]  omeprazole (PRILOSEC OTC) 20 MG tablet Take 1 tablet by mouth daily as needed (for acid reflux).    [provider]  rosuvastatin  (CRESTOR ) 10 MG tablet Take 1 tablet (10 mg total) by mouth daily. 03/14/22   Thukkani, Arun K, MD  venlafaxine  XR (EFFEXOR -XR) 150 MG 24 hr capsule Take 150 mg by mouth daily. 12/24/21   [provider]  XIGDUO  XR 06-998 MG TB24 Take 1 tablet by mouth 2 (two) times daily. 07/22/21   [provider]    Family History    Family History  Problem Relation Age of Onset   Heart attack Mother 51   Hypertension Father    He indicated that his mother is deceased. He indicated that his father is alive.  Social History    Social History   Socioeconomic History   Marital status: Single    Spouse name: Not on file   Number of children: Not on file   Years of education: Not on file   Highest education level: Not on file  Occupational History   Not on file  Tobacco Use   Smoking status: Never    Passive exposure: Past   Smokeless tobacco: Never  Vaping Use   Vaping status: Never Used  Substance and Sexual Activity   Alcohol  use: Yes    Comment: rare   Drug use: No   Sexual activity: Not on file  Other Topics Concern   Not on file  Social History Narrative   Works Scientist, product/process development support.     Right handed    Social Drivers of Health   Financial Resource Strain: Low Risk  (09/04/2021)   Received from [facility]   Overall Financial  Resource Strain (CARDIA)    Difficulty of Paying Living Expenses: Not hard at all  Food Insecurity: Not on file  Transportation Needs: Not on file  Physical Activity: Not on file  Stress: No Stress Concern Present (09/04/2021)   Received from [facility] Occupational Health - Occupational Stress Questionnaire    Feeling of Stress : Only a little  Social Connections: Unknown (09/04/2021)   Received from [facility]   Social Network    Social Network: Not on file  Intimate Partner Violence: Unknown (09/04/2021)   Received from [facility]   HITS    Physically Hurt: Not on file    Insult or Talk Down To: Not on file    Threaten Physical Harm: Not on file    Scream or Curse: Not on file     Review of Systems    General:  No chills, fever, night sweats or weight changes.  Cardiovascular:  No chest pain, dyspnea on exertion, edema, orthopnea, palpitations, paroxysmal nocturnal dyspnea. Dermatological: No rash, lesions/masses Respiratory: No cough, dyspnea Urologic: No hematuria, dysuria Abdominal:   No nausea, vomiting, diarrhea, bright red blood per rectum, melena, or hematemesis Neurologic:  No visual changes, wkns, changes in mental status. All other systems reviewed and are otherwise negative except as noted above.  Physical Exam    VS:  BP 120/84 (BP Location: Right Arm, Patient Position: Sitting, Cuff Size: Large)   Pulse 88   Ht 5' 9 (1.753 m)   Wt 229 lb (103.9 kg)   SpO2 97%   BMI 33.82 kg/m  , BMI Body mass index is 33.82 kg/m. GEN: Well nourished, well developed, in no acute distress. HEENT: normal. Neck: Supple, no JVD, carotid bruits, or masses. Cardiac: RRR, no murmurs, rubs, or gallops. No clubbing, cyanosis, edema.  Radials/DP/PT 2+ and equal bilaterally.  Respiratory:  Respirations regular and unlabored, clear to auscultation bilaterally. GI: Soft, nontender, nondistended, BS + x 4. MS: no deformity or atrophy. Skin: warm and  dry, no rash. Neuro:  Strength and sensation are intact. Psych: Normal affect.  Accessory Clinical Findings    Recent Labs: 11/03/2023: BUN 12; Creatinine, Ser 1.01; Hemoglobin 14.7; Platelets 272; Potassium 4.8; Sodium 135   Recent Lipid Panel    Component Value Date/Time   CHOL 102 01/07/2022 1117   TRIG 128 01/07/2022 1117   HDL 51 01/07/2022 1117   CHOLHDL 2.0 01/07/2022 1117   LDLCALC 29 01/07/2022 1117         ECG personally reviewed by me today-none today.  Echocardiogram 09/12/2021  IMPRESSIONS     1. Left ventricular ejection fraction, by estimation, is 55 to 60%. The  left ventricle has normal function. The left ventricle has no regional  wall motion abnormalities. There is mild left ventricular hypertrophy.  Left ventricular diastolic parameters  are consistent with Grade I diastolic dysfunction (impaired relaxation).   2. Right ventricular systolic function is normal. The right ventricular  size is normal. Tricuspid regurgitation signal is inadequate for assessing  PA pressure.   3. The mitral valve is normal in structure. No evidence of mitral valve  regurgitation. No evidence of mitral stenosis.   4. The aortic valve is tricuspid. Aortic valve regurgitation is not  visualized. No aortic stenosis is present.   5. The inferior vena cava is normal in size with <50% respiratory  variability, suggesting right atrial pressure of 8 mmHg.   FINDINGS   Left Ventricle: Left ventricular ejection fraction, by estimation, is 55  to 60%. The left ventricle has normal function. The left ventricle has no  regional wall motion abnormalities. The left ventricular internal cavity  size  was normal in size. There is   mild left ventricular hypertrophy. Left ventricular diastolic parameters  are consistent with Grade I diastolic dysfunction (impaired relaxation).   Right Ventricle: The right ventricular size is normal. No increase in  right ventricular wall thickness. Right  ventricular systolic function is  normal. Tricuspid regurgitation signal is inadequate for assessing PA  pressure.   Left Atrium: Left atrial size was normal in size.   Right Atrium: Right atrial size was normal in size.   Pericardium: There is no evidence of pericardial effusion.   Mitral Valve: The mitral valve is normal in structure. No evidence of  mitral valve regurgitation. No evidence of mitral valve stenosis.   Tricuspid Valve: The tricuspid valve is normal in structure. Tricuspid  valve regurgitation is not demonstrated.   Aortic Valve: The aortic valve is tricuspid. Aortic valve regurgitation is  not visualized. No aortic stenosis is present.   Pulmonic Valve: The pulmonic valve was normal in structure. Pulmonic valve  regurgitation is not visualized.   Aorta: The aortic root is normal in size and structure.   Venous: The inferior vena cava is normal in size with less than 50%  respiratory variability, suggesting right atrial pressure of 8 mmHg.   IAS/Shunts: No atrial level shunt detected by color flow Doppler.    Cardiac catheterization 09/11/2021    2nd Mrg lesion is 85% stenosed.   1st Mrg lesion is 20% stenosed.   Mid LAD lesion is 90% stenosed.   A drug-eluting stent was successfully placed.   Post intervention, there is a 0% residual stenosis.   The patient had undergone prior diagnostic catheter[facility] at [facility]) on September 04, 2021.  CD images were reviewed and revealed a long 85 to 90% LAD stenosis in the proximal to mid LAD but not including the large diagonal vessel and he had minimal narrowing in the high marginal vessel with focal 85% stenosis in a very small second marginal branch of the circumflex.  He had a normal dominant RCA. Subsequent surgical consultation with Dr. Fleeta Brooke was done yesterday who felt the patient would be best treated with PCI.  He was admitted last evening with recurrent chest pain and presents now for PCI to his  LAD   Intravascular ultrasound showed mild to moderate calcification with the LAD vessel size at 3.75 mm.   PCI was performed with predilatation with a 2.0 x 20 mm balloon, Score-flex 2.5 x 15 mm cutting balloon with DES stenting with a 3.5 x 24 mm Synergy stent postdilated to 3.75 mm with the stenoses being reduced to 0% and brisk TIMI-3 flow.   RECOMMENDATION: Continue DAPT with aspirin /Brilinta  for minimum of 12 months.  Initiate a retrial of lipid-lowering therapy with the patient's history of previous diarrhea secondary to atorvastatin.  Optimal blood pressure control with target blood pressure less than 130/80 and optimal diabetic management.    Diagnostic Dominance: Right  Intervention      Assessment & Plan   1.Coronary artery disease, chest tightness-no chest pain today.  He reports being very fatigued and having shortness of breath.  Seen and evaluated in the emergency department on 11/03/2023.  He reported increased shortness of breath and chest tightness.  Underwent cardiac catheterization 8/23 with PCI and DES to his proximal-mid LAD.  He is also noted to have 20% first marginal lesion and second marginal lesion 95% stenosis. Continue aspirin ,  rosuvastatin , nitroglycerin  as needed Order cardiac PET/CT    Essential hypertension-BP today 120/84.  Blood pressure log shows average systolic blood pressure in the 129 range over the last month. Continue  metoprolol   Heart healthy low-sodium diet Maintain physical activity  Hyperlipidemia-LDL 131 on 09/05/2023. High-fiber diet Increase physical activity as tolerated Continue rosuvastatin  Resume ezetimibe  Plan for repeat fasting lipids and LFTs in 8 weeks.  Dyspnea-continues with chronic dyspnea.  Previously he underwent cardiopulmonary stress test reassuring. Maintain physical activity Continue weight loss  Elevated BMI-weight today 260.2. Continue weight loss. Increase physical activity as tolerated    Disposition:  Follow-up with Dr.Thukkani  in 2 months.  Josefa HERO. Jah Alarid NP-C     11/14/2023, 3:08 PM [facility] [address] Office [phone] Fax [phone]    I spent 14 minutes examining this patient, reviewing medications, and using patient centered shared decision making involving her cardiac care.  Prior to her visit I spent greater than 20 minutes reviewing her past medical history,  medications, and prior cardiac tests.

## 2023-11-14 ENCOUNTER — Encounter: Payer: Self-pay | Admitting: General Practice

## 2023-11-14 ENCOUNTER — Telehealth: Payer: Self-pay | Admitting: Internal Medicine

## 2023-11-14 ENCOUNTER — Ambulatory Visit: Attending: General Practice | Admitting: General Practice

## 2023-11-14 VITALS — BP 120/84 | HR 88 | Ht 69.0 in | Wt 229.0 lb

## 2023-11-14 DIAGNOSIS — I10 Essential (primary) hypertension: Secondary | ICD-10-CM

## 2023-11-14 DIAGNOSIS — E785 Hyperlipidemia, unspecified: Secondary | ICD-10-CM | POA: Diagnosis not present

## 2023-11-14 DIAGNOSIS — R0600 Dyspnea, unspecified: Secondary | ICD-10-CM

## 2023-11-14 DIAGNOSIS — Z6841 Body Mass Index (BMI) 40.0 and over, adult: Secondary | ICD-10-CM

## 2023-11-14 DIAGNOSIS — I2510 Atherosclerotic heart disease of native coronary artery without angina pectoris: Secondary | ICD-10-CM

## 2023-11-14 MED ORDER — EZETIMIBE 10 MG PO TABS: 10.0000 mg | ORAL_TABLET | Freq: Every day | ORAL | 2 refills | Status: DC

## 2023-11-14 MED ORDER — CLOPIDOGREL BISULFATE 75 MG PO TABS: 75.0000 mg | ORAL_TABLET | Freq: Every day | ORAL | 2 refills | Status: DC

## 2023-11-14 MED ORDER — METOPROLOL SUCCINATE ER 25 MG PO TB24: 12.5000 mg | ORAL_TABLET | Freq: Every day | ORAL | 2 refills | Status: DC

## 2023-11-14 NOTE — Telephone Encounter (Signed)
*  STAT* If patient is at the pharmacy, call can be transferred to refill team.   1. Which medications need to be refilled? (please list name of each medication and dose if known)   clopidogrel  (PLAVIX ) 75 MG tablet  ezetimibe  (ZETIA ) 10 MG tablet  metoprolol  succinate (TOPROL  XL) 25 MG 24 hr tablet   2. Would you like to learn more about the convenience, safety, & potential cost savings by using the [facility]?   3. Are you open to using the [facility] (Type [facility]. ).  4. Which pharmacy/location (including street and city if local pharmacy) is medication to be sent to?  [id number] [address], [place] - [address]   5. Do they need a 30 day or 90 day supply? 90 day  Patient stated he is completely out of these medications. Patient stated he has [facility] to [facility] and [facility].

## 2023-11-14 NOTE — Patient Instructions (Addendum)
 Medication Instructions:  STOP ASPIRIN  Continue clopidogrel  75 mg daily Restart metoprolol  succinate 12.5 mg daily Restart ezetimibe  10 mg daily Continue rosuvastatin  10 mg Take 1 tablet once a day  *If you need a refill on your cardiac medications before your next appointment, please call your pharmacy*  Lab Work: No lab work today. If you have labs (blood work) drawn today and your tests are completely normal, you will receive your results only by: MyChart Message (if you have MyChart) OR A paper copy in the mail If you have any lab test that is abnormal or we need to change your treatment, we will call you to review the results.  Testing/Procedures: Cardiac PET stress test  Follow-Up: At [facility], you and your health needs are our priority.  As part of our continuing mission to provide you with exceptional heart care, our providers are all part of one team.  This team includes your primary Cardiologist (physician) and Advanced Practice Providers or APPs (Physician Assistants and Nurse Practitioners) who all work together to provide you with the care you need, when you need it.  Your next appointment:   2 month(s)  Provider:   Lurena Red, MD or Josefa Beauvais, NP  We recommend signing up for the patient portal called MyChart.  Sign up information is provided on this After Visit Summary.  MyChart is used to connect with patients for Virtual Visits (Telemedicine).  Patients are able to view lab/test results, encounter notes, upcoming appointments, etc.  Non-urgent messages can be sent to your provider as well.   To learn more about what you can do with MyChart, go to [email or web address].   Other Instructions  Maintain heart healthy low-sodium diet  May increase physical activity as tolerated-recommend doing at least some light walking daily     Please report to Radiology at the [facility] Main Entrance 30 minutes early for your test.  [address] [place], [place] [address]                         OR   Please report to Radiology at [facility] Main Entrance, medical mall, 30 mins prior to your test.  [address]  [place], [place]  How to Prepare for Your Cardiac PET/CT Stress Test:  Nothing to eat or drink, except water , 3 hours prior to arrival time.  NO caffeine/decaffeinated products, or chocolate 12 hours prior to arrival. (Please note decaffeinated beverages (teas/coffees) still contain caffeine).  If you have caffeine within 12 hours prior, the test will need to be rescheduled.  Medication instructions: Do not take erectile dysfunction medications for 72 hours prior to test (sildenafil, tadalafil) Do not take nitrates (isosorbide  mononitrate, Ranexa) the day before or day of test Do not take tamsulosin the day before or morning of test Hold theophylline containing medications for 12 hours. Hold Dipyridamole 48 hours prior to the test.  Diabetic Preparation: If able to eat breakfast prior to 3 hour fasting, you may take all medications, including your insulin . Do not worry if you miss your breakfast dose of insulin  - start at your next meal. If you do not eat prior to 3 hour fast-Hold all diabetes (oral and insulin ) medications. Patients who wear a continuous glucose monitor MUST remove the device prior to scanning.  You may take your remaining medications with water .  NO perfume, cologne or lotion on chest or abdomen area. FEMALES - Please avoid  wearing dresses to this appointment.  Total time is 1 to 2 hours; you may want to bring reading material for the waiting time.  IF YOU THINK YOU MAY BE PREGNANT, OR ARE NURSING PLEASE INFORM THE TECHNOLOGIST.  In preparation for your appointment, medication and supplies will be purchased.  Appointment availability is limited, so if you need to cancel or reschedule, please call the Radiology Department Scheduler at [phone] 24 hours in  advance to avoid a cancellation fee of $100.00  What to Expect When you Arrive:  Once you arrive and check in for your appointment, you will be taken to a preparation room within the Radiology Department.  A technologist or Nurse will obtain your medical history, verify that you are correctly prepped for the exam, and explain the procedure.  Afterwards, an IV will be started in your arm and electrodes will be placed on your skin for EKG monitoring during the stress portion of the exam. Then you will be escorted to the PET/CT scanner.  There, staff will get you positioned on the scanner and obtain a blood pressure and EKG.  During the exam, you will continue to be connected to the EKG and blood pressure machines.  A small, safe amount of a radioactive tracer will be injected in your IV to obtain a series of pictures of your heart along with an injection of a stress agent.    After your Exam:  It is recommended that you eat a meal and drink a caffeinated beverage to counter act any effects of the stress agent.  Drink plenty of fluids for the remainder of the day and urinate frequently for the first couple of hours after the exam.  Your doctor will inform you of your test results within 7-10 business days.  For more information and frequently asked questions, please visit our website: [email or web address]  For questions about your test or how to prepare for your test, please call: Cardiac Imaging Nurse Navigators Office: [phone]

## 2023-11-17 MED ORDER — CLOPIDOGREL BISULFATE 75 MG PO TABS: 75.0000 mg | ORAL_TABLET | Freq: Every day | ORAL | 3 refills | Status: AC

## 2023-11-17 MED ORDER — METOPROLOL SUCCINATE ER 25 MG PO TB24: 12.5000 mg | ORAL_TABLET | Freq: Every day | ORAL | 3 refills | Status: AC

## 2023-11-17 MED ORDER — EZETIMIBE 10 MG PO TABS: 10.0000 mg | ORAL_TABLET | Freq: Every day | ORAL | 3 refills | Status: AC

## 2023-11-17 NOTE — Telephone Encounter (Signed)
 RX sent in

## 2023-12-05 ENCOUNTER — Other Ambulatory Visit: Payer: Self-pay | Admitting: General Practice

## 2023-12-19 ENCOUNTER — Encounter (HOSPITAL_COMMUNITY): Payer: Self-pay

## 2023-12-19 ENCOUNTER — Other Ambulatory Visit (HOSPITAL_COMMUNITY): Payer: Self-pay | Admitting: *Deleted

## 2023-12-19 DIAGNOSIS — I25709 Atherosclerosis of coronary artery bypass graft(s), unspecified, with unspecified angina pectoris: Secondary | ICD-10-CM

## 2023-12-23 ENCOUNTER — Ambulatory Visit (HOSPITAL_COMMUNITY)
Admission: RE | Admit: 2023-12-23 | Discharge: 2023-12-23 | Disposition: A | Discharge status: Home / Self Care | Source: Ambulatory Visit | Attending: General Practice | Admitting: General Practice

## 2023-12-23 ENCOUNTER — Ambulatory Visit: Payer: Self-pay | Admitting: General Practice

## 2023-12-23 DIAGNOSIS — I10 Essential (primary) hypertension: Secondary | ICD-10-CM | POA: Diagnosis present

## 2023-12-23 DIAGNOSIS — I2510 Atherosclerotic heart disease of native coronary artery without angina pectoris: Secondary | ICD-10-CM | POA: Diagnosis present

## 2023-12-23 DIAGNOSIS — Z6841 Body Mass Index (BMI) 40.0 and over, adult: Secondary | ICD-10-CM | POA: Insufficient documentation

## 2023-12-23 DIAGNOSIS — R0600 Dyspnea, unspecified: Secondary | ICD-10-CM | POA: Diagnosis present

## 2023-12-23 DIAGNOSIS — E785 Hyperlipidemia, unspecified: Secondary | ICD-10-CM | POA: Insufficient documentation

## 2023-12-23 LAB — NM PET CT CARDIAC PERFUSION MULTI W/ABSOLUTE BLOODFLOW
LV dias vol: 80 mL (ref 62–150)
LV sys vol: 31 mL (ref 4.2–5.8)
MBFR: 2.89
Nuc Rest EF: 61 %
Nuc Stress EF: 63 %
Rest MBF: 0.64 ml/g/min
Rest Nuclear Isotope Dose: 26.4 mCi
ST Depression (mm): 0 mm
Stress MBF: 1.85 ml/g/min
Stress Nuclear Isotope Dose: 26.4 mCi

## 2023-12-23 MED ORDER — REGADENOSON 0.4 MG/5ML IV SOLN
INTRAVENOUS | Status: AC
  Filled 2023-12-23: qty 5

## 2023-12-23 MED ORDER — REGADENOSON 0.4 MG/5ML IV SOLN
0.4000 mg | Freq: Once | INTRAVENOUS | Status: AC
  Administered 2023-12-23: 0.4 mg via INTRAVENOUS

## 2023-12-23 MED ORDER — RUBIDIUM RB82 GENERATOR (RUBYFILL): 26.4000 | PACK | Freq: Once | INTRAVENOUS | Status: DC

## 2023-12-23 NOTE — Progress Notes (Signed)
 Pt tolerated lexiscan. Endorsed SOB at start of scan which quickly subsided with minor HA at conclusion. No other symptoms during or after. Given PO caffeine. Able to ambulate independently.

## 2024-01-12 NOTE — Progress Notes (Unsigned)
 Cardiology Clinic Note   Patient Name: Michael Sandoval Date of Encounter: 01/14/2024  Primary Care Provider:  Kip Righter, MD Primary Cardiologist:  Arun K Thukkani, MD  Patient Profile    Michael Sandoval 58 year old male presents the clinic today for follow-up evaluation of his chest pain.  Past Medical History    Past Medical History:  Diagnosis Date   Anxiety    Arthritis    Cancer (HCC)    melanoma skin cancer    Daytime somnolence    Decreased hearing    Depression    Diabetes mellitus, type II (HCC)        Dyslipidemia    Elevated blood pressure reading    Elevated coronary artery calcium  score    Excessive daytime sleepiness    GERD (gastroesophageal reflux disease)    HTN (hypertension)    HTN (hypertension)    Hyperlipidemia    Lightheadedness    Non-restorative sleep    Obesity    Osteoarthritis of hip    Pneumonia    1990    Sinusitis    Unspecified disorder of nose and nasal sinuses    Past Surgical History:  Procedure Laterality Date   colonoscopy   2006   CORONARY STENT INTERVENTION N/A 09/11/2021   Procedure: CORONARY STENT INTERVENTION;  Surgeon: Burnard Debby LABOR, MD;  Location: [facility] INVASIVE CV LAB;  Service: Cardiovascular;  Laterality: N/A;   CORONARY ULTRASOUND/IVUS N/A 09/11/2021   Procedure: Intravascular Ultrasound/IVUS;  Surgeon: Burnard Debby LABOR, MD;  Location: [facility] INVASIVE CV LAB;  Service: Cardiovascular;  Laterality: N/A;   HAND SURGERY     crush injury , small screw in place per surgeon    skin cancer   several years ago   TONSILLECTOMY AND ADENOIDECTOMY     TOTAL HIP ARTHROPLASTY Right 07/09/2018   Procedure: TOTAL HIP ARTHROPLASTY ANTERIOR APPROACH;  Surgeon: Fidel Rogue, MD;  Location: [facility] ORS;  Service: Orthopedics;  Laterality: Right;    Allergies  Allergies  Allergen Reactions   Onion Diarrhea   Atorvastatin Diarrhea   Capsicum Diarrhea and Nausea Only    Bell peppers   Lactose Intolerance (Gi) Diarrhea    History  of Present Illness    Michael Sandoval has a PMH of HTN, NSTEMI, chronic sinusitis, GERD, type 2 diabetes, right hand fracture, daytime somnolence, hyperlipidemia, morbid obesity, anxiety, depression, and sleep disorder.  He underwent cardiopulmonary exercise stress test 10/03/21 which showed mild functional impairment when compared to sedentary norms.  No cardiopulmonary abnormality was noted.  It was felt that his limitations were due to body habitus and deconditioning.  His VE/VCO2 slope was elevated and could indicate increased pulmonary pressures however there was hyperventilatory component which played a role in his elevated VE/VCO2 slope.  He underwent cardiac catheterization in 2023 with PCI to his proximal-mid LAD.  He received DES x 1.  He was also noted to have first marginal lesion 20% and second marginal lesion 85% stenosed.  He was placed on dual antiplatelet therapy.  Echocardiogram 8/23 showed LVEF of 55-65%, G1 DD and no significant valvular abnormalities.   He was seen in follow-up by Dr. Wendel on 01/07/2022.  During that time his cardiopulmonary exercise test was reassuring.  He was instructed to continue exercise to help with his deconditioning.  It was recommended that he discuss GLP-1 with his endocrinologist.  His blood pressure was well-controlled.  He was awaiting results from sleep study.  His dual antiplatelet therapy was planned to  be continued through 7/24.  At that time plan was to discontinue aspirin  and continue Plavix  monotherapy.  He presented to the clinic 03/22/22 for follow-up evaluation and stated he was at his endocrinologist earlier that day and his blood pressure was 85/52.  He reported dizziness for the last few weeks.  His dizziness would go away after minutes.  He had not been checking his blood pressure regularly.  He reported that it was normally in the low 100s over 60s-80s.  We reviewed his cardiac catheterization and echocardiogram.  He expressed  understanding.  He reported that he was more physically active, continued to try to lose weight, and was on new medication for weight loss.  I encouraged him to increase his p.o. hydration, change positions slowly, planned follow-up in 1 to 2 months.  I asked him to maintain a blood pressure log.  He presented to the clinic 04/24/22 for follow-up evaluation and stated his blood pressure had been in the 120s-130s.  He brought in an [employer] log which showed an average systolic blood pressure of 129.  He also indicated that his monitor showed him that he had occasional irregular heartbeats.  He did not feel irregular heartbeats.  His weight was improved at 260.2 pounds.  He did notice some dizziness and occasional pulse type sensation in his ears.  He was previously evaluated by neurology and ENT.  I  gave him Epley maneuvers to practice.  Cardiac rehab was recommended.  I will refill his nitroglycerin  and plan follow-up in 6 months.  He followed up with [name] on 09/17/22.  Preoperative cardiac evaluation was completed.  He remained stable from a cardiac standpoint.  His blood pressure was well-controlled.  His aspirin  was discontinued.  He was continued on clopidogrel .  He was seen in the emergency department on 11/02/2021 and discharged on 11/03/2021.  He reported shortness of breath and chest tightness.  He noted worsening shortness of breath earlier in the week as well as nonexertional chest tightness.  His BMP showed a glucose of 302.  His CBC showed slightly elevated white blood cell count.  His cardiac troponins were 4 and 4.  His chest x-ray showed no acute cardiopulmonary findings.  His D-dimer was unremarkable.  Cardiology was consulted.  His EKG showed sinus rhythm 63 bpm.  It was recommended that he follow-up as an outpatient for exercise stress test or cardiac PET stress due to his history of coronary disease with prior PCI.  He presented to the clinic 11/14/23 for follow-up evaluation and stated he  continued to have fatigue and shortness of breath.  We reviewed his recent emergency department visit.  He reported that prior to going to the emergency department he went to the walk-in clinic and was told that he had an anomaly on his EKG.  He reported that he just refilled his rosuvastatin  but had not been taking his Plavix .  He previously stopped his aspirin .  He was also not taking metoprolol  or ezetimibe .  I  refilled these for 90 days.  I  ordered cardiac PET/CT for further evaluation and have him follow-up in 2 months.  His cardiac PET/CT 12/23/2023 showed normal LV function and no evidence of ischemia.  His EF was noted to be 61% his study was felt to be low risk.  He presents to the clinic today for follow-up evaluation and states over the last few weeks he has felt fairly well.  He has been more active.  He does note that on  Sunday and Monday he did not feel as well.  He notes that he did have some mild congestion.  He has been monitoring his blood sugar more closely.  He continues to lose weight.  We reviewed his cardiac PET/CT.  He expressed understanding.  He wonders why he is feeling chest tightness and lightheadedness recently.  He plans to follow-up with his endocrinologist.  We reviewed this and I feel that it is a appropriate next step.  I will continue his current medication regimen and order direct LDL as well as a thyroid  panel.  We will plan follow-up in 6 months.  Today he denies chest pain, shortness of breath, lower extremity edema, palpitations, melena, hematuria, hemoptysis, diaphoresis, weakness, presyncope, syncope, orthopnea, and PND.    Home Medications    Prior to Admission medications   Medication Sig Start Date End Date Taking? Authorizing Provider  aspirin  EC 81 MG tablet Take 81 mg by mouth daily. Swallow whole.    [provider]  cetirizine (ZYRTEC) 10 MG tablet Take 10 mg by mouth as needed for allergies.    [provider]  clopidogrel   (PLAVIX ) 75 MG tablet Take 1 tablet (75 mg total) by mouth daily. 03/14/22   Thukkani, Arun K, MD  ezetimibe  (ZETIA ) 10 MG tablet Take 1 tablet (10 mg total) by mouth daily. 11/20/21   Thukkani, Arun K, MD  fluticasone  (FLONASE ) 50 MCG/ACT nasal spray Place 1 spray into both nostrils daily as needed for allergies.    [provider]  glucose blood test strip 1 each by Other route as needed for other. Use as instructed One Touch Verio (Blood Glucose Test) One Touch Delica Lancets 33G    [provider]  insulin  aspart protamine - aspart (NOVOLOG  MIX 70/30 FLEXPEN) (70-30) 100 UNIT/ML FlexPen Inject 35 Units into the skin 2 (two) times daily with a meal. 07/13/21   [provider]  lisinopril  (PRINIVIL ,ZESTRIL ) 10 MG tablet Take 10 mg by mouth daily. 07/20/15   [provider]  metoprolol  succinate (TOPROL  XL) 25 MG 24 hr tablet Take 0.5 tablets (12.5 mg total) by mouth daily. 03/14/22   Thukkani, Arun K, MD  Multiple Vitamins-Minerals (MULTIVITAMIN WITH MINERALS) tablet Take 1 tablet by mouth daily.    [provider]  nitroGLYCERIN  (NITROSTAT ) 0.4 MG SL tablet Place 1 tablet (0.4 mg total) under the tongue every 5 (five) minutes as needed for chest pain. 08/20/21 11/18/21  Thukkani, Arun K, MD  NOVOLIN 70/30 KWIKPEN (70-30) 100 UNIT/ML KwikPen Inject 35 Units into the skin 2 (two) times daily. 05/15/21   [provider]  omeprazole (PRILOSEC OTC) 20 MG tablet Take 1 tablet by mouth daily as needed (for acid reflux).    [provider]  rosuvastatin  (CRESTOR ) 10 MG tablet Take 1 tablet (10 mg total) by mouth daily. 03/14/22   Thukkani, Arun K, MD  venlafaxine  XR (EFFEXOR -XR) 150 MG 24 hr capsule Take 150 mg by mouth daily. 12/24/21   [provider]  XIGDUO  XR 06-998 MG TB24 Take 1 tablet by mouth 2 (two) times daily. 07/22/21   [provider]    Family History    Family History  Problem Relation Age of Onset   Heart attack  Mother 3   Hypertension Father    He indicated that his mother is deceased. He indicated that his father is alive.  Social History    Social History   Socioeconomic History   Marital status: Single    Spouse  name: Not on file   Number of children: Not on file   Years of education: Not on file   Highest education level: Not on file  Occupational History   Not on file  Tobacco Use   Smoking status: Never    Passive exposure: Past   Smokeless tobacco: Never  Vaping Use   Vaping status: Never Used  Substance and Sexual Activity   Alcohol  use: Yes    Comment: rare   Drug use: No   Sexual activity: Not on file  Other Topics Concern   Not on file  Social History Narrative   Works scientist, product/process development support.     Right handed    Social Drivers of Health   Financial Resource Strain: Low Risk  (09/04/2021)   Received from [facility]   Overall Financial Resource Strain (CARDIA)    Difficulty of Paying Living Expenses: Not hard at all  Food Insecurity: Not on file  Transportation Needs: Not on file  Physical Activity: Not on file  Stress: No Stress Concern Present (09/04/2021)   Received from [facility] of Occupational Health - Occupational Stress Questionnaire    Feeling of Stress : Only a little  Social Connections: Not on file  Intimate Partner Violence: Not on file     Review of Systems    General:  No chills, fever, night sweats or weight changes.  Cardiovascular:  No chest pain, dyspnea on exertion, edema, orthopnea, palpitations, paroxysmal nocturnal dyspnea. Dermatological: No rash, lesions/masses Respiratory: No cough, dyspnea Urologic: No hematuria, dysuria Abdominal:   No nausea, vomiting, diarrhea, bright red blood per rectum, melena, or hematemesis Neurologic:  No visual changes, wkns, changes in mental status. All other systems reviewed and are otherwise negative except as noted above.  Physical Exam    VS:  BP 122/68   Pulse 97   Ht  5' 9 (1.753 m)   Wt 228 lb (103.4 kg)   SpO2 98%   BMI 33.67 kg/m  , BMI Body mass index is 33.67 kg/m. GEN: Well nourished, well developed, in no acute distress. HEENT: normal. Neck: Supple, no JVD, carotid bruits, or masses. Cardiac: RRR, no murmurs, rubs, or gallops. No clubbing, cyanosis, edema.  Radials/DP/PT 2+ and equal bilaterally.  Respiratory:  Respirations regular and unlabored, clear to auscultation bilaterally. GI: Soft, nontender, nondistended, BS + x 4. MS: no deformity or atrophy. Skin: warm and dry, no rash. Neuro:  Strength and sensation are intact. Psych: Normal affect.  Accessory Clinical Findings    Recent Labs: 11/03/2023: BUN 12; Creatinine, Ser 1.01; Hemoglobin 14.7; Platelets 272; Potassium 4.8; Sodium 135   Recent Lipid Panel    Component Value Date/Time   CHOL 102 01/07/2022 1117   TRIG 128 01/07/2022 1117   HDL 51 01/07/2022 1117   CHOLHDL 2.0 01/07/2022 1117   LDLCALC 29 01/07/2022 1117         ECG personally reviewed by me today-none today.  Echocardiogram 09/12/2021  IMPRESSIONS     1. Left ventricular ejection fraction, by estimation, is 55 to 60%. The  left ventricle has normal function. The left ventricle has no regional  wall motion abnormalities. There is mild left ventricular hypertrophy.  Left ventricular diastolic parameters  are consistent with Grade I diastolic dysfunction (impaired relaxation).   2. Right ventricular systolic function is normal. The right ventricular  size is normal. Tricuspid regurgitation signal is inadequate for assessing  PA pressure.   3. The mitral valve is normal in  structure. No evidence of mitral valve  regurgitation. No evidence of mitral stenosis.   4. The aortic valve is tricuspid. Aortic valve regurgitation is not  visualized. No aortic stenosis is present.   5. The inferior vena cava is normal in size with <50% respiratory  variability, suggesting right atrial pressure of 8 mmHg.   FINDINGS    Left Ventricle: Left ventricular ejection fraction, by estimation, is 55  to 60%. The left ventricle has normal function. The left ventricle has no  regional wall motion abnormalities. The left ventricular internal cavity  size was normal in size. There is   mild left ventricular hypertrophy. Left ventricular diastolic parameters  are consistent with Grade I diastolic dysfunction (impaired relaxation).   Right Ventricle: The right ventricular size is normal. No increase in  right ventricular wall thickness. Right ventricular systolic function is  normal. Tricuspid regurgitation signal is inadequate for assessing PA  pressure.   Left Atrium: Left atrial size was normal in size.   Right Atrium: Right atrial size was normal in size.   Pericardium: There is no evidence of pericardial effusion.   Mitral Valve: The mitral valve is normal in structure. No evidence of  mitral valve regurgitation. No evidence of mitral valve stenosis.   Tricuspid Valve: The tricuspid valve is normal in structure. Tricuspid  valve regurgitation is not demonstrated.   Aortic Valve: The aortic valve is tricuspid. Aortic valve regurgitation is  not visualized. No aortic stenosis is present.   Pulmonic Valve: The pulmonic valve was normal in structure. Pulmonic valve  regurgitation is not visualized.   Aorta: The aortic root is normal in size and structure.   Venous: The inferior vena cava is normal in size with less than 50%  respiratory variability, suggesting right atrial pressure of 8 mmHg.   IAS/Shunts: No atrial level shunt detected by color flow Doppler.    Cardiac catheterization 09/11/2021    2nd Mrg lesion is 85% stenosed.   1st Mrg lesion is 20% stenosed.   Mid LAD lesion is 90% stenosed.   A drug-eluting stent was successfully placed.   Post intervention, there is a 0% residual stenosis.   The patient had undergone prior diagnostic catheterization at [facility]) on September 04, 2021.  CD images were reviewed and revealed a long 85 to 90% LAD stenosis in the proximal to mid LAD but not including the large diagonal vessel and he had minimal narrowing in the high marginal vessel with focal 85% stenosis in a very small second marginal branch of the circumflex.  He had a normal dominant RCA. Subsequent surgical consultation with Dr. Fleeta Brooke was done yesterday who felt the patient would be best treated with PCI.  He was admitted last evening with recurrent chest pain and presents now for PCI to his LAD   Intravascular ultrasound showed mild to moderate calcification with the LAD vessel size at 3.75 mm.   PCI was performed with predilatation with a 2.0 x 20 mm balloon, Score-flex 2.5 x 15 mm cutting balloon with DES stenting with a 3.5 x 24 mm Synergy stent postdilated to 3.75 mm with the stenoses being reduced to 0% and brisk TIMI-3 flow.   RECOMMENDATION: Continue DAPT with aspirin /Brilinta  for minimum of 12 months.  Initiate a retrial of lipid-lowering therapy with the patient's history of previous diarrhea secondary to atorvastatin.  Optimal blood pressure control with target blood pressure less than 130/80 and optimal diabetic management.    Diagnostic Dominance: Right  Intervention   Cardiac PET/CT 12/23/2023  Study Result   LV perfusion is normal. There is no evidence of ischemia. There is no evidence of infarction.  Rest left ventricular function is normal. Rest EF: 61%. Stress left ventricular function is normal. Stress EF: 63%. End diastolic cavity size is normal. End systolic cavity size is normal.  Myocardial blood flow was computed to be 0.76ml/g/min at rest and 1.9ml/g/min at stress. Global myocardial blood flow reserve was 2.89 and was normal.  Coronary calcium  assessment not performed due to prior revascularization.Prior stent to LAD  The study is normal. The study is low risk.  EXAM: OVER-READ INTERPRETATION CT CHEST  The following report is a  limited chest CT over-read performed by radiologist Dr. Elsie Ko [facility] on 12/23/2023. This over-read does not include interpretation of cardiac or coronary anatomy or pathology nor does it include evaluation of the PET data. The cardiac PET-CT interpretation by the cardiologist is attached.  COMPARISON: Chest radiographs 11/03/2023. Cardiac CT 08/17/2015.  FINDINGS: Mediastinum/Nodes: No enlarged lymph nodes within the visualized mediastinum.Aortic and coronary artery atherosclerosis with probable new left anterior descending coronary artery stent.  Lungs/Pleura: There is no pleural effusion. The visualized lungs appear clear.  Upper abdomen: No significant findings in the visualized upper abdomen.  Musculoskeletal/Chest wall: No chest wall mass or suspicious osseous findings within the visualized chest on axial imaging. Mild spondylosis noted.  IMPRESSION: 1. No significant extracardiac findings within the visualized chest. 2. Aortic Atherosclerosis (ICD10-I70.0).   Electronically Signed By: Elsie Perone M.D. On: 12/23/2023 09:06  Assessment & Plan   1.Coronary artery disease, chest tightness-describes mild chest tightness Sunday and Monday.  Cardiac PET/CT reassuring.  Details above.  He previously reported being very fatigued and having shortness of breath.  Seen and evaluated in the emergency department on 11/03/2023.  He underwent cardiac catheterization 8/23 with PCI and DES to his proximal-mid LAD.  He is also noted to have 20% first marginal lesion and second marginal lesion 95% stenosis. Continue aspirin,  rosuvastatin, nitroglycerin as needed No plans for ischemic evaluation Follow-up with endocrinology Order thyroid panel    Essential hypertension-BP today 122/68 Maintain blood pressure log Continue  metoprolol  Heart healthy low-sodium diet Maintain physical activity  Hyperlipidemia-LDL 131 on 09/05/2023. High-fiber diet Increase  physical activity as tolerated Continue rosuvastatin, ezetimibe Order direct LDL  Dyspnea-denies shortness of breath.  Previously he underwent cardiopulmonary stress test reassuring. Maintain physical activity Continue weight loss     Disposition: Follow-up with Dr.Thukkani  in 4-6 months.  Keerat Denicola M. Brennen Gardiner NP-C     12 /04/2023, 9:34 AM [facility] [address] Office [phone] Fax [phone]    I spent 14 minutes examining this patient, reviewing medications, and using patient centered shared decision making involving her cardiac care.  Prior to her visit I spent greater than 20 minutes reviewing her past medical history,  medications, and prior cardiac tests.

## 2024-01-14 ENCOUNTER — Ambulatory Visit: Attending: Cardiology | Admitting: General Practice

## 2024-01-14 ENCOUNTER — Encounter: Payer: Self-pay | Admitting: General Practice

## 2024-01-14 VITALS — BP 122/68 | HR 97 | Ht 69.0 in | Wt 228.0 lb

## 2024-01-14 DIAGNOSIS — E785 Hyperlipidemia, unspecified: Secondary | ICD-10-CM | POA: Diagnosis not present

## 2024-01-14 DIAGNOSIS — I25709 Atherosclerosis of coronary artery bypass graft(s), unspecified, with unspecified angina pectoris: Secondary | ICD-10-CM

## 2024-01-14 DIAGNOSIS — I10 Essential (primary) hypertension: Secondary | ICD-10-CM | POA: Diagnosis not present

## 2024-01-14 DIAGNOSIS — R0600 Dyspnea, unspecified: Secondary | ICD-10-CM | POA: Diagnosis not present

## 2024-01-14 NOTE — Patient Instructions (Signed)
 Medication Instructions:  Your physician recommends that you continue on your current medications as directed. Please refer to the Current Medication list given to you today.  *If you need a refill on your cardiac medications before your next appointment, please call your pharmacy*  Lab Work: Today- Thyroid  panel, Direct LDL If you have labs (blood work) drawn today and your tests are completely normal, you will receive your results only by: MyChart Message (if you have MyChart) OR A paper copy in the mail If you have any lab test that is abnormal or we need to change your treatment, we will call you to review the results.   Follow-Up: At [facility], you and your health needs are our priority.  As part of our continuing mission to provide you with exceptional heart care, our providers are all part of one team.  This team includes your primary Cardiologist (physician) and Advanced Practice Providers or APPs (Physician Assistants and Nurse Practitioners) who all work together to provide you with the care you need, when you need it.  Your next appointment:   6 month(s)  Provider:   Arun K Thukkani, MD or Josefa Beauvais, NP          We recommend signing up for the patient portal called MyChart.  Sign up information is provided on this After Visit Summary.  MyChart is used to connect with patients for Virtual Visits (Telemedicine).  Patients are able to view lab/test results, encounter notes, upcoming appointments, etc.  Non-urgent messages can be sent to your provider as well.   To learn more about what you can do with MyChart, go to [email or web address].   Other Instructions Please maintain physical activity

## 2024-01-15 ENCOUNTER — Ambulatory Visit: Payer: Self-pay | Admitting: General Practice

## 2024-01-15 LAB — THYROID PANEL WITH TSH
Free Thyroxine Index: 1.3 (ref 1.2–4.9)
T3 Uptake Ratio: 22 % — ABNORMAL LOW (ref 24–39)
T4, Total: 6 ug/dL (ref 4.5–12.0)
TSH: 3.26 u[IU]/mL (ref 0.450–4.500)

## 2024-01-15 LAB — LDL CHOLESTEROL, DIRECT: LDL Direct: 28 mg/dL (ref 0–99)

## 2024-02-02 ENCOUNTER — Other Ambulatory Visit (HOSPITAL_BASED_OUTPATIENT_CLINIC_OR_DEPARTMENT_OTHER): Payer: Self-pay

## 2024-02-02 MED ORDER — COMIRNATY 30 MCG/0.3ML IM SUSY
0.3000 mL | PREFILLED_SYRINGE | Freq: Once | INTRAMUSCULAR | 0 refills | Status: AC
  Filled 2024-02-02: qty 0.3, 1d supply, fill #0

## 2024-02-02 MED ORDER — FLUZONE 0.5 ML IM SUSY
0.5000 mL | PREFILLED_SYRINGE | Freq: Once | INTRAMUSCULAR | 0 refills | Status: AC
  Filled 2024-02-02: qty 0.5, 1d supply, fill #0
